# Patient Record
Sex: Female | Born: 1970 | Race: Black or African American | Hispanic: No | Marital: Single | Smoking: Former smoker
Health system: Southern US, Community
[De-identification: ages and names within clinical notes are randomized; demographics above are authoritative.]

## PROBLEM LIST (undated history)

## (undated) DIAGNOSIS — N760 Acute vaginitis: Secondary | ICD-10-CM

## (undated) DIAGNOSIS — A599 Trichomoniasis, unspecified: Secondary | ICD-10-CM

## (undated) DIAGNOSIS — F419 Anxiety disorder, unspecified: Secondary | ICD-10-CM

## (undated) DIAGNOSIS — J302 Other seasonal allergic rhinitis: Secondary | ICD-10-CM

## (undated) DIAGNOSIS — B3731 Acute candidiasis of vulva and vagina: Secondary | ICD-10-CM

## (undated) DIAGNOSIS — F32A Depression, unspecified: Secondary | ICD-10-CM

## (undated) DIAGNOSIS — F329 Major depressive disorder, single episode, unspecified: Secondary | ICD-10-CM

## (undated) DIAGNOSIS — K259 Gastric ulcer, unspecified as acute or chronic, without hemorrhage or perforation: Secondary | ICD-10-CM

## (undated) DIAGNOSIS — B9689 Other specified bacterial agents as the cause of diseases classified elsewhere: Secondary | ICD-10-CM

## (undated) DIAGNOSIS — I10 Essential (primary) hypertension: Secondary | ICD-10-CM

## (undated) DIAGNOSIS — B373 Candidiasis of vulva and vagina: Secondary | ICD-10-CM

## (undated) HISTORY — DX: Gastric ulcer, unspecified as acute or chronic, without hemorrhage or perforation: K25.9

## (undated) HISTORY — PX: OTHER SURGICAL HISTORY: SHX169

## (undated) HISTORY — PX: TUBAL LIGATION: SHX77

---

## 1998-03-07 ENCOUNTER — Encounter: Admission: RE | Admit: 1998-03-07 | Discharge: 1998-03-07 | Payer: Self-pay | Admitting: Obstetrics & Gynecology

## 1998-07-17 ENCOUNTER — Emergency Department (HOSPITAL_COMMUNITY): Admission: EM | Admit: 1998-07-17 | Discharge: 1998-07-17 | Payer: Self-pay | Admitting: Emergency Medicine

## 1998-07-17 ENCOUNTER — Encounter: Payer: Self-pay | Admitting: Emergency Medicine

## 1998-09-21 ENCOUNTER — Encounter: Admission: RE | Admit: 1998-09-21 | Discharge: 1998-09-21 | Payer: Self-pay | Admitting: Obstetrics

## 1999-03-20 ENCOUNTER — Encounter: Admission: RE | Admit: 1999-03-20 | Discharge: 1999-03-20 | Payer: Self-pay | Admitting: Obstetrics & Gynecology

## 1999-08-30 ENCOUNTER — Emergency Department (HOSPITAL_COMMUNITY): Admission: EM | Admit: 1999-08-30 | Discharge: 1999-08-30 | Payer: Self-pay | Admitting: Emergency Medicine

## 1999-08-30 ENCOUNTER — Encounter: Payer: Self-pay | Admitting: Emergency Medicine

## 1999-09-05 ENCOUNTER — Encounter: Admission: RE | Admit: 1999-09-05 | Discharge: 1999-09-05 | Payer: Self-pay | Admitting: Internal Medicine

## 1999-09-12 ENCOUNTER — Encounter: Admission: RE | Admit: 1999-09-12 | Discharge: 1999-09-12 | Payer: Self-pay | Admitting: Internal Medicine

## 2000-03-02 ENCOUNTER — Emergency Department (HOSPITAL_COMMUNITY): Admission: EM | Admit: 2000-03-02 | Discharge: 2000-03-02 | Payer: Self-pay | Admitting: *Deleted

## 2000-06-03 ENCOUNTER — Encounter: Admission: RE | Admit: 2000-06-03 | Discharge: 2000-06-03 | Payer: Self-pay | Admitting: Obstetrics & Gynecology

## 2000-07-29 ENCOUNTER — Encounter: Admission: RE | Admit: 2000-07-29 | Discharge: 2000-07-29 | Payer: Self-pay | Admitting: Obstetrics & Gynecology

## 2000-07-29 ENCOUNTER — Other Ambulatory Visit: Admission: RE | Admit: 2000-07-29 | Discharge: 2000-07-29 | Payer: Self-pay | Admitting: Obstetrics & Gynecology

## 2000-10-23 ENCOUNTER — Encounter: Admission: RE | Admit: 2000-10-23 | Discharge: 2000-10-23 | Payer: Self-pay | Admitting: Hematology and Oncology

## 2001-08-18 ENCOUNTER — Encounter: Payer: Self-pay | Admitting: Emergency Medicine

## 2001-08-18 ENCOUNTER — Emergency Department (HOSPITAL_COMMUNITY): Admission: EM | Admit: 2001-08-18 | Discharge: 2001-08-18 | Payer: Self-pay | Admitting: Emergency Medicine

## 2001-08-25 ENCOUNTER — Encounter: Admission: RE | Admit: 2001-08-25 | Discharge: 2001-08-25 | Payer: Self-pay | Admitting: *Deleted

## 2001-08-25 ENCOUNTER — Other Ambulatory Visit: Admission: RE | Admit: 2001-08-25 | Discharge: 2001-08-25 | Payer: Self-pay

## 2001-08-25 ENCOUNTER — Encounter (INDEPENDENT_AMBULATORY_CARE_PROVIDER_SITE_OTHER): Payer: Self-pay | Admitting: *Deleted

## 2001-12-10 ENCOUNTER — Encounter (INDEPENDENT_AMBULATORY_CARE_PROVIDER_SITE_OTHER): Payer: Self-pay | Admitting: *Deleted

## 2001-12-10 ENCOUNTER — Other Ambulatory Visit: Admission: RE | Admit: 2001-12-10 | Discharge: 2001-12-10 | Payer: Self-pay | Admitting: Obstetrics and Gynecology

## 2001-12-10 ENCOUNTER — Encounter: Admission: RE | Admit: 2001-12-10 | Discharge: 2001-12-10 | Payer: Self-pay | Admitting: *Deleted

## 2001-12-31 ENCOUNTER — Encounter: Admission: RE | Admit: 2001-12-31 | Discharge: 2001-12-31 | Payer: Self-pay | Admitting: Obstetrics and Gynecology

## 2002-02-16 ENCOUNTER — Ambulatory Visit (HOSPITAL_COMMUNITY): Admission: RE | Admit: 2002-02-16 | Discharge: 2002-02-16 | Payer: Self-pay | Admitting: *Deleted

## 2002-02-16 ENCOUNTER — Encounter (INDEPENDENT_AMBULATORY_CARE_PROVIDER_SITE_OTHER): Payer: Self-pay | Admitting: Specialist

## 2002-03-11 ENCOUNTER — Encounter: Admission: RE | Admit: 2002-03-11 | Discharge: 2002-03-11 | Payer: Self-pay | Admitting: Obstetrics and Gynecology

## 2002-06-10 ENCOUNTER — Other Ambulatory Visit: Admission: RE | Admit: 2002-06-10 | Discharge: 2002-06-10 | Payer: Self-pay | Admitting: *Deleted

## 2002-06-10 ENCOUNTER — Encounter: Admission: RE | Admit: 2002-06-10 | Discharge: 2002-06-10 | Payer: Self-pay | Admitting: *Deleted

## 2003-06-30 ENCOUNTER — Encounter: Admission: RE | Admit: 2003-06-30 | Discharge: 2003-06-30 | Payer: Self-pay | Admitting: Obstetrics and Gynecology

## 2003-12-16 ENCOUNTER — Encounter: Admission: RE | Admit: 2003-12-16 | Discharge: 2003-12-16 | Payer: Self-pay | Admitting: Family Medicine

## 2003-12-20 ENCOUNTER — Encounter: Admission: RE | Admit: 2003-12-20 | Discharge: 2003-12-20 | Payer: Self-pay | Admitting: Sports Medicine

## 2003-12-29 ENCOUNTER — Encounter: Admission: RE | Admit: 2003-12-29 | Discharge: 2003-12-29 | Payer: Self-pay | Admitting: Family Medicine

## 2004-01-11 ENCOUNTER — Encounter: Admission: RE | Admit: 2004-01-11 | Discharge: 2004-01-11 | Payer: Self-pay | Admitting: Family Medicine

## 2004-10-11 ENCOUNTER — Emergency Department (HOSPITAL_COMMUNITY): Admission: EM | Admit: 2004-10-11 | Discharge: 2004-10-11 | Payer: Self-pay | Admitting: Family Medicine

## 2004-12-25 ENCOUNTER — Encounter (INDEPENDENT_AMBULATORY_CARE_PROVIDER_SITE_OTHER): Payer: Self-pay | Admitting: *Deleted

## 2004-12-26 ENCOUNTER — Ambulatory Visit: Payer: Self-pay | Admitting: Family Medicine

## 2004-12-26 ENCOUNTER — Other Ambulatory Visit: Admission: RE | Admit: 2004-12-26 | Discharge: 2004-12-26 | Payer: Self-pay | Admitting: Family Medicine

## 2004-12-26 ENCOUNTER — Encounter (INDEPENDENT_AMBULATORY_CARE_PROVIDER_SITE_OTHER): Payer: Self-pay | Admitting: *Deleted

## 2005-01-01 ENCOUNTER — Emergency Department (HOSPITAL_COMMUNITY): Admission: EM | Admit: 2005-01-01 | Discharge: 2005-01-01 | Payer: Self-pay | Admitting: Family Medicine

## 2005-02-25 ENCOUNTER — Emergency Department (HOSPITAL_COMMUNITY): Admission: EM | Admit: 2005-02-25 | Discharge: 2005-02-25 | Payer: Self-pay | Admitting: Family Medicine

## 2005-05-05 ENCOUNTER — Emergency Department (HOSPITAL_COMMUNITY): Admission: EM | Admit: 2005-05-05 | Discharge: 2005-05-05 | Payer: Self-pay | Admitting: Emergency Medicine

## 2005-05-27 ENCOUNTER — Ambulatory Visit: Payer: Self-pay | Admitting: Sports Medicine

## 2005-11-19 ENCOUNTER — Encounter: Admission: RE | Admit: 2005-11-19 | Discharge: 2005-11-19 | Payer: Self-pay | Admitting: Sports Medicine

## 2005-11-19 ENCOUNTER — Ambulatory Visit: Payer: Self-pay | Admitting: Family Medicine

## 2006-09-05 ENCOUNTER — Emergency Department (HOSPITAL_COMMUNITY): Admission: EM | Admit: 2006-09-05 | Discharge: 2006-09-05 | Payer: Self-pay | Admitting: Family Medicine

## 2006-09-18 DIAGNOSIS — L509 Urticaria, unspecified: Secondary | ICD-10-CM | POA: Insufficient documentation

## 2006-09-19 ENCOUNTER — Encounter (INDEPENDENT_AMBULATORY_CARE_PROVIDER_SITE_OTHER): Payer: Self-pay | Admitting: *Deleted

## 2006-11-19 ENCOUNTER — Emergency Department (HOSPITAL_COMMUNITY): Admission: EM | Admit: 2006-11-19 | Discharge: 2006-11-19 | Payer: Self-pay | Admitting: Family Medicine

## 2007-02-01 ENCOUNTER — Emergency Department (HOSPITAL_COMMUNITY): Admission: EM | Admit: 2007-02-01 | Discharge: 2007-02-01 | Payer: Self-pay | Admitting: Emergency Medicine

## 2007-02-02 ENCOUNTER — Emergency Department (HOSPITAL_COMMUNITY): Admission: EM | Admit: 2007-02-02 | Discharge: 2007-02-02 | Payer: Self-pay | Admitting: Emergency Medicine

## 2007-06-02 ENCOUNTER — Encounter (INDEPENDENT_AMBULATORY_CARE_PROVIDER_SITE_OTHER): Payer: Self-pay | Admitting: Family Medicine

## 2007-06-02 ENCOUNTER — Ambulatory Visit: Payer: Self-pay | Admitting: Family Medicine

## 2007-06-02 ENCOUNTER — Other Ambulatory Visit: Admission: RE | Admit: 2007-06-02 | Discharge: 2007-06-02 | Payer: Self-pay | Admitting: Family Medicine

## 2007-06-02 DIAGNOSIS — B070 Plantar wart: Secondary | ICD-10-CM | POA: Insufficient documentation

## 2008-01-25 ENCOUNTER — Emergency Department (HOSPITAL_COMMUNITY): Admission: EM | Admit: 2008-01-25 | Discharge: 2008-01-25 | Payer: Self-pay | Admitting: Emergency Medicine

## 2008-02-25 ENCOUNTER — Emergency Department (HOSPITAL_COMMUNITY): Admission: EM | Admit: 2008-02-25 | Discharge: 2008-02-25 | Payer: Self-pay | Admitting: Emergency Medicine

## 2009-04-08 ENCOUNTER — Encounter (INDEPENDENT_AMBULATORY_CARE_PROVIDER_SITE_OTHER): Payer: Self-pay | Admitting: *Deleted

## 2009-04-08 DIAGNOSIS — F17200 Nicotine dependence, unspecified, uncomplicated: Secondary | ICD-10-CM | POA: Insufficient documentation

## 2009-08-08 ENCOUNTER — Emergency Department (HOSPITAL_COMMUNITY): Admission: EM | Admit: 2009-08-08 | Discharge: 2009-08-08 | Payer: Self-pay | Admitting: Emergency Medicine

## 2009-10-06 ENCOUNTER — Emergency Department (HOSPITAL_COMMUNITY): Admission: EM | Admit: 2009-10-06 | Discharge: 2009-10-06 | Payer: Self-pay | Admitting: Family Medicine

## 2009-10-06 ENCOUNTER — Emergency Department (HOSPITAL_COMMUNITY): Admission: EM | Admit: 2009-10-06 | Discharge: 2009-10-06 | Payer: Self-pay | Admitting: Emergency Medicine

## 2009-10-23 ENCOUNTER — Encounter: Payer: Self-pay | Admitting: Family Medicine

## 2009-10-23 ENCOUNTER — Ambulatory Visit: Payer: Self-pay | Admitting: Family Medicine

## 2009-10-23 ENCOUNTER — Other Ambulatory Visit: Admission: RE | Admit: 2009-10-23 | Discharge: 2009-10-23 | Payer: Self-pay | Admitting: Family Medicine

## 2009-10-23 DIAGNOSIS — E669 Obesity, unspecified: Secondary | ICD-10-CM | POA: Insufficient documentation

## 2009-10-23 LAB — CONVERTED CEMR LAB
Calcium: 8.9 mg/dL (ref 8.4–10.5)
Chlamydia, DNA Probe: NEGATIVE
Cholesterol: 147 mg/dL (ref 0–200)
Creatinine, Ser: 0.85 mg/dL (ref 0.40–1.20)
GC Probe Amp, Genital: NEGATIVE
HDL: 51 mg/dL (ref >39)
Sodium: 139 meq/L (ref 135–145)
Total CHOL/HDL Ratio: 2.9
Triglycerides: 120 mg/dL (ref <150)

## 2009-10-24 ENCOUNTER — Encounter: Payer: Self-pay | Admitting: Family Medicine

## 2009-10-30 ENCOUNTER — Encounter: Payer: Self-pay | Admitting: Family Medicine

## 2009-12-13 ENCOUNTER — Emergency Department (HOSPITAL_COMMUNITY): Admission: EM | Admit: 2009-12-13 | Discharge: 2009-12-13 | Payer: Self-pay | Admitting: Emergency Medicine

## 2009-12-18 ENCOUNTER — Emergency Department (HOSPITAL_COMMUNITY): Admission: EM | Admit: 2009-12-18 | Discharge: 2009-12-18 | Payer: Self-pay | Admitting: Emergency Medicine

## 2010-03-06 ENCOUNTER — Ambulatory Visit: Payer: Self-pay | Admitting: Family Medicine

## 2010-08-21 NOTE — Assessment & Plan Note (Signed)
Summary: hives,df   Vital Signs:  Patient profile:   40 year old female Weight:      186.8 pounds Temp:     98.7 degrees F oral Pulse rate:   94 / minute Pulse rhythm:   regular BP sitting:   132 / 84  (right arm) Cuff size:   regular  Vitals Entered By: Loralee Pacas CMA (March 06, 2010 3:30 PM) CC: hives   Primary Care Provider:  . [facility]  CC:  hives.  History of Present Illness: 40 yo here for evaluation of chronic urtucaria  has had hives for 3 mnths per patient- says she has been to the ER twice for this and received oral steroids.  She said the last time was several months ago.  On Debera Lat- last seen in May.    She takes claritin and occaisionally ranitidine for this which releives the hives.  Was not sure if it was safe to continue taking.  Has changes all soaps and detergents without help.  Cannot tell if triggered by environment.  No oral swelling, SOB.  PMH-FH-SH reviewed for relevance  Review of Systems      See HPI  Physical Exam  General:  Well-developed,well-nourished,in no acute distress; alert,appropriate and cooperative throughout examination Skin:  mild hives notes on upper arms.  no excoriatioin   Impression & Recommendations:  Problem # 1:  URTICARIA-HIVES (ICD-708.9)  Chronic intermittant for several months in duration.  Advised to continue claritin since this fully relieves her rash.  May discontinue ranitidine if she wants.  Disucssed natural course and will not likely know trigger, but may self resolve in time.  Given red flags for urgent evaluation.  Orders: [facility]- Est Level  3 (04540)  Complete Medication List: 1)  Claritin 10 Mg Caps (Loratadine)

## 2010-08-21 NOTE — Assessment & Plan Note (Signed)
Summary: cpp/eo   Vital Signs:  Patient profile:   40 year old female Height:      64.5 inches Weight:      189.1 pounds BMI:     32.07 Temp:     98.2 degrees F oral Pulse rate:   71 / minute BP sitting:   121 / 78  (left arm) Cuff size:   regular  Vitals Entered By: Gladstone Pih (October 23, 2009 10:39 AM) fCC: CPE,PAP, boil under arm, place on foot Is Patient Diabetic? No Pain Assessment Patient in pain? no        CC:  CPE, PAP, boil under arm, and place on foot.  History of Present Illness: Here for cpe/pap.  also discussed:  1.  place on foot--thinks it is callous, but it hurts.  been there for a long time  2.  bump under left arm--about 1 week.  hurts, not red.  no fevers.  had abscess under other arm that was drained--not sure how long ago.    been putting hot compresses on it and it is gettting better.    3.  weight gain--bmi has increased to obesity range.  has strong family hx of diabetes.    Habits & Providers  Alcohol-Tobacco-Diet     Tobacco Status: quit     Tobacco Counseling: to quit use of tobacco products     Year Quit: 10/2009  Current Medications (verified): 1)  None  Past History:  Past Medical History: h/o depression--on lexapro in past hx of urticaria? took claritin daily , but better now sickle cell trait learning disability--receives ss disability for this  Past Surgical History: Tubal ligation - 03/23/1991 cryotherapy of cervix around 2002  Family History: 1 brother, 3 sisters, mother, daughter - DM F - HTN,  M - DM, CVA cancer PGM--not sure what type of cancer  Social History: Single, 2 sons, 3 daughters lives with 1 son, other children grown currently sexually active  quit smoking (did smoke 1ppd for 3 years) not working right nowSmoking Status:  quit  Physical Exam  General:  Well-developed,well-nourished,in no acute distress; alert,appropriate and cooperative throughout examination Eyes:  fundoscopic exam grossly  normal Ears:  tms normal Mouth:  Oral mucosa and oropharynx without lesions or exudates.   Neck:  No deformities, masses, or tenderness noted. Breasts:  No mass, nodules, thickening, tenderness, bulging, retraction, inflamation, nipple discharge or skin changes noted.   Lungs:  Normal respiratory effort, chest expands symmetrically. Lungs are clear to auscultation, no crackles or wheezes. Heart:  Normal rate and regular rhythm. S1 and S2 normal without gallop, murmur, click, rub or other extra sounds. Abdomen:  Bowel sounds positive,abdomen soft and non-tender without masses, organomegaly or hernias noted. Genitalia:  Pelvic Exam:        External: normal female genitalia without lesions or masses        Vagina: normal without lesions or masses        Cervix: normal without lesions or masses        Adnexa: normal bimanual exam without masses or fullness        Uterus: normal by palpation        Pap smear: performed Pulses:  2+ dp pulses Extremities:  No clubbing, cyanosis, edema, or deformity noted  Neurologic:  no focal deficits Skin:  left foot:  calloused skin at the heel.  plantar wart on heel  left axilla--indurated abscess (about 2X 4 cm).  no fluctuance.  mildly ttp.  no  surrounding erythema.  no drainage Axillary Nodes:  No palpable lymphadenopathy Psych:  Cognition and judgment appear intact. Alert and cooperative with normal attention span and concentration. No apparent delusions, illusions, hallucinations Additional Exam:  vital signs reviewed    Impression & Recommendations:  Problem # 1:  OBESITY (ICD-278.00) Assessment Deteriorated actually in obese range now.  encouraged exercise and dietary changes.  need to screen for diabetes.    Problem # 2:  PLANTAR WART (ICD-078.12) Assessment: Unchanged advised otc compound W.    Problem # 3:  Preventive Health Care (ICD-V70.0) pap and std testing.  advised to get mammogram  Other Orders: Basic Met-[facility]  [phone]) Lipid-[facility] [phone]) Pap Smear-[facility] ([id number]) RPR-[facility] [id number]) HIV-[facility] [phone]) GC/Chlamydia-[facility] ([id number]) [facility] - Est  40-64 yrs (02725)  Patient Instructions: 1)  It was nice to see you today. 2)  I will call you or send you a letter with your lab results. 3)  If the bump under your arm gets worse or if it continues to bother you, make a follow up appointment and we can drain it.  If you get a fever or if it gets bigger or more painful, seek medical care immediately. 4)  Make an appointment for your mammogram 5)  Please schedule a follow-up appointment in 1 year for your physical.

## 2010-08-21 NOTE — Letter (Signed)
Summary: Generic Letter  [facility]  [address]   [place], [place] [address]   Phone: [phone]  Fax: [phone]    10/24/2009  Cathy Bond [address] [place], [place]  [address]  Dear [name],   I just wanted to let you know that your lab results were normal.  Your sugar is normal and your cholesterol is great!  Please call me if you have any questions or concerns.           Sincerely,   Asher Muir MD  Appended Document: Generic Letter pt letter mailed

## 2010-08-21 NOTE — Letter (Signed)
Summary: Generic Letter  [facility]  [address]   [place], [place] [address]   Phone: [phone]  Fax: [phone]    10/30/2009  JO-ANNE KLUTH [address] [place], [place]  [address]  Dear Ms. Plourde,  I just wanted to let you know that your pap smear was normal.  Please call me if you have any questions or concerns.             Sincerely,   Asher Muir MD  Appended Document: Generic Letter mailed.

## 2010-09-07 ENCOUNTER — Encounter: Payer: Self-pay | Admitting: *Deleted

## 2010-10-08 LAB — URINE CULTURE
Colony Count: NO GROWTH
Culture: NO GROWTH

## 2010-10-08 LAB — POCT URINALYSIS DIP (DEVICE)
Bilirubin Urine: NEGATIVE
Glucose, UA: NEGATIVE mg/dL
Ketones, ur: 15 mg/dL — AB
Nitrite: NEGATIVE
Specific Gravity, Urine: 1.02 (ref 1.005–1.030)

## 2010-10-08 LAB — WET PREP, GENITAL

## 2010-10-15 LAB — URINE MICROSCOPIC-ADD ON

## 2010-10-15 LAB — URINALYSIS, ROUTINE W REFLEX MICROSCOPIC
Nitrite: NEGATIVE
Specific Gravity, Urine: 1.02 (ref 1.005–1.030)
Urobilinogen, UA: 0.2 mg/dL (ref 0.0–1.0)

## 2010-10-15 LAB — COMPREHENSIVE METABOLIC PANEL
ALT: 11 U/L (ref 0–35)
AST: 15 U/L (ref 0–37)
CO2: 26 mEq/L (ref 19–32)
Calcium: 8.9 mg/dL (ref 8.4–10.5)
GFR calc Af Amer: >60 mL/min (ref >60)
GFR calc non Af Amer: >60 mL/min (ref >60)
Potassium: 3.6 mEq/L (ref 3.5–5.1)
Sodium: 139 mEq/L (ref 135–145)

## 2010-10-15 LAB — POCT CARDIAC MARKERS
CKMB, poc: <1 ng/mL — ABNORMAL LOW (ref 1.0–8.0)
Troponin i, poc: <0.05 ng/mL (ref 0.00–0.09)

## 2010-10-18 ENCOUNTER — Encounter: Payer: Self-pay | Admitting: Family Medicine

## 2010-10-18 ENCOUNTER — Other Ambulatory Visit (HOSPITAL_COMMUNITY)
Admission: RE | Admit: 2010-10-18 | Discharge: 2010-10-18 | Disposition: A | Discharge status: Home / Self Care | Payer: Medicaid Other | Source: Ambulatory Visit | Attending: Family Medicine | Admitting: Family Medicine

## 2010-10-18 ENCOUNTER — Ambulatory Visit (INDEPENDENT_AMBULATORY_CARE_PROVIDER_SITE_OTHER): Payer: Medicaid Other | Admitting: Family Medicine

## 2010-10-18 VITALS — BP 150/94 | HR 80 | Temp 98.4°F | Ht 64.75 in | Wt 185.0 lb

## 2010-10-18 DIAGNOSIS — Z124 Encounter for screening for malignant neoplasm of cervix: Secondary | ICD-10-CM

## 2010-10-18 DIAGNOSIS — L600 Ingrowing nail: Secondary | ICD-10-CM

## 2010-10-18 DIAGNOSIS — Z01419 Encounter for gynecological examination (general) (routine) without abnormal findings: Secondary | ICD-10-CM | POA: Insufficient documentation

## 2010-10-18 DIAGNOSIS — J301 Allergic rhinitis due to pollen: Secondary | ICD-10-CM

## 2010-10-18 MED ORDER — MOMETASONE FUROATE 50 MCG/ACT NA SUSP: 2.0000 | Freq: Every day | NASAL | Status: DC

## 2010-10-18 NOTE — Assessment & Plan Note (Signed)
Will start a nasal steroid and rec switching to zyrtec.  No current evidence of infection, although her recent discontinuation of smoking does make this determination somewhat difficult.  Told of red flags for infection and instructed to return if symptoms not improved in 2-3 weeks.

## 2010-10-18 NOTE — Progress Notes (Signed)
  Subjective:     Cathy Bond is a 40 y.o. female and is here for a comprehensive physical exam. The patient reports problems - quit smoking 3 weeks ago, has been having problems with rhinorrhea, fatigue, red eyes, and intermittent headaches.  Also left great toe has been having problems with being ingrown for the past 3-4 months..  History   Social History  . Marital Status: Single    Spouse Name: N/A    Number of Children: N/A  . Years of Education: N/A   Occupational History  . Not on file.   Social History Main Topics  . Smoking status: Former Games developer  . Smokeless tobacco: Not on file  . Alcohol Use: Not on file  . Drug Use: No  . Sexually Active: Yes    Birth Control/ Protection: Surgical   Other Topics Concern  . Not on file   Social History Narrative  . No narrative on file   Health Maintenance  Topic Date Due  . Pap Smear  04/25/1987  . Tetanus/tdap  12/24/2013    The following portions of the patient's history were reviewed and updated as appropriate: allergies, current medications, past family history, past medical history, past social history, past surgical history and problem list.  Review of Systems Pertinent items are noted in HPI.   Objective:    BP 150/94  Pulse 80  Temp(Src) 98.4 F (36.9 C) (Oral)  Ht 5' 4.75" (1.645 m)  Wt 185 lb (83.915 kg)  BMI 31.02 kg/m2  LMP 10/06/2010 General appearance: alert, cooperative and appears stated age Head: Normocephalic, without obvious abnormality, atraumatic Eyes: conjunctivae/corneas clear. PERRL, EOM's intact. Fundi benign. Ears: normal TM's and external ear canals both ears Nose: right turbinate red, left turbinate red, some nasal discharge Throat: abnormal findings: slightly erythematous pharynx Neck: no adenopathy, no carotid bruit, no JVD, supple, symmetrical, trachea midline and thyroid not enlarged, symmetric, no tenderness/mass/nodules Lungs: clear to auscultation bilaterally Heart: regular rate  and rhythm, S1, S2 normal, no murmur, click, rub or gallop Pelvic: cervix normal in appearance, external genitalia normal, no adnexal masses or tenderness, no cervical motion tenderness, uterus normal size, shape, and consistency, vagina normal without discharge and Pap smear performed   Right great toe has significant curvature, some pain on deep palpation of nail.  No erythema/discharge noted.  Nail is somewhat thickened.   Assessment:    Healthy female exam. RTC 1 year or as needed.     Plan:     See After Visit Summary for Counseling Recommendations  Will recheck blood pressure at next visit.  Feel that mild elevation is related to stopping smoking three weeks ago.  Will need to be followed up.

## 2010-10-18 NOTE — Assessment & Plan Note (Signed)
Will recommend soaking/lifting nail, and pulling skin away.  No current signs of infection.  Instructed patient to call for partial avulsion if not improving within 2 weeks or if begins to experience problems with redness, swelling, or discharge.

## 2010-10-18 NOTE — Patient Instructions (Signed)
It was great to see you today! I am writing you a prescription for your allergies.  It is a nasal steroid spray.  Use it once daily, two sprays each nostril.  It may take 3-5 days for you to see the full effect.  You can also try switching from Claritin to Zyrtec.  This may help some with your symptoms. Try soaking your nail every day and lifting the edge of the nail.  We are trying to encourage the nail to stop growing in on itself.  If, after about two weeks, you are not doing better, let us know and we can schedule you to remove the side of the nail. I will call you if the results of your pap smear are abnormal, otherwise you will get a letter.

## 2010-10-30 ENCOUNTER — Encounter: Payer: Self-pay | Admitting: Family Medicine

## 2010-12-07 NOTE — Consult Note (Signed)
House. [facility]  Patient:    Cathy Bond, Cathy Bond Visit Number: [id number] MRN: [id number]          Service Type: Attending:  Kristen Loader, M.D. Dictated by:   Kristen Loader, M.D.                            Consultation Report  HISTORY OF PRESENT ILLNESS:  The patient is a 40 year old, para 5-0-0-5, who had a colposcopy for atypical Pap smear.  She was found to have moderate dysplasia (CIN 1 to 2) and was brought back in for a consultation with her husband.  We discussed the alternative procedures.  The patient has opted for a LEEP procedure.  The possible complications have been discussed with the patient.  The history and course of dysplasia were also discussed.  The patient, somewhat nervous, prefers a hospital procedure.  The colposcopy showed, besides the dysplasia, multiple nabothian cysts which also could be easily removed with the LEEP procedure.  The patient said she rather have some sedation than just be injected here in the clinic and so we are going to accommodate her.  We are going to schedule the LEEP conization at the [facility].  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 Dictated by:   Kristen Loader, M.D. Attending:  Kristen Loader, M.D. DD:  12/31/01 TD:  01/02/02 Job: [id number]

## 2010-12-07 NOTE — Op Note (Signed)
   NAME:  Cathy Bond, Cathy Bond                         ACCOUNT NO.:  [id number]   MEDICAL RECORD NO.:  [id number]                   PATIENT TYPE:  AMB   LOCATION:  [facility]                                  [facility]   PHYSICIAN:  Phil D. Okey Dupre, M.D.                  DATE OF BIRTH:  Sep 09, 1970   DATE OF PROCEDURE:  02/16/2002  DATE OF DISCHARGE:  02/16/2002                                 OPERATIVE REPORT   PREOPERATIVE DIAGNOSIS:  Severe cervical dysplasia, pending pathology  report.   POSTOPERATIVE DIAGNOSIS:  Severe cervical dysplasia, pending pathology  report.   PROCEDURE:  Loupe electrical excision procedure and conization of the  cervix.   SURGEON:  Javier Glazier. Rose, M.D.   DESCRIPTION OF PROCEDURE:  Under satisfactory sedation with an insulated  Sims speculum, the cervix was isolated, treated with 3% acetic acid followed  by Lugol's solution.  The nonstaining areas were observed around the  transition zone and a 20 mm width by 12 mm depth loupe was used to take a  full cervical conization with 70 of #2 blend current.  Ball current of coag  #50 was used to control any bleeding in the area and also to coagulate the  edges of the biopsy in case a portion were missed.  Prior to the biopsy, the  cervix was injected at 11, 1, 3, 9, 5, and 7 o'clock with two-tenths of a  cubic centimeter of Xylocaine with 1:200,000 epinephrine.  The operative  site was then put under pressure for several more minutes with Monsel  solution.  There was minimal bleeding, less than 5 cc.  The patient was  transferred to the recovery room in satisfactory condition having tolerated  the procedure well.                                                  Phil D. Okey Dupre, M.D.    PDR/MEDQ  D:  02/16/2002  T:  02/18/2002  Job:  [id number]

## 2010-12-07 NOTE — Group Therapy Note (Signed)
NAME:  Cathy Bond, Cathy Bond                         ACCOUNT NO.:  [id number]   MEDICAL RECORD NO.:  [id number]                   PATIENT TYPE:  OUT   LOCATION:  [facility]                           FACILITY:  [facility]   PHYSICIAN:  Tinnie Gens, MD                     DATE OF BIRTH:  1970/12/14   DATE OF SERVICE:  06/30/2003                                    CLINIC NOTE   CHIEF COMPLAINT:  Annual exam.   HISTORY OF PRESENT ILLNESS:  The patient is a 40 year old G 5, P 5 who is  status post tubal ligation who comes in for a routine visit.  Apparently she  was seen previously in this clinic and had a history of dysplasia apparently  in July of last year, she underwent a LEEP at that time and now comes back  in for a repeat examination.  Her last Pap smear in November 2003 was within  normal limits.  She is still having regular menses.  Her only complaint  today is that she has had vaginal dryness.   PAST MEDICAL HISTORY:  Significant for tuberculosis.   PAST SURGICAL HISTORY:  BTL.   MEDICATIONS:  None.   No known allergies.   GYNECOLOGICAL HISTORY:  Regular periods, medium flow.  History of abnormal  Pap status post LEEP.  Status post tubal ligation.   OBSTETRICAL HISTORY:  G 5, P 5, SVD x5, the last 12 years ago.   FAMILY HISTORY:  Hypertension, cancer.   SOCIAL HISTORY:  She does drink alcohol approximately three times a week.  She is a stay-at-home mom.  She denies smoking or other drug use.   Her 15-point review of systems is significant for weight loss and dizzy  spells and vaginal dryness and pain with intercourse.  Otherwise her review  of systems was negative.  Please note she attributes her weight loss to a  decrease in her diet.  She has been trying to lose weight and the dizzy  spells have come since then.  She has markedly decreased her protein intake.  The pain of intercourse she does report vaginal dryness that seemed to be  better once intercourse was over.   PHYSICAL EXAMINATION:  VITAL SIGNS:  Blood pressure is 132/87, her weight is  168.6, her pulse is 77.  GENERAL:  She is well-developed, well-nourished black female in no acute  distress.  GENITOURINARY:  She has normal external female genitalia.  The vagina is  rugated and clean.  The cervix is multiparous and has a scar from previous  LEEP on it.  There is no cervical motion tenderness. The uterus is small and  anteverted.  The ovaries are nontender and there are no adnexal masses.   IMPRESSION:  1. Gynecological exam.  2. History of cervical dysplasia status post loop electrosurgical excision     procedure in July of 2003 with incomplete followup.  3. Vaginal  dryness.   PLAN:  1. Pap smear today and probably could be followed q. 6 months if this one is     normal.  2. I did discuss lubrication techniques and products that were available for     this patient.                                               Tinnie Gens, MD    TP/MEDQ  D:  06/30/2003  T:  06/30/2003  Job:  [id number]

## 2011-01-24 ENCOUNTER — Ambulatory Visit: Payer: Medicaid Other | Admitting: Family Medicine

## 2011-02-01 ENCOUNTER — Ambulatory Visit: Payer: Medicaid Other | Admitting: Family Medicine

## 2011-02-22 ENCOUNTER — Emergency Department (HOSPITAL_COMMUNITY)
Admission: EM | Admit: 2011-02-22 | Discharge: 2011-02-22 | Disposition: A | Discharge status: Home / Self Care | Payer: Medicare Other | Attending: Emergency Medicine | Admitting: Emergency Medicine

## 2011-02-22 DIAGNOSIS — D573 Sickle-cell trait: Secondary | ICD-10-CM | POA: Insufficient documentation

## 2011-02-22 DIAGNOSIS — H5710 Ocular pain, unspecified eye: Secondary | ICD-10-CM | POA: Insufficient documentation

## 2011-02-22 DIAGNOSIS — R3589 Other polyuria: Secondary | ICD-10-CM | POA: Insufficient documentation

## 2011-02-22 DIAGNOSIS — R42 Dizziness and giddiness: Secondary | ICD-10-CM | POA: Insufficient documentation

## 2011-02-22 DIAGNOSIS — R51 Headache: Secondary | ICD-10-CM | POA: Insufficient documentation

## 2011-02-22 DIAGNOSIS — R358 Other polyuria: Secondary | ICD-10-CM | POA: Insufficient documentation

## 2011-02-22 DIAGNOSIS — R631 Polydipsia: Secondary | ICD-10-CM | POA: Insufficient documentation

## 2011-02-22 LAB — DIFFERENTIAL
Basophils Relative: 1 % (ref 0–1)
Lymphs Abs: 2.3 10*3/uL (ref 0.7–4.0)
Monocytes Relative: 5 % (ref 3–12)
Neutro Abs: 3.7 10*3/uL (ref 1.7–7.7)
Neutrophils Relative %: 57 % (ref 43–77)

## 2011-02-22 LAB — BASIC METABOLIC PANEL
CO2: 27 mEq/L (ref 19–32)
Calcium: 9.2 mg/dL (ref 8.4–10.5)
Chloride: 103 mEq/L (ref 96–112)
Glucose, Bld: 89 mg/dL (ref 70–99)
Sodium: 137 mEq/L (ref 135–145)

## 2011-02-22 LAB — URINALYSIS, ROUTINE W REFLEX MICROSCOPIC
Glucose, UA: NEGATIVE mg/dL
Hgb urine dipstick: NEGATIVE
Specific Gravity, Urine: 1.013 (ref 1.005–1.030)
pH: 6 (ref 5.0–8.0)

## 2011-02-22 LAB — CBC
Hemoglobin: 12.6 g/dL (ref 12.0–15.0)
MCH: 27.8 pg (ref 26.0–34.0)
MCV: 78.6 fL (ref 78.0–100.0)
RBC: 4.53 MIL/uL (ref 3.87–5.11)

## 2011-02-26 ENCOUNTER — Encounter: Payer: Self-pay | Admitting: Family Medicine

## 2011-02-26 ENCOUNTER — Ambulatory Visit (INDEPENDENT_AMBULATORY_CARE_PROVIDER_SITE_OTHER): Payer: Medicaid Other | Admitting: Family Medicine

## 2011-02-26 VITALS — BP 130/80 | HR 82 | Temp 98.2°F | Wt 187.8 lb

## 2011-02-26 DIAGNOSIS — R5383 Other fatigue: Secondary | ICD-10-CM

## 2011-02-26 DIAGNOSIS — R300 Dysuria: Secondary | ICD-10-CM

## 2011-02-26 DIAGNOSIS — F319 Bipolar disorder, unspecified: Secondary | ICD-10-CM

## 2011-02-26 DIAGNOSIS — R5381 Other malaise: Secondary | ICD-10-CM

## 2011-02-26 DIAGNOSIS — N760 Acute vaginitis: Secondary | ICD-10-CM

## 2011-02-26 DIAGNOSIS — E669 Obesity, unspecified: Secondary | ICD-10-CM

## 2011-02-26 LAB — POCT URINALYSIS DIPSTICK
Bilirubin, UA: NEGATIVE
Blood, UA: NEGATIVE
Nitrite, UA: NEGATIVE
Protein, UA: NEGATIVE
pH, UA: 6.5

## 2011-02-26 LAB — POCT WET PREP (WET MOUNT)
Trichomonas Wet Prep HPF POC: NEGATIVE
Yeast Wet Prep HPF POC: NEGATIVE

## 2011-02-26 MED ORDER — QUETIAPINE FUMARATE 50 MG PO TABS: 50.0000 mg | ORAL_TABLET | Freq: Every day | ORAL | Status: AC

## 2011-02-26 MED ORDER — QUETIAPINE FUMARATE 300 MG PO TABS: 300.0000 mg | ORAL_TABLET | Freq: Every day | ORAL | Status: AC

## 2011-02-26 NOTE — Patient Instructions (Signed)
New medicine seroquel-  Make follow-up in 2-3 weeks  Manic Depressive Illness   (Bipolar Disorder) Bipolar disorder is also known as manic depressive illness. It is when the brain does not function properly and causes shifts in a person's moods, energy and ability to function in everyday life. These shifts are different from the normal ups and downs that everyone experiences. Instead the shifts are severe. If this goes untreated, the person's life becomes more and more disorderly. People with this disorder can be treated can lead full and productive lives. This disorder must be managed throughout life.   SYMPTOMS  Bipolar disorder causes dramatic mood swings. These mood swings go in cycles. They cycle from extreme "highs" and irritable to deep "lows" of sadness and hopelessness.   Between the extreme moods, there are usually periods of normal mood.   Along with the mood shifts, the person will have severe changes in energy and behavior. The periods of "highs" and "lows" are called episodes of mania and depression.  Signs of mania:  Lots of energy, activity and restlessness.   Extreme "high" or good mood.     Extreme irritability.     Racing thoughts and talking very fast.     Jumping from one idea to another.     Not able to focus, easily distracted.     Little need to sleep.     Grand beliefs in one's abilities and powers.     Spending sprees.     Increased sexual drive. This can result in many sexual partners.   Poor judgment.     Abuse of drugs, particularly cocaine, alcohol, and sleeping medication.     Aggressive or provocative behavior.     A lasting period of behavior that is different from usual.     Denial that anything is wrong.     *A manic episode is identified if a "high" mood happens with three or more of the other symptoms lasting most of the day, nearly everyday for a week or longer. If the mood is more irritable in nature, four additional symptoms must be  present. Signs of depression:  Lasting feelings of sadness, anxiety or empty mood.   Feelings of hopelessness with negative thoughts.     Feelings of guilt, worthlessness, or helplessness.     Loss of interest or pleasure in activities once enjoyed, including sex.     Feelings of fatigue or having less energy.     Trouble focusing, making decisions, remembering.     Feeling restless or irritable.   Sleeping too little or too much.     Change in eating with possible weight gain or loss.     Feeling ongoing pain that is not caused by physical illness or injury.     Thoughts of death or suicide or suicide attempts.     *A depressive episode is identified as having five or more of the above symptoms that last most of the day, nearly everyday for two weeks or longer. CAUSES  Research shows that there is no single cause for the disorder. Many factors act together to produce the illness.   This can be passed down from family (hereditary).   Environment may play a part.  TREATMENT  Long-term treatment is strongly recommended because bipolar disorder is a repeated illness. This disorder is better controlled if treatment is ongoing than if it is off and on.   A combination of medication and talk therapy is best for managing the disorder  over time.   Medication.   Medication can be prescribed by a doctor that is an expert in treating mental disorders (psychiatrists). Medications known as "mood stabilizers" are usually prescribed to help control the illness. Other medications can be added when needed. These medicines usually treat episodes of mania or depression that break through despite the mood stabilizer.   Talk Therapy.   Along with medication, some forms of talk therapy are helpful in providing support, education and guidance to people with the illness and their families. Studies show that this type of treatment increases mood stability, decreases need for hospitalization and  improves how they function society.   Electroconvulsive Therapy (ECT).   In extreme situations where the above treatments do not work or work too slowly to relieve severe symptoms, ECT may be considered.  Document Released: 10/14/2000 Document Re-Released: 10/15/2007 [facility] Patient Information 2011 [place], [place].

## 2011-02-27 LAB — CBC
HCT: 36.8 % (ref 36.0–46.0)
Hemoglobin: 12.6 g/dL (ref 12.0–15.0)
MCHC: 34.2 g/dL (ref 30.0–36.0)
WBC: 5.3 10*3/uL (ref 4.0–10.5)

## 2011-02-27 LAB — LIPID PANEL
HDL: 43 mg/dL (ref >39)
LDL Cholesterol: 70 mg/dL (ref 0–99)
Triglycerides: 50 mg/dL (ref <150)
VLDL: 10 mg/dL (ref 0–40)

## 2011-02-27 LAB — COMPREHENSIVE METABOLIC PANEL
AST: 12 U/L (ref 0–37)
BUN: 11 mg/dL (ref 6–23)
Calcium: 8.4 mg/dL (ref 8.4–10.5)
Chloride: 102 mEq/L (ref 96–112)
Creat: 0.97 mg/dL (ref 0.50–1.10)

## 2011-02-28 ENCOUNTER — Telehealth: Payer: Self-pay | Admitting: Family Medicine

## 2011-02-28 DIAGNOSIS — N390 Urinary tract infection, site not specified: Secondary | ICD-10-CM | POA: Insufficient documentation

## 2011-02-28 DIAGNOSIS — R300 Dysuria: Secondary | ICD-10-CM | POA: Insufficient documentation

## 2011-02-28 DIAGNOSIS — F319 Bipolar disorder, unspecified: Secondary | ICD-10-CM | POA: Insufficient documentation

## 2011-02-28 NOTE — Telephone Encounter (Signed)
Cathy Bond is returning Dr. Mellody Life call.

## 2011-02-28 NOTE — Assessment & Plan Note (Signed)
Will start seroquel, follow-up in 2-3 weeks.  Gave information for Dr. Pascal Lux, she feels therapy would be very beneficial in help with dealing with stress.

## 2011-02-28 NOTE — Assessment & Plan Note (Signed)
Normal u/a.  Will check fasting cbg to evaluate for polyuria.    Will check cervical cultures.

## 2011-02-28 NOTE — Progress Notes (Signed)
  Subjective:    Patient ID: Cathy Bond, female    DOB: 06-14-1971, 40 y.o.   MRN: [id number]  HPI  40 yo here dysuria.during course of visit, patient tearful, we discuss history of depression  DYSURIA  Onset: several days, went to ER for evaluation yesterday, neg u/a  Worsening: no    Symptoms Urgency: no Frequency: yes  Hesitancy: no  Hematuria: no  Flank Pain: yes  Fever: no   Highest Temp: n/a  Nausea/Vomiting: no  Pregnant: no STD exposure/history: no Discharge: no, but concerned about STD's Irritants: no Rash: no  Red Flags  : (Risk Factors for Complicated UTI) Recent Antibiotic Usage (last 30 days): no  Symptoms lasting more than seven (7) days: no  More than 3 UTI's last 12 months: no  PMH of  1. DM: no 2. Renal Disease/Calculi: no 3. Urinary Tract Abnormality: no  4. Instrumentation/Trauma: no 5. Immunosuppression: no  Depression:  Notes several months of worsening sadness, crying several times per week.  Notes house foreclosure and difficult social circumstances as triggers.  Has history of suicide attempt in teens resulting in a mental health admission.  Does not feel she has anyone trusted to talk to.  No SI, HI.  Symptoms reviewed with patient as per screening below: PHQ-9: 16, somewhat difficult BSDS: 2 checks, this story fits me very well or almost perfectly MDQ: 9 yes, serious problem GAD-7 15, somewhat difficult  Review of Systems see HPI     Objective:   Physical Exam  GEN: Alert & Oriented, No acute distress CV:  Regular Rate & Rhythm, no murmur Respiratory:  Normal work of breathing, CTAB Abd:  + BS, soft, no tenderness to palpation Ext: no pre-tibial edema Pelvic Exam:        External: normal female genitalia without lesions or masses        Vagina: normal without lesions or masses        Cervix: normal without lesions or masses        Adnexa: normal bimanual exam without masses or fullness        Uterus: normal by palpation  Samples for Wet prep, GC/Chlamydia obtained Psych: alert and oriented.  Tearful at times.  Depressed affect, answers questions appropriately.  Normal thought content.  No SI, HI.  No hallucinations      Assessment & Plan:

## 2011-02-28 NOTE — Telephone Encounter (Signed)
Called to check on patient and discuss treatment for BV.

## 2011-03-01 MED ORDER — METRONIDAZOLE 0.75 % VA GEL: 1.0000 | Freq: Every day | VAGINAL | Status: AC

## 2011-03-01 NOTE — Telephone Encounter (Signed)
Spoke with patient on diagnosis and natural course of BV, she opted for metrogel.  She states she has been titrating up seroquel but the 300 mg tablets were denied.  I spoke with pharmacy tech at her [facility] who stated that the patient does not have medicaid but in fact his medicare part D and it is covered.  They will have it ready for her to pick up.

## 2011-04-18 LAB — POCT PREGNANCY, URINE
Operator id: 235561
Preg Test, Ur: NEGATIVE

## 2011-04-18 LAB — POCT URINALYSIS DIP (DEVICE)
Glucose, UA: NEGATIVE
Nitrite: NEGATIVE
Operator id: 235561
Urobilinogen, UA: 0.2

## 2011-06-21 ENCOUNTER — Emergency Department (INDEPENDENT_AMBULATORY_CARE_PROVIDER_SITE_OTHER)
Admission: EM | Admit: 2011-06-21 | Discharge: 2011-06-21 | Disposition: A | Discharge status: Home / Self Care | Payer: Medicaid Other | Source: Home / Self Care | Attending: Emergency Medicine | Admitting: Emergency Medicine

## 2011-06-21 ENCOUNTER — Ambulatory Visit: Payer: Medicare Other | Admitting: Family Medicine

## 2011-06-21 ENCOUNTER — Encounter (HOSPITAL_COMMUNITY): Payer: Self-pay | Admitting: *Deleted

## 2011-06-21 DIAGNOSIS — K119 Disease of salivary gland, unspecified: Secondary | ICD-10-CM

## 2011-06-21 DIAGNOSIS — L602 Onychogryphosis: Secondary | ICD-10-CM

## 2011-06-21 DIAGNOSIS — L608 Other nail disorders: Secondary | ICD-10-CM

## 2011-06-21 HISTORY — DX: Depression, unspecified: F32.A

## 2011-06-21 HISTORY — DX: Major depressive disorder, single episode, unspecified: F32.9

## 2011-06-21 HISTORY — DX: Other seasonal allergic rhinitis: J30.2

## 2011-06-21 MED ORDER — DIPHENHYDRAMINE HCL 25 MG PO CAPS: 25.0000 mg | ORAL_CAPSULE | Freq: Four times a day (QID) | ORAL | Status: AC | PRN

## 2011-06-21 NOTE — ED Notes (Signed)
Pt  Has  A  Multitude  Of  Complaints    symptoms  Of bilateral  Foot  Pain  X  Several  Months    As  Well  As  Sensation of  Frequent  Spitting  Since  Cessation of  Smoking  Recently  She  Also  Says she  Has headaches  and  Constipation   She  Is  Ambulatory  And  Appears in no  distress

## 2011-06-21 NOTE — ED Provider Notes (Signed)
History     CSN: [id number] Arrival date & time: 06/21/2011  5:45 PM   First MD Initiated Contact with Patient 06/21/11 1709      Chief Complaint  Patient presents with  . Foot Pain    HPI Comments: Pt c/o increased salivation x 4 days since quitting smoking. States she has to spit frequently. No difficulty swallowing, no regurgitation, no ST, pain with swallowing, trouble talking, facial swelling, jaw pain. Pt also c/o bilateral big toe pain where her toenails have grown out and folded in. Has been going on for a month. Pain worse with wearing shoes. States is unable to cut her own toenails. No redness, fevers, inability to move toes.   Patient is a 40 y.o. female presenting with lower extremity pain. The history is provided by the patient.  Foot Pain This is a chronic problem. The current episode started more than 1 week ago. The problem occurs constantly. The problem has not changed since onset.The symptoms are aggravated by walking. Relieved by: wearing tight. She has tried nothing for the symptoms.    Past Medical History  Diagnosis Date  . Seasonal allergies   . Depression     Past Surgical History  Procedure Date  . Tubal ligation     Family History  Problem Relation Age of Onset  . Diabetes Mother   . Stroke Mother   . Depression Sister   . Alcohol abuse Sister   . Diabetes Sister   . Depression Brother   . Diabetes Brother     History  Substance Use Topics  . Smoking status: Former Smoker -- 2.0 packs/day    Types: Cigarettes  . Smokeless tobacco: Not on file   Comment: used patches before  . Alcohol Use: 1.2 oz/week    2 Cans of beer per week    OB History    Grav Para Term Preterm Abortions TAB SAB Ect Mult Living                  Review of Systems  Constitutional: Negative for fever.  HENT: Negative for sore throat, drooling, mouth sores, trouble swallowing and voice change.   Respiratory: Negative for choking and stridor.   Gastrointestinal:  Negative for nausea and vomiting.    Allergies  Review of patient's allergies indicates no known allergies.  Home Medications   Current Outpatient Rx  Name Route Sig Dispense Refill  . DIPHENHYDRAMINE HCL 25 MG PO CAPS Oral Take 1 capsule (25 mg total) by mouth every 6 (six) hours as needed. 30 capsule 0  . LORATADINE 10 MG PO CAPS Oral Take by mouth.      . MOMETASONE FUROATE 50 MCG/ACT NA SUSP Nasal 2 sprays by Nasal route daily. Two sprays each nostril daily 17 g 2    BP 137/93  Pulse 82  Temp(Src) 97.7 F (36.5 C) (Oral)  Resp 17  SpO2 99%  LMP 06/10/2011  Physical Exam  Nursing note and vitals reviewed. Constitutional: She is oriented to person, place, and time. She appears well-developed and well-nourished. No distress.  HENT:  Head: Normocephalic and atraumatic.  Right Ear: Tympanic membrane normal.  Left Ear: Tympanic membrane normal.  Nose: Nose normal.  Mouth/Throat: Uvula is midline, oropharynx is clear and moist and mucous membranes are normal.       No tenderness along salivary glands, no purulent discharge. No facial swelling. Dentition intact.   Eyes: EOM are normal. Pupils are equal, round, and reactive to light.  Neck:  Normal range of motion.  Cardiovascular: Regular rhythm.   Pulmonary/Chest: Effort normal and breath sounds normal.  Abdominal: She exhibits no distension.  Musculoskeletal: Normal range of motion.       Bilateral 1st toenail overgrown, thick, bent. No evidence of infection.   Lymphadenopathy:    She has no cervical adenopathy.  Neurological: She is alert and oriented to person, place, and time.  Skin: Skin is warm and dry.  Psychiatric: She has a normal mood and affect. Her behavior is normal. Judgment and thought content normal.    ED Course  Procedures (including critical care time)  Labs Reviewed - No data to display No results found.   1. Salivary disorder   2. Hypertrophic toenail       MDM    Luiz Blare,  MD 06/21/11 2218

## 2011-08-01 ENCOUNTER — Emergency Department (HOSPITAL_COMMUNITY)
Admission: EM | Admit: 2011-08-01 | Discharge: 2011-08-02 | Disposition: A | Discharge status: Home / Self Care | Payer: Medicare Other | Attending: Emergency Medicine | Admitting: Emergency Medicine

## 2011-08-01 ENCOUNTER — Other Ambulatory Visit: Payer: Self-pay

## 2011-08-01 ENCOUNTER — Encounter (HOSPITAL_COMMUNITY): Payer: Self-pay | Admitting: Emergency Medicine

## 2011-08-01 ENCOUNTER — Emergency Department (HOSPITAL_COMMUNITY): Payer: Medicare Other

## 2011-08-01 DIAGNOSIS — R079 Chest pain, unspecified: Secondary | ICD-10-CM | POA: Diagnosis not present

## 2011-08-01 DIAGNOSIS — R071 Chest pain on breathing: Secondary | ICD-10-CM | POA: Insufficient documentation

## 2011-08-01 DIAGNOSIS — F410 Panic disorder [episodic paroxysmal anxiety] without agoraphobia: Secondary | ICD-10-CM | POA: Insufficient documentation

## 2011-08-01 DIAGNOSIS — L509 Urticaria, unspecified: Secondary | ICD-10-CM | POA: Diagnosis not present

## 2011-08-01 HISTORY — DX: Anxiety disorder, unspecified: F41.9

## 2011-08-01 LAB — POCT I-STAT, CHEM 8
Calcium, Ion: 1.2 mmol/L (ref 1.12–1.32)
Chloride: 103 mEq/L (ref 96–112)
HCT: 40 % (ref 36.0–46.0)
Hemoglobin: 13.6 g/dL (ref 12.0–15.0)
Potassium: 3.7 mEq/L (ref 3.5–5.1)

## 2011-08-01 NOTE — ED Provider Notes (Signed)
History     CSN: [id number]  Arrival date & time 08/01/11  2028   First MD Initiated Contact with Patient 08/01/11 2316      Chief Complaint  Patient presents with  . Urticaria  . Anxiety  . Chest Wall Pain     (Consider location/radiation/quality/duration/timing/severity/associated sxs/prior treatment) HPI Comments: Patient reports she think she had a panic attack tonight.  States she had central chest pain, mild SOB, right arm tingling that began around 7:00 after she held her grandchild.  She also developed an itchy rash on her arms and chest that she said looked like hives and she was a little lightheaded.  The symptoms last an hour and a half.  States this felt like a panic attack she had in her 73s.  Patient states she has chest pain 1-2 times a week, usually when she is in bed.  States she has stairs where she lives and does not ever have chest pain or shortness of breath when walking up the stairs.    Patient is a 41 y.o. female presenting with urticaria and anxiety. The history is provided by the patient.  Urticaria Pertinent negatives include no abdominal pain, coughing, nausea or vomiting.  Anxiety Pertinent negatives include no abdominal pain, coughing, nausea or vomiting.    Past Medical History  Diagnosis Date  . Seasonal allergies   . Depression   . Anxiety     Past Surgical History  Procedure Date  . Tubal ligation   . Toe nail     Family History  Problem Relation Age of Onset  . Diabetes Mother   . Stroke Mother   . Depression Sister   . Alcohol abuse Sister   . Diabetes Sister   . Depression Brother   . Diabetes Brother     History  Substance Use Topics  . Smoking status: Former Smoker -- 2.0 packs/day    Types: Cigarettes  . Smokeless tobacco: Not on file   Comment: used patches before  . Alcohol Use: 1.2 oz/week    2 Cans of beer per week    OB History    Grav Para Term Preterm Abortions TAB SAB Ect Mult Living                   Review of Systems  Constitutional:       Increased thirst  Respiratory: Negative for cough, shortness of breath and wheezing.   Gastrointestinal: Negative for nausea, vomiting, abdominal pain and diarrhea.  Genitourinary: Positive for frequency. Negative for dysuria.  Psychiatric/Behavioral: Negative for suicidal ideas.       +depression  All other systems reviewed and are negative.    Allergies  Review of patient's allergies indicates no known allergies.  Home Medications   Current Outpatient Rx  Name Route Sig Dispense Refill  . LORATADINE 10 MG PO TABS Oral Take 10 mg by mouth daily.    . MOMETASONE FUROATE 50 MCG/ACT NA SUSP Nasal 2 sprays by Nasal route daily. Two sprays each nostril daily 17 g 2  . ADULT MULTIVITAMIN W/MINERALS CH Oral Take 1 tablet by mouth daily.      BP 122/83  Pulse 72  Temp(Src) 98.4 F (36.9 C) (Oral)  Resp 16  SpO2 100%  LMP 07/31/2011  Physical Exam  Nursing note and vitals reviewed. Constitutional: She is oriented to person, place, and time. She appears well-developed and well-nourished.  HENT:  Head: Normocephalic and atraumatic.  Neck: Neck supple.  Cardiovascular: Normal rate,  regular rhythm and normal heart sounds.   Pulmonary/Chest: Breath sounds normal. No respiratory distress. She has no wheezes. She has no rales. She exhibits no tenderness.  Musculoskeletal: She exhibits no edema and no tenderness.       Distal pulses intact in upper extremities  Neurological: She is alert and oriented to person, place, and time. She has normal strength. No sensory deficit.    ED Course  Procedures (including critical care time)  Labs Reviewed  POCT I-STAT, CHEM 8 - Abnormal; Notable for the following:    Glucose, Bld 123 (*)    All other components within normal limits  URINALYSIS, ROUTINE W REFLEX MICROSCOPIC  PREGNANCY, URINE  I-STAT, CHEM 8  TROPONIN I   Dg Chest 2 View  08/01/2011  *RADIOLOGY REPORT*  Clinical Data: Chest  pain.  CHEST - 2 VIEW  Comparison: None.  Findings:  Cardiopericardial silhouette within normal limits. Mediastinal contours normal. Trachea midline.  No airspace disease or effusion.  IMPRESSION: No active cardiopulmonary disease.  Original Report Authenticated By: Andreas Newport, M.D.    Date: 08/02/2011  Rate: 65  Rhythm: normal sinus rhythm  QRS Axis: normal  Intervals: normal  ST/T Wave abnormalities: normal  Conduction Disutrbances:none  Narrative Interpretation:   Old EKG Reviewed: unchanged  12:41 AM I called the laboratory - Annice Pih states they have no record of a urinalysis being ordered.     12:52 AM I called [facility], patient's PCP to let them know patient has had panic attack, has increased depression and occasional chest pain and to request they they check in with her and schedule a close follow up appointment.    12:59 AM atient continues to feel well, no complaints, no needs at this time.   1. Panic attack       MDM  Patient presented tonight with panic attack, increased stress and depression recently.  Workup unremarkable.  I have called patient's PCP who will follow closely with her.  Patient signed out to [facility] PA-C, pending urinalysis and urine pregnancy due to patient's recent light periods and urinary frequency.  Patient to be d/c home upon return of results.          Dillard Cannon [place], [place] 08/02/11 [phone]

## 2011-08-01 NOTE — ED Notes (Signed)
Pt presented to the ER with c/o chest wall pain secondary to increase of anxiety, pt also states that she "broke in hives", explains that when anxiety increases at times her skin reacts in that way, pt states that s/s started about 2hr. Pt also states that she has Hx of these s/s, pt further explains that she has some tightness in the chest area, mid area, states that at this time that feeling decreasing, no apparent distress notes nor reported by the pt.

## 2011-08-01 NOTE — ED Notes (Signed)
Per EMS pt c/o chest wall pain reproducible with palpation.

## 2011-08-02 ENCOUNTER — Ambulatory Visit: Payer: Medicaid Other | Admitting: Family Medicine

## 2011-08-02 LAB — URINALYSIS, ROUTINE W REFLEX MICROSCOPIC
Ketones, ur: NEGATIVE mg/dL
Leukocytes, UA: NEGATIVE
Nitrite: NEGATIVE
Specific Gravity, Urine: 1.026 (ref 1.005–1.030)
Urobilinogen, UA: 0.2 mg/dL (ref 0.0–1.0)
pH: 5.5 (ref 5.0–8.0)

## 2011-08-02 LAB — PREGNANCY, URINE: Preg Test, Ur: NEGATIVE

## 2011-08-02 LAB — TROPONIN I: Troponin I: <0.3 ng/mL (ref <0.30)

## 2011-08-02 NOTE — ED Provider Notes (Signed)
Swanson Farnell S 1:00 a.m. patient discussed in sign out with Trixie Dredge PAC. UA pending for symptoms of urinary frequency.  1:50 AM UA with no signs for UTI. There is hematuria. Patient currently menstruating. At this time will discharge home.    Angus Seller, [place] 08/02/11 [phone]

## 2011-08-02 NOTE — ED Provider Notes (Signed)
Medical screening examination/treatment/procedure(s) were performed by non-physician practitioner and as supervising physician I was immediately available for consultation/collaboration.   Hanley Seamen, MD 08/02/11 [phone]

## 2011-08-02 NOTE — ED Provider Notes (Signed)
Medical screening examination/treatment/procedure(s) were performed by non-physician practitioner and as supervising physician I was immediately available for consultation/collaboration.   Callan Norden L Maybree Riling, MD 08/02/11 0645 

## 2011-08-05 ENCOUNTER — Ambulatory Visit: Payer: Medicaid Other | Admitting: Family Medicine

## 2011-08-06 ENCOUNTER — Encounter: Payer: Self-pay | Admitting: Family Medicine

## 2011-08-06 ENCOUNTER — Ambulatory Visit (INDEPENDENT_AMBULATORY_CARE_PROVIDER_SITE_OTHER): Payer: Medicaid Other | Admitting: Family Medicine

## 2011-08-06 DIAGNOSIS — R030 Elevated blood-pressure reading, without diagnosis of hypertension: Secondary | ICD-10-CM

## 2011-08-06 DIAGNOSIS — IMO0001 Reserved for inherently not codable concepts without codable children: Secondary | ICD-10-CM

## 2011-08-06 DIAGNOSIS — M791 Myalgia, unspecified site: Secondary | ICD-10-CM

## 2011-08-06 DIAGNOSIS — F3289 Other specified depressive episodes: Secondary | ICD-10-CM

## 2011-08-06 DIAGNOSIS — F329 Major depressive disorder, single episode, unspecified: Secondary | ICD-10-CM

## 2011-08-06 DIAGNOSIS — F32A Depression, unspecified: Secondary | ICD-10-CM

## 2011-08-06 MED ORDER — BUPROPION HCL ER (SR) 150 MG PO TB12: 150.0000 mg | ORAL_TABLET | Freq: Two times a day (BID) | ORAL | Status: DC

## 2011-08-06 NOTE — Patient Instructions (Signed)
Take 1 pill in the morning for next 3 days. After that you start taking once in AM and once in PM.   Remember it takes about 7 - 10 days to really start noticing a difference. We'll check some blood work today and let you know the results.   Make an appointment to come back in 2 weeks so I can check on how you're doing.

## 2011-08-06 NOTE — Progress Notes (Signed)
  Subjective:    Patient ID: ILY DENNO, female    DOB: [birth year], 41 y.o.   MRN: [id number]  HPI 1.  Depression:  41 yo F who presents with FU from ED after being diagnosed with panic attack.  Tingling in arms and legs, intermittent chest pain at rest, feeling generally "run down" for 1-2 weeks prior to ED presentation.  Not prescribed anything.  Since then, she has been growing increasingly worried that something is wrong with her.  She notes continued tingling in arms and legs.  Feels general malaise, decreased interest in usual activities, decreased appetite.  Rarely leaves house.  On disability for what she terms learning disability, so she does not work.  No history of manic symptoms.  No fevers or chills.  Does describe generalized muscle aches and feels weaker than usual in arms and legs.     Review of Systems See HPI above for review of systems.       Objective:   Physical Exam Gen:  Alert, cooperative patient who appears stated age in no acute distress.  Vital signs reviewed.  Tearful during exam. HEENT:  [place]/AT.  EOMI, PERRL.  MMM, tonsils non-erythematous, non-edematous.  External ears WNL, Bilateral TM's normal without retraction, redness or bulging.  Neck:  No thyromegaly. Heart:  Grade II/VI SEM noted Left upper sternal border.  RRR Pulm:  Clear to auscultation bilaterally with good air movement.  No wheezes or rales noted.   Abd:  Soft/nontender, good BS Ext:  No clubbing/cyanosis/erythema.  No edema noted bilateral lower extremities.   Psych:  Tearful, depressed appearing        Assessment & Plan:

## 2011-08-07 ENCOUNTER — Encounter: Payer: Self-pay | Admitting: Family Medicine

## 2011-08-07 LAB — CBC
HCT: 37.1 % (ref 36.0–46.0)
Hemoglobin: 12.6 g/dL (ref 12.0–15.0)
MCH: 27.7 pg (ref 26.0–34.0)
MCV: 81.5 fL (ref 78.0–100.0)
RBC: 4.55 MIL/uL (ref 3.87–5.11)

## 2011-08-07 LAB — COMPREHENSIVE METABOLIC PANEL
Alkaline Phosphatase: 56 U/L (ref 39–117)
BUN: 9 mg/dL (ref 6–23)
Creat: 0.8 mg/dL (ref 0.50–1.10)
Glucose, Bld: 110 mg/dL — ABNORMAL HIGH (ref 70–99)
Sodium: 140 mEq/L (ref 135–145)
Total Bilirubin: 0.5 mg/dL (ref 0.3–1.2)
Total Protein: 7.4 g/dL (ref 6.0–8.3)

## 2011-08-08 DIAGNOSIS — F329 Major depressive disorder, single episode, unspecified: Secondary | ICD-10-CM | POA: Insufficient documentation

## 2011-08-08 DIAGNOSIS — F32A Depression, unspecified: Secondary | ICD-10-CM | POA: Insufficient documentation

## 2011-08-08 NOTE — Assessment & Plan Note (Signed)
PHQ-9 score of 8 in clinic today, placing her in mild depression scale. I do note history of bipolar disorder in Problem list, though patient denies knowledge of this.  Also denies any manic or hypomanic history today with me.   Although scale places her in mild range, based on affect and history she tells me, I would move her to moderate range of depression.   Starting Bupropion today. FU in 2 weeks for assessment. Discussed when she should start noticing improvement.

## 2011-08-13 ENCOUNTER — Telehealth: Payer: Self-pay | Admitting: Family Medicine

## 2011-08-13 NOTE — Telephone Encounter (Signed)
Call [name] back regarding the side effects she's experiencing from the new med prescribed

## 2011-08-13 NOTE — Telephone Encounter (Signed)
Attempted to call back at number provided -- however female voice answered stating it's wrong number.  If she calls back can we get some more information about the side effects?

## 2011-08-14 ENCOUNTER — Telehealth: Payer: Self-pay | Admitting: Family Medicine

## 2011-08-14 NOTE — Telephone Encounter (Signed)
Patient had concern because of rash and lower back pain.  Has history of lower back pain.  Described rash as itching, bumps on arms and back.  Resolved after 1 day.  She has been putting an OTC moisturizer on the area as well.  She states she has already felt some positive effects from the Welbutrin and doesn't want to stop.    Discussed that if rash returns, she notices any rash around her mouth, any nausea or vomiting she should stop the medicine immediately and call us back.   She also stated she's having a little trouble falling asleep.  Recommended she take Buproprion earlier in afternoon to help with this.  She expressed understanding and appreciation.

## 2011-08-14 NOTE — Telephone Encounter (Signed)
Pt says she has broken out with Hives type rash,ears popping and aching and back pain.  Please call patient back.

## 2011-08-21 ENCOUNTER — Telehealth: Payer: Self-pay | Admitting: Family Medicine

## 2011-08-21 NOTE — Telephone Encounter (Signed)
Returned call to patient.  Patient went walking and her shoe was rubbing against her toe.  Now she has a "blood blister" on the side of her toe.  Wants to know what she needs to do.  Advice below given from "Up-to-date" on blister care: Wash the area with soap and water.  Do not pop or poke the blister with a sharp object. Opening the blister makes it more likely to get infected and slows healing.  If the blister pops, keep the area clean and cover with a bandage to protect it.  Patient verbalized understanding.  Gaylene Brooks, RN

## 2011-08-21 NOTE — Telephone Encounter (Signed)
Cathy Bond has a Blood Blister that she is concerned about and wants to know what she needs to watch for and how to treat.

## 2011-08-23 ENCOUNTER — Ambulatory Visit (INDEPENDENT_AMBULATORY_CARE_PROVIDER_SITE_OTHER): Payer: Medicare Other | Admitting: Family Medicine

## 2011-08-23 ENCOUNTER — Encounter: Payer: Self-pay | Admitting: Family Medicine

## 2011-08-23 DIAGNOSIS — M545 Low back pain, unspecified: Secondary | ICD-10-CM

## 2011-08-23 DIAGNOSIS — R0602 Shortness of breath: Secondary | ICD-10-CM | POA: Diagnosis not present

## 2011-08-23 DIAGNOSIS — F3289 Other specified depressive episodes: Secondary | ICD-10-CM | POA: Diagnosis not present

## 2011-08-23 DIAGNOSIS — T148XXA Other injury of unspecified body region, initial encounter: Secondary | ICD-10-CM | POA: Insufficient documentation

## 2011-08-23 DIAGNOSIS — F329 Major depressive disorder, single episode, unspecified: Secondary | ICD-10-CM

## 2011-08-23 DIAGNOSIS — M549 Dorsalgia, unspecified: Secondary | ICD-10-CM | POA: Insufficient documentation

## 2011-08-23 DIAGNOSIS — F32A Depression, unspecified: Secondary | ICD-10-CM

## 2011-08-23 NOTE — Progress Notes (Signed)
  Subjective:    Patient ID: Cathy Bond, female    DOB: 1970-11-12, 41 y.o.   MRN: [id number]  HPI 1.  depression: Patient continued to take bupropion for about 12 days and then stopped this past Sunday which was 5 days ago. She began to experience headache and left eye pain and was concerned this was secondary to medication. However she states that her depression is very much improved. She is smiling and interactive and going on Pulmicort. She is extremely appreciative for being started on the medication. She is walking again for exercise and is looking into finishing her GED degree. She states that she will probably stay off the medication unless she starts having trouble again in the future. No anxiety currently.  Denies any manic symptoms including staying up late at night, extra shopping, feeling discoloration.  #2 low back pain: Present for the past 6-7 months. Describes aching pain at night when she tries to go to sleep and lower lumbar region. This does not occur every day. It resolves with Tylenol or Aleve usage.  No incontinence. Only exercise is going for walks.  Pain is nonradiating. She denies any paresthesias bilateral lower extremities.  #3. Shortness of breath: The patient describes occasional shortness of breath at rest. Denies any dyspnea on exertion. States that sometimes when she is at home she has trouble catching a full breath. Denies any chest pain when this occurs. She is a former 2 pack a day smoker and stopped about 3 months ago. Occasionally has nonproductive cough. Also complains of occasional nasal drainage. She states she is not taking her Claritin and Nasonex currently.  Shortness of breath does not occur when she is out in public and does not seem to be related to crowds.  #4. Blister on right great toe. Since patient has started walking more she is wearing her daughter's issues which are about 1/2 large. She developed a blood blister on her right great toe. She called  and has been doing the recommendations provided her by Clinic Manager here. No further pain. Blisters not draining as started to decrease in size.  The following portions of the patient's history were reviewed and updated as appropriate: allergies, current medications, past medical history, family and social history, and problem list, including fact she is current smoker.    Review of Systems See HPI above for review of systems.       Objective:   Physical Exam  Gen:  Alert, cooperative patient who appears stated age in no acute distress.  Vital signs reviewed.  Patient smiling, sitting forward in chair, very active. Conversant. HEENT:  [place]/AT.  EOMI, PERRL.  nasal turbinates mildly enlarged bilaterally. No exudates noted. MMM, tonsils non-erythematous, non-edematous.  External ears WNL, Bilateral TM's normal without retraction, redness or bulging.  Neck:  No LAD or thyromegaly Cardiac:  Regular rate and rhythm without murmur auscultated.  Good S1/S2. Pulm:  Clear to auscultation bilaterally with good air movement.  No wheezes or rales noted.   Abd:  Soft/nondistended/nontender.  Good bowel sounds throughout all four quadrants.  No masses noted.  MSK:  Nontender over the spinous processes. Very minimal tenderness bilateral paraspinal muscles and lumbar region. No tenderness over SI joints. Ext:  No clubbing/cyanosis/erythema.  No edema noted bilateral lower extremities. 4 mm blood blister noted medial aspect of Right great toe.  Psych:  Not depressed or anxious appearing      Assessment & Plan:

## 2011-08-23 NOTE — Assessment & Plan Note (Addendum)
Much improved. I wonder if this is possible placebo effect from the medication. Other concern is that I see the bipolar diagnosis below, there is a possibility that the antidepressant that turned into a manic state. She does deny any of these symptoms currently. She also denies any knowledge of bipolar disorder. Want her to come back in 3 -4 works to make sure the truly has not flipped to manic state. Recommended she call and discuss either restarting the medication or before stopping any of her medications in the future. Provided information to [facility] as patient interested in Counseling/therapy.

## 2011-08-23 NOTE — Assessment & Plan Note (Signed)
Very minimal symptoms. Occurs at rest and never related to exertion. She is a former smoker and is still recovering. She is to continue to monitor the symptoms and call back or return if she has any worsening. Physical exam clear and does not seem to be related to or a sign of any cardiac disease.

## 2011-08-23 NOTE — Patient Instructions (Signed)
The name of the Counseling Center is called [facility].  The number is [phone].  You can also try [phone].  Ask for counseling or therapy. Keep working hard at increasing your activity and the back exercises. I'm so glad you're feeling much better.

## 2011-08-23 NOTE — Assessment & Plan Note (Signed)
Musculoskeletal pain. Recommended back exercises and to continue increasing her exercise. She is to continue over-the-counter analgesics for relief. Give her redflags and warnings that would prompt call back.

## 2011-08-23 NOTE — Assessment & Plan Note (Signed)
Resolving. Recommended keeping the area covered to prevent bursting.

## 2011-09-10 ENCOUNTER — Encounter (HOSPITAL_COMMUNITY): Payer: Self-pay | Admitting: *Deleted

## 2011-09-10 ENCOUNTER — Emergency Department (HOSPITAL_COMMUNITY)
Admission: EM | Admit: 2011-09-10 | Discharge: 2011-09-11 | Disposition: A | Discharge status: Home / Self Care | Payer: Medicare Other | Attending: Emergency Medicine | Admitting: Emergency Medicine

## 2011-09-10 DIAGNOSIS — G43909 Migraine, unspecified, not intractable, without status migrainosus: Secondary | ICD-10-CM | POA: Diagnosis not present

## 2011-09-10 DIAGNOSIS — R51 Headache: Secondary | ICD-10-CM | POA: Diagnosis not present

## 2011-09-10 MED ORDER — DEXAMETHASONE SODIUM PHOSPHATE 10 MG/ML IJ SOLN
10.0000 mg | Freq: Once | INTRAMUSCULAR | Status: AC
  Administered 2011-09-10: 10 mg via INTRAVENOUS
  Filled 2011-09-10: qty 1

## 2011-09-10 MED ORDER — OXYCODONE-ACETAMINOPHEN 5-325 MG PO TABS
1.0000 | ORAL_TABLET | Freq: Once | ORAL | Status: AC
  Administered 2011-09-10: 1 via ORAL
  Filled 2011-09-10: qty 1

## 2011-09-10 NOTE — Discharge Instructions (Signed)
YOU CAN TAKE TYLENOL OR MOTRIN FOR YOUR HEADACHE. Please review the signs and symptoms below that warrant a return to the ED.    Headaches, Frequently Asked Questions MIGRAINE HEADACHES Q: What is migraine? What causes it? How can I treat it? A: Generally, migraine headaches begin as a dull ache. Then they develop into a constant, throbbing, and pulsating pain. You may experience pain at the temples. You may experience pain at the front or back of one or both sides of the head. The pain is usually accompanied by a combination of:  Nausea.   Vomiting.   Sensitivity to light and noise.  Some people (about 15%) experience an aura (see below) before an attack. The cause of migraine is believed to be chemical reactions in the brain. Treatment for migraine may include over-the-counter or prescription medications. It may also include self-help techniques. These include relaxation training and biofeedback.  Q: What is an aura? A: About 15% of people with migraine get an "aura". This is a sign of neurological symptoms that occur before a migraine headache. You may see wavy or jagged lines, dots, or flashing lights. You might experience tunnel vision or blind spots in one or both eyes. The aura can include visual or auditory hallucinations (something imagined). It may include disruptions in smell (such as strange odors), taste or touch. Other symptoms include:  Numbness.   A "pins and needles" sensation.   Difficulty in recalling or speaking the correct word.  These neurological events may last as long as 60 minutes. These symptoms will fade as the headache begins. Q: What is a trigger? A: Certain physical or environmental factors can lead to or "trigger" a migraine. These include:  Foods.   Hormonal changes.   Weather.   Stress.  It is important to remember that triggers are different for everyone. To help prevent migraine attacks, you need to figure out which triggers affect you. Keep a  headache diary. This is a good way to track triggers. The diary will help you talk to your healthcare professional about your condition. Q: Does weather affect migraines? A: Bright sunshine, hot, humid conditions, and drastic changes in barometric pressure may lead to, or "trigger," a migraine attack in some people. But studies have shown that weather does not act as a trigger for everyone with migraines. Q: What is the link between migraine and hormones? A: Hormones start and regulate many of your body's functions. Hormones keep your body in balance within a constantly changing environment. The levels of hormones in your body are unbalanced at times. Examples are during menstruation, pregnancy, or menopause. That can lead to a migraine attack. In fact, about three quarters of all women with migraine report that their attacks are related to the menstrual cycle.  Q: Is there an increased risk of stroke for migraine sufferers? A: The likelihood of a migraine attack causing a stroke is very remote. That is not to say that migraine sufferers cannot have a stroke associated with their migraines. In persons under age 69, the most common associated factor for stroke is migraine headache. But over the course of a person's normal life span, the occurrence of migraine headache may actually be associated with a reduced risk of dying from cerebrovascular disease due to stroke.  Q: What are acute medications for migraine? A: Acute medications are used to treat the pain of the headache after it has started. Examples over-the-counter medications, NSAIDs, ergots, and triptans.  Q: What are the triptans? A:  Triptans are the newest class of abortive medications. They are specifically targeted to treat migraine. Triptans are vasoconstrictors. They moderate some chemical reactions in the brain. The triptans work on receptors in your brain. Triptans help to restore the balance of a neurotransmitter called serotonin.  Fluctuations in levels of serotonin are thought to be a main cause of migraine.  Q: Are over-the-counter medications for migraine effective? A: Over-the-counter, or "OTC," medications may be effective in relieving mild to moderate pain and associated symptoms of migraine. But you should see your caregiver before beginning any treatment regimen for migraine.  Q: What are preventive medications for migraine? A: Preventive medications for migraine are sometimes referred to as "prophylactic" treatments. They are used to reduce the frequency, severity, and length of migraine attacks. Examples of preventive medications include antiepileptic medications, antidepressants, beta-blockers, calcium channel blockers, and NSAIDs (nonsteroidal anti-inflammatory drugs). Q: Why are anticonvulsants used to treat migraine? A: During the past few years, there has been an increased interest in antiepileptic drugs for the prevention of migraine. They are sometimes referred to as "anticonvulsants". Both epilepsy and migraine may be caused by similar reactions in the brain.  Q: Why are antidepressants used to treat migraine? A: Antidepressants are typically used to treat people with depression. They may reduce migraine frequency by regulating chemical levels, such as serotonin, in the brain.  Q: What alternative therapies are used to treat migraine? A: The term "alternative therapies" is often used to describe treatments considered outside the scope of conventional Western medicine. Examples of alternative therapy include acupuncture, acupressure, and yoga. Another common alternative treatment is herbal therapy. Some herbs are believed to relieve headache pain. Always discuss alternative therapies with your caregiver before proceeding. Some herbal products contain arsenic and other toxins. TENSION HEADACHES Q: What is a tension-type headache? What causes it? How can I treat it? A: Tension-type headaches occur randomly. They  are often the result of temporary stress, anxiety, fatigue, or anger. Symptoms include soreness in your temples, a tightening band-like sensation around your head (a "vice-like" ache). Symptoms can also include a pulling feeling, pressure sensations, and contracting head and neck muscles. The headache begins in your forehead, temples, or the back of your head and neck. Treatment for tension-type headache may include over-the-counter or prescription medications. Treatment may also include self-help techniques such as relaxation training and biofeedback. CLUSTER HEADACHES Q: What is a cluster headache? What causes it? How can I treat it? A: Cluster headache gets its name because the attacks come in groups. The pain arrives with little, if any, warning. It is usually on one side of the head. A tearing or bloodshot eye and a runny nose on the same side of the headache may also accompany the pain. Cluster headaches are believed to be caused by chemical reactions in the brain. They have been described as the most severe and intense of any headache type. Treatment for cluster headache includes prescription medication and oxygen. SINUS HEADACHES Q: What is a sinus headache? What causes it? How can I treat it? A: When a cavity in the bones of the face and skull (a sinus) becomes inflamed, the inflammation will cause localized pain. This condition is usually the result of an allergic reaction, a tumor, or an infection. If your headache is caused by a sinus blockage, such as an infection, you will probably have a fever. An x-ray will confirm a sinus blockage. Your caregiver's treatment might include antibiotics for the infection, as well as antihistamines or decongestants.  REBOUND HEADACHES Q: What is a rebound headache? What causes it? How can I treat it? A: A pattern of taking acute headache medications too often can lead to a condition known as "rebound headache." A pattern of taking too much headache medication  includes taking it more than 2 days per week or in excessive amounts. That means more than the label or a caregiver advises. With rebound headaches, your medications not only stop relieving pain, they actually begin to cause headaches. Doctors treat rebound headache by tapering the medication that is being overused. Sometimes your caregiver will gradually substitute a different type of treatment or medication. Stopping may be a challenge. Regularly overusing a medication increases the potential for serious side effects. Consult a caregiver if you regularly use headache medications more than 2 days per week or more than the label advises. ADDITIONAL QUESTIONS AND ANSWERS Q: What is biofeedback? A: Biofeedback is a self-help treatment. Biofeedback uses special equipment to monitor your body's involuntary physical responses. Biofeedback monitors:  Breathing.   Pulse.   Heart rate.   Temperature.   Muscle tension.   Brain activity.  Biofeedback helps you refine and perfect your relaxation exercises. You learn to control the physical responses that are related to stress. Once the technique has been mastered, you do not need the equipment any more. Q: Are headaches hereditary? A: Four out of five (80%) of people that suffer report a family history of migraine. Scientists are not sure if this is genetic or a family predisposition. Despite the uncertainty, a child has a 50% chance of having migraine if one parent suffers. The child has a 75% chance if both parents suffer.  Q: Can children get headaches? A: By the time they reach high school, most young people have experienced some type of headache. Many safe and effective approaches or medications can prevent a headache from occurring or stop it after it has begun.  Q: What type of doctor should I see to diagnose and treat my headache? A: Start with your primary caregiver. Discuss his or her experience and approach to headaches. Discuss methods of  classification, diagnosis, and treatment. Your caregiver may decide to recommend you to a headache specialist, depending upon your symptoms or other physical conditions. Having diabetes, allergies, etc., may require a more comprehensive and inclusive approach to your headache. The National Head[employer] Foundation will provide, upon request, a list of [facility] physician members in your state. Document Released: 09/28/2003 Document Revised: 03/20/2011 Document Reviewed: 03/07/2008 [facility] Patient Information 2012 [place], [place].Migraine Headache A migraine headache is an intense, throbbing pain on one or both sides of your head. The exact cause of a migraine headache is not always known. A migraine may be caused when nerves in the brain become irritated and release chemicals that cause swelling within blood vessels, causing pain. Many migraine sufferers have a family history of migraines. Before you get a migraine you may or may not get an aura. An aura is a group of symptoms that can predict the beginning of a migraine. An aura may include:  Visual changes such as:   Flashing lights.   Bright spots or zig-zag lines.   Tunnel vision.   Feelings of numbness.   Trouble talking.   Muscle weakness.  SYMPTOMS  Pain on one or both sides of your head.   Pain that is pulsating or throbbing in nature.   Pain that is severe enough to prevent daily activities.   Pain that is aggravated by any daily  physical activity.   Nausea (feeling sick to your stomach), vomiting, or both.   Pain with exposure to bright lights, loud noises, or activity.   General sensitivity to bright lights or loud noises.  MIGRAINE TRIGGERS Examples of triggers of migraine headaches include:   Alcohol.   Smoking.   Stress.   It may be related to menses (female menstruation).   Aged cheeses.   Foods or drinks that contain nitrates, glutamate, aspartame, or tyramine.   Lack of sleep.   Chocolate.   Caffeine.    Hunger.   Medications such as nitroglycerine (used to treat chest pain), birth control pills, estrogen, and some blood pressure medications.  DIAGNOSIS  A migraine headache is often diagnosed based on:  Symptoms.   Physical examination.   A computerized X-ray scan (computed tomography, CT) of your head.  TREATMENT  Medications can help prevent migraines if they are recurrent or should they become recurrent. Your caregiver can help you with a medication or treatment program that will be helpful to you.   Lying down in a dark, quiet room may be helpful.   Keeping a headache diary may help you find a trend as to what may be triggering your headaches.  SEEK IMMEDIATE MEDICAL CARE IF:   You have confusion, personality changes or seizures.   You have headaches that wake you from sleep.   You have an increased frequency in your headaches.   You have a stiff neck.   You have a loss of vision.   You have muscle weakness.   You start losing your balance or have trouble walking.   You feel faint or pass out.  MAKE SURE YOU:   Understand these instructions.   Will watch your condition.   Will get help right away if you are not doing well or get worse.  Document Released: 07/08/2005 Document Revised: 03/20/2011 Document Reviewed: 02/21/2009 [facility] Patient Information 2012 [place], [place].

## 2011-09-10 NOTE — ED Provider Notes (Signed)
History     CSN: [id number]  Arrival date & time 09/10/11  4098   First MD Initiated Contact with Patient 09/10/11 2236      Chief Complaint  Patient presents with  . Headache    (Consider location/radiation/quality/duration/timing/severity/associated sxs/prior treatment) HPI  This patient presents to the ED with complaints of a headache. She has gotten headaches like this in the past and suffers from allergies in which she takes Claritin everyday. She states that her eyes are sensitive to light but denies phonophobia. She denies N/V/D, fevers, weakness,  She denies urinary or bowel incontinence. The patient is able to ambulate and is not showing generalized or focal weakness, negative for headaches. The pain goes from her right pariatal lobe upwards toward her frontal lobe and is tingling. The patient denies having symptoms in her arms or legs.  She does describe right ear pain  Past Medical History  Diagnosis Date  . Seasonal allergies   . Depression   . Anxiety     Past Surgical History  Procedure Date  . Tubal ligation   . Toe nail     Family History  Problem Relation Age of Onset  . Diabetes Mother   . Stroke Mother   . Depression Sister   . Alcohol abuse Sister   . Diabetes Sister   . Depression Brother   . Diabetes Brother     History  Substance Use Topics  . Smoking status: Former Smoker -- 2.0 packs/day for 6 years    Types: Cigarettes  . Smokeless tobacco: Not on file   Comment: used patches before  . Alcohol Use: 1.2 oz/week    2 Cans of beer per week    OB History    Grav Para Term Preterm Abortions TAB SAB Ect Mult Living                  Review of Systems  All other systems reviewed and are negative.    Allergies  Wellbutrin  Home Medications   Current Outpatient Rx  Name Route Sig Dispense Refill  . LORATADINE 10 MG PO TABS Oral Take 10 mg by mouth daily.    . MOMETASONE FUROATE 50 MCG/ACT NA SUSP Nasal Place 2 sprays into the  nose daily as needed. FOR CONGESTION    . ADULT MULTIVITAMIN W/MINERALS CH Oral Take 1 tablet by mouth daily.      BP 131/73  Pulse 86  Temp(Src) 98.6 F (37 C) (Oral)  Resp 20  SpO2 100%  LMP 08/27/2011  Physical Exam  Constitutional: She is oriented to person, place, and time. She appears well-developed and well-nourished.  HENT:  Head: Normocephalic and atraumatic.  Eyes: Pupils are equal, round, and reactive to light.  Neck: Trachea normal, normal range of motion and full passive range of motion without pain. Neck supple.  Cardiovascular: Normal rate, regular rhythm and normal pulses.   Pulmonary/Chest: Effort normal. Chest wall is not dull to percussion. She exhibits no tenderness, no crepitus, no edema, no deformity and no retraction.  Abdominal: Soft. Normal appearance.  Musculoskeletal: Normal range of motion.  Neurological: She is alert and oriented to person, place, and time. She has normal strength. No cranial nerve deficit or sensory deficit. GCS eye subscore is 4. GCS verbal subscore is 5. GCS motor subscore is 6.  Skin: Skin is warm, dry and intact.  Psychiatric: Her speech is normal. Cognition and memory are normal.    ED Course  Procedures (including critical care  time)  Labs Reviewed - No data to display No results found.   1. Migraine       MDM  Pt given a shot of Dexamethasone and Percocet. After 30 minutes after the medication she said her headache went down to a 1/10 and that she was feeling much better. Patient has been informed that she can take Tylenol or Motrin at home for the pain. Patient has been given return to ED precautions and voices her understanding        Dorthula Matas, [place] 09/11/11 [phone]

## 2011-09-10 NOTE — ED Notes (Signed)
Pt also stating left sided facial numbness onset 09/09/11 ~ 1000hrs. No facial droop noted; equal grips bilat; neg for arm drift; no speech abnormalities noted.

## 2011-09-10 NOTE — ED Notes (Signed)
C/o HA (R posterior head) , L facial tingling, also tingling in bilateral legs, mentions tingling in L hand yesterday. Also cold sx which she r/t allergies, "have been blowing nose" (nasal congestion, but no chest congestion, also "popping in L ear"). (denies: vd or fever). Alert, NAD, calm, interactive, skin W&D, resps e/u, speaking in clear complete sentences. Now mentions back pains (low to mid back pain for months).

## 2011-09-11 NOTE — ED Provider Notes (Signed)
Medical screening examination/treatment/procedure(s) were performed by non-physician practitioner and as supervising physician I was immediately available for consultation/collaboration.  Flint Melter, MD 09/11/11 [phone]

## 2011-09-23 ENCOUNTER — Encounter: Payer: Self-pay | Admitting: Family Medicine

## 2011-09-23 ENCOUNTER — Ambulatory Visit (INDEPENDENT_AMBULATORY_CARE_PROVIDER_SITE_OTHER): Payer: Medicare Other | Admitting: Family Medicine

## 2011-09-23 DIAGNOSIS — R200 Anesthesia of skin: Secondary | ICD-10-CM | POA: Insufficient documentation

## 2011-09-23 DIAGNOSIS — R5381 Other malaise: Secondary | ICD-10-CM

## 2011-09-23 DIAGNOSIS — R209 Unspecified disturbances of skin sensation: Secondary | ICD-10-CM

## 2011-09-23 DIAGNOSIS — R5383 Other fatigue: Secondary | ICD-10-CM

## 2011-09-23 NOTE — Assessment & Plan Note (Signed)
Discussed importance of mantaining enough protein in diet as she attempts weight loss.

## 2011-09-23 NOTE — Assessment & Plan Note (Signed)
No red flags or other concerning features.  Will proceed with watchful waiting and see back in ~1 month

## 2011-09-23 NOTE — Patient Instructions (Signed)
It was good to see you today! I would like you to come back to see me in about 1 month.  I don't think that the numbness in your face is anything to be worried about but I want to keep a close eye on it for a few months and make certain nothing develops. Keep up the good work on weight loss.  Try to get 8-9 hours of sleep every night and make certain you are getting some protein in your diet.

## 2011-09-23 NOTE — Progress Notes (Signed)
Subjective: The patient is a 41 y.o. year old female who presents today for numbness on left side of face.  Numbness: Pt reports that she has been having problems with numb feeling on the left side of her face and some tingling in her left hand.  Has been having more headaches than normal recently.  Has been having some problems with falling asleep.  No problems with speech.  No changes in vision.  Slight subjective weakness in left hand.  Rash:  Patient taking Claritin, still breaking out, mainly in the mornings.  Has changed bath soap but not laundry detergent.  Has changed linens.  Stopped smoking 6 months ago.  Objective:  Filed Vitals:   09/23/11 0959  BP: 126/86  Pulse: 88   Gen: NAD, overweight HEENT: MMM, EOMI, PERRL, CN ii-xii intact.  Sensation somewhat decreased on left, especially along mandible.  No facial asymmetry or other concerning findings. CV: RRR Resp: CTABL Ext: No edema, 5+ strength bilaterally  Assessment/Plan:  Please also see individual problems in problem list for problem-specific plans.

## 2011-10-17 ENCOUNTER — Telehealth: Payer: Self-pay | Admitting: Family Medicine

## 2011-10-17 NOTE — Telephone Encounter (Signed)
Returned call to patient.  Has been taking Claritin for allergies 2 1/2 months.  Started developing hives on face and head.  Has also been having some itching.  Denies any new soaps, foods, or meds.  Rash started on 10/15/11 and has worsened.  Took Claritin at 11 am and feels hives are getting better.  No office appts available for today and informed patient she may go to urgent care to be evaluated.  Patient has Rx for Benadryl that she received from urgent care in November 2012 and wants to know if she can take this with Claritin.  Spoke with Dr. Deirdre Priest and ok to take Benadryl with Claritin.  Informed patient that Benadryl may cause drowsiness and dry mouth.  Patient will try Benadryl and go to urgent care if not better.  Gaylene Brooks, RN

## 2011-10-17 NOTE — Telephone Encounter (Signed)
Has been breaking out in hives and has been taking claritin.  Not sure if that's what is causing it.

## 2011-10-18 ENCOUNTER — Emergency Department (INDEPENDENT_AMBULATORY_CARE_PROVIDER_SITE_OTHER)
Admission: EM | Admit: 2011-10-18 | Discharge: 2011-10-18 | Disposition: A | Discharge status: Home / Self Care | Payer: Medicare Other | Source: Home / Self Care | Attending: Family Medicine | Admitting: Family Medicine

## 2011-10-18 ENCOUNTER — Encounter (HOSPITAL_COMMUNITY): Payer: Self-pay | Admitting: *Deleted

## 2011-10-18 DIAGNOSIS — L501 Idiopathic urticaria: Secondary | ICD-10-CM | POA: Diagnosis not present

## 2011-10-18 MED ORDER — METHYLPREDNISOLONE ACETATE 80 MG/ML IJ SUSP
INTRAMUSCULAR | Status: AC
  Filled 2011-10-18: qty 1

## 2011-10-18 MED ORDER — TRIAMCINOLONE ACETONIDE 40 MG/ML IJ SUSP
INTRAMUSCULAR | Status: AC
  Filled 2011-10-18: qty 5

## 2011-10-18 MED ORDER — TRIAMCINOLONE ACETONIDE 40 MG/ML IJ SUSP
40.0000 mg | Freq: Once | INTRAMUSCULAR | Status: AC
  Administered 2011-10-18: 40 mg via INTRAMUSCULAR

## 2011-10-18 MED ORDER — FAMOTIDINE 20 MG PO TABS: 20.0000 mg | ORAL_TABLET | Freq: Two times a day (BID) | ORAL | Status: DC

## 2011-10-18 MED ORDER — METHYLPREDNISOLONE ACETATE 40 MG/ML IJ SUSP
80.0000 mg | Freq: Once | INTRAMUSCULAR | Status: AC
  Administered 2011-10-18: 80 mg via INTRAMUSCULAR

## 2011-10-18 MED ORDER — HYDROXYZINE HCL 50 MG PO TABS: 50.0000 mg | ORAL_TABLET | Freq: Four times a day (QID) | ORAL | Status: AC

## 2011-10-18 NOTE — ED Provider Notes (Signed)
History     CSN: [id number]  Arrival date & time 10/18/11  0848   First MD Initiated Contact with Patient 10/18/11 [phone]      Chief Complaint  Patient presents with  . Rash    (Consider location/radiation/quality/duration/timing/severity/associated sxs/prior treatment) Patient is a 41 y.o. female presenting with rash. The history is provided by the patient.  Rash  This is a new problem. The current episode started more than 2 days ago. The problem has not changed since onset.The problem is associated with an unknown factor. There has been no fever. The rash is present on the torso, back, abdomen, left arm and right arm. Associated symptoms include itching. Pertinent negatives include no blisters and no pain. She has tried antihistamines for the symptoms. The treatment provided no relief.    Past Medical History  Diagnosis Date  . Seasonal allergies   . Depression   . Anxiety     Past Surgical History  Procedure Date  . Tubal ligation   . Toe nail     Family History  Problem Relation Age of Onset  . Diabetes Mother   . Stroke Mother   . Depression Sister   . Alcohol abuse Sister   . Diabetes Sister   . Depression Brother   . Diabetes Brother     History  Substance Use Topics  . Smoking status: Former Smoker -- 2.0 packs/day for 6 years    Types: Cigarettes  . Smokeless tobacco: Not on file   Comment: used patches before  . Alcohol Use: 1.2 oz/week    2 Cans of beer per week    OB History    Grav Para Term Preterm Abortions TAB SAB Ect Mult Living                  Review of Systems  Constitutional: Negative.   Skin: Positive for itching and rash.    Allergies  Wellbutrin  Home Medications   Current Outpatient Rx  Name Route Sig Dispense Refill  . DIPHENHYDRAMINE HCL (SLEEP) 25 MG PO TABS Oral Take 25 mg by mouth as needed.    Marland Kitchen LORATADINE 10 MG PO TABS Oral Take 10 mg by mouth daily.    . ADULT MULTIVITAMIN W/MINERALS CH Oral Take 1 tablet by  mouth daily.    Marland Kitchen FAMOTIDINE 20 MG PO TABS Oral Take 1 tablet (20 mg total) by mouth 2 (two) times daily. 30 tablet 0  . HYDROXYZINE HCL 50 MG PO TABS Oral Take 1 tablet (50 mg total) by mouth every 6 (six) hours. 20 tablet 0  . MOMETASONE FUROATE 50 MCG/ACT NA SUSP Nasal Place 2 sprays into the nose daily as needed. FOR CONGESTION      BP 132/90  Pulse 78  Temp(Src) 97.9 F (36.6 C) (Oral)  Resp 16  SpO2 99%  LMP 10/11/2011  Physical Exam  Nursing note and vitals reviewed. Constitutional: She is oriented to person, place, and time. She appears well-developed and well-nourished.  HENT:  Head: Normocephalic.  Right Ear: External ear normal.  Left Ear: External ear normal.  Nose: Nose normal.  Mouth/Throat: Oropharynx is clear and moist.  Eyes: Pupils are equal, round, and reactive to light.  Neck: Normal range of motion. Neck supple.  Cardiovascular: Normal rate, regular rhythm and normal heart sounds.   Pulmonary/Chest: Breath sounds normal.  Lymphadenopathy:    She has no cervical adenopathy.  Neurological: She is alert and oriented to person, place, and time.  Skin: Skin  is warm and dry. Rash noted.       Generalized urticarial rash    ED Course  Procedures (including critical care time)  Labs Reviewed - No data to display No results found.   1. Urticaria, idiopathic       MDM          Linna Hoff, MD 10/21/11 [phone]

## 2011-10-18 NOTE — ED Notes (Signed)
Pt c/o itching, rash, hives onset 3 days ago.  Has been taking Claritin and Benadryl without relief.  Red, raised welts noted all over body.

## 2011-11-19 ENCOUNTER — Encounter: Payer: Self-pay | Admitting: Family Medicine

## 2011-11-19 ENCOUNTER — Ambulatory Visit (INDEPENDENT_AMBULATORY_CARE_PROVIDER_SITE_OTHER): Payer: Medicare Other | Admitting: Family Medicine

## 2011-11-19 VITALS — BP 121/84 | HR 75 | Temp 97.9°F | Ht 64.0 in | Wt 179.0 lb

## 2011-11-19 DIAGNOSIS — R200 Anesthesia of skin: Secondary | ICD-10-CM

## 2011-11-19 DIAGNOSIS — R209 Unspecified disturbances of skin sensation: Secondary | ICD-10-CM | POA: Diagnosis not present

## 2011-11-19 DIAGNOSIS — K219 Gastro-esophageal reflux disease without esophagitis: Secondary | ICD-10-CM | POA: Diagnosis not present

## 2011-11-19 MED ORDER — OMEPRAZOLE 40 MG PO CPDR: 40.0000 mg | DELAYED_RELEASE_CAPSULE | Freq: Every day | ORAL | Status: DC

## 2011-11-19 NOTE — Progress Notes (Signed)
Subjective:     Patient ID: Cathy Bond, female   DOB: 1970/08/03, 41 y.o.   MRN: [id number]  HPI  Cathy Bond presents today with complaint of 6-7 months of episodic facial numbness, occurring 1-2 times per week and lasting 1-3 days, most frequently starting in the morning. The numbness encompasses her left face from below her brow line to just above her mandible.  This is non-painful, non-erythematous, and non-swollen.  She describes no motor deficit or paresthesia. She has not found anything providing relief or accelerating the onset of these symptoms. Additionally she described subjective weakness in her left arm that has happened intermittently.  She also related a visit to urgent care three weeks ago in which she was treated for allergy-related hives and GERD, for which she received famotidine causing her headache.  She denies fever, chills, nausea, vomiting, and headache.    The patient reports that she has had problems with reflux some time now. It presents as a burning in her throat. She has tried the famotidine which does not seem to help her symptoms significantly and, as noted above, has been causing her headaches. Review of Systems The patient is symptom free except for those problems outlined in the HPI.    Objective:   Physical Exam  Constitutional: She is oriented to person, place, and time. She appears well-developed and well-nourished.  HENT:  Head: Normocephalic.  Eyes: Pupils are equal, round, and reactive to light.  Cardiovascular: Normal rate and regular rhythm.   Pulmonary/Chest: Effort normal and breath sounds normal. No respiratory distress. She has no wheezes. She has no rales.  Abdominal: Soft. Bowel sounds are normal.  Neurological: She is alert and oriented to person, place, and time. No cranial nerve deficit. She exhibits normal muscle tone.       Reduced sensation over the V1-2 distribution of the left face and over the left arm to the thumb. Otherwise, CN  II-XII intact, strength 5/5 bilaterally in the upper extremities, and no other focal sensory deficit.

## 2011-11-19 NOTE — Assessment & Plan Note (Signed)
The patient does not have any red flags of weight loss or significant chest pain. I will try giving her a prescription for omeprazole. We discussed the fact that this is often times not covered by insurance and she may have to purchase an over-the-counter.

## 2011-11-19 NOTE — Patient Instructions (Signed)
It was good to see you today! I have sent in a prescription for omeprazole, another medicine for acid reflux. I have also sent in a referral to see an ENT doctor to get their thoughts on what might be causing this.

## 2011-11-19 NOTE — Assessment & Plan Note (Signed)
This is a very puzzling finding. Differential would include such things as parotid gland tumor, TIA, atypical migraine, TMJ joint dysfunction, and neuropathy. Has been going on for multiple months now with no worsening of symptoms, is not associated with any visual changes or changes in voice or difficulty swallowing the chances of it being related to a serious etiology are small. Nonetheless, she finds her symptoms bothersome and worry. As I do not have any better idea to send her to be evaluated by ENT prior to launching into an extensive imaging workup.

## 2011-11-21 DIAGNOSIS — R209 Unspecified disturbances of skin sensation: Secondary | ICD-10-CM | POA: Diagnosis not present

## 2011-11-25 ENCOUNTER — Other Ambulatory Visit: Payer: Self-pay | Admitting: Otolaryngology

## 2011-11-25 ENCOUNTER — Ambulatory Visit: Payer: Medicare Other | Admitting: Family Medicine

## 2011-11-25 ENCOUNTER — Telehealth: Payer: Self-pay | Admitting: Family Medicine

## 2011-11-25 DIAGNOSIS — R209 Unspecified disturbances of skin sensation: Secondary | ICD-10-CM

## 2011-11-25 NOTE — Telephone Encounter (Signed)
Appt scheduled

## 2011-11-25 NOTE — Telephone Encounter (Signed)
I think it would be best for her to see ENT before seeing me again to see if they have anything to say about the results of the MRI.  She certainly doesn't need to see me before the MRI.

## 2011-11-25 NOTE — Telephone Encounter (Signed)
Patient called to cancel her appt for this morning.  When trying to reschedule, she didn't know if she needed to be seen before the MRI on Saturday or after.  Once she finds this out, we will reschedule.

## 2011-11-30 ENCOUNTER — Ambulatory Visit
Admission: RE | Admit: 2011-11-30 | Discharge: 2011-11-30 | Disposition: A | Discharge status: Home / Self Care | Payer: Medicare Other | Source: Ambulatory Visit | Attending: Otolaryngology | Admitting: Otolaryngology

## 2011-11-30 DIAGNOSIS — R29810 Facial weakness: Secondary | ICD-10-CM | POA: Diagnosis not present

## 2011-11-30 DIAGNOSIS — R209 Unspecified disturbances of skin sensation: Secondary | ICD-10-CM

## 2011-11-30 MED ORDER — GADOBENATE DIMEGLUMINE 529 MG/ML IV SOLN
15.0000 mL | Freq: Once | INTRAVENOUS | Status: AC | PRN
  Administered 2011-11-30: 15 mL via INTRAVENOUS

## 2011-12-05 ENCOUNTER — Emergency Department (INDEPENDENT_AMBULATORY_CARE_PROVIDER_SITE_OTHER)
Admission: EM | Admit: 2011-12-05 | Discharge: 2011-12-05 | Disposition: A | Discharge status: Home / Self Care | Payer: Medicare Other | Source: Home / Self Care | Attending: Emergency Medicine | Admitting: Emergency Medicine

## 2011-12-05 ENCOUNTER — Encounter (HOSPITAL_COMMUNITY): Payer: Self-pay | Admitting: *Deleted

## 2011-12-05 DIAGNOSIS — E119 Type 2 diabetes mellitus without complications: Secondary | ICD-10-CM

## 2011-12-05 HISTORY — DX: Other specified bacterial agents as the cause of diseases classified elsewhere: B96.89

## 2011-12-05 HISTORY — DX: Trichomoniasis, unspecified: A59.9

## 2011-12-05 HISTORY — DX: Acute candidiasis of vulva and vagina: B37.31

## 2011-12-05 HISTORY — DX: Acute vaginitis: N76.0

## 2011-12-05 HISTORY — DX: Candidiasis of vulva and vagina: B37.3

## 2011-12-05 LAB — POCT URINALYSIS DIP (DEVICE)
Ketones, ur: NEGATIVE mg/dL
Protein, ur: NEGATIVE mg/dL
Specific Gravity, Urine: <=1.005 (ref 1.005–1.030)
pH: 5.5 (ref 5.0–8.0)

## 2011-12-05 LAB — POCT I-STAT, CHEM 8
Calcium, Ion: 1.13 mmol/L (ref 1.12–1.32)
Hemoglobin: 13.6 g/dL (ref 12.0–15.0)
Sodium: 136 mEq/L (ref 135–145)
TCO2: 29 mmol/L (ref 0–100)

## 2011-12-05 LAB — WET PREP, GENITAL: Trich, Wet Prep: NONE SEEN

## 2011-12-05 MED ORDER — IBUPROFEN 800 MG PO TABS: 800.0000 mg | ORAL_TABLET | Freq: Three times a day (TID) | ORAL | Status: AC | PRN

## 2011-12-05 NOTE — ED Provider Notes (Signed)
History     CSN: [id number]  Arrival date & time 12/05/11  1623   First MD Initiated Contact with Patient 12/05/11 1652      Chief Complaint  Patient presents with  . Abdominal Pain    (Consider location/radiation/quality/duration/timing/severity/associated sxs/prior treatment) HPI Comments: Patient with urinary urgency, frequency, dysuria for 2 weeks. Reports polyuria, polydipsia, unintentional 10 pound weight loss over the past month. Some dark in urine, when she started having vaginal bleeding. No oderous, cloudy urine. No nausea, vomiting, fevers. No back pain. Reports some intermittent, non-radiating, midline lower abdominal cramping and some vaginal spotting starting several days ago. States is consistent with menses, but she's not had a regular period since late March. No vaginal discharge, vaginal odor, genital blisters, genital itching. History of bilateral tubal ligation. She is sexually active with her husband, who is asymptomatic. She has a history of trichomoniasis, BV, yeast. No history of gonorrhea, Chlamydia, herpes, syphilis, HIV  Patient is a 41 y.o. female presenting with abdominal pain. The history is provided by the patient. No language interpreter was used.  Abdominal Pain The primary symptoms of the illness include abdominal pain and dysuria. The current episode started more than 2 days ago. The onset of the illness was gradual. The problem has not changed since onset. The dysuria is associated with frequency and urgency. The dysuria is not associated with hematuria.  The patient states that she believes she is currently not pregnant. The patient has not had a change in bowel habit. Additional symptoms associated with the illness include urgency and frequency. Symptoms associated with the illness do not include chills, anorexia, constipation, hematuria or back pain.    Past Medical History  Diagnosis Date  . Seasonal allergies   . Depression   . Anxiety   . BV  (bacterial vaginosis)   . Trichomonas   . Yeast vaginitis     Past Surgical History  Procedure Date  . Tubal ligation   . Toe nail     Family History  Problem Relation Age of Onset  . Diabetes Mother   . Stroke Mother   . Depression Sister   . Alcohol abuse Sister   . Diabetes Sister   . Depression Brother   . Diabetes Brother     History  Substance Use Topics  . Smoking status: Former Smoker -- 2.0 packs/day for 6 years    Types: Cigarettes  . Smokeless tobacco: Not on file   Comment: used patches before  . Alcohol Use: 1.2 oz/week    2 Cans of beer per week    OB History    Grav Para Term Preterm Abortions TAB SAB Ect Mult Living                  Review of Systems  Constitutional: Negative for chills.  Gastrointestinal: Positive for abdominal pain. Negative for constipation and anorexia.  Genitourinary: Positive for dysuria, urgency and frequency. Negative for hematuria.  Musculoskeletal: Negative for back pain.    Allergies  Wellbutrin  Home Medications   Current Outpatient Rx  Name Route Sig Dispense Refill  . IBUPROFEN 800 MG PO TABS Oral Take 1 tablet (800 mg total) by mouth every 8 (eight) hours as needed for pain. 20 tablet 0  . LORATADINE 10 MG PO TABS Oral Take 10 mg by mouth daily.    . MOMETASONE FUROATE 50 MCG/ACT NA SUSP Nasal Place 2 sprays into the nose daily as needed. FOR CONGESTION    . ADULT  MULTIVITAMIN W/MINERALS CH Oral Take 1 tablet by mouth daily.    Marland Kitchen OMEPRAZOLE 40 MG PO CPDR Oral Take 1 capsule (40 mg total) by mouth daily. 30 capsule 3    BP 150/84  Pulse 97  Temp(Src) 98.9 F (37.2 C) (Oral)  Resp 20  SpO2 100%  LMP 10/11/2011  Physical Exam  Nursing note and vitals reviewed. Constitutional: She is oriented to person, place, and time. She appears well-developed and well-nourished. No distress.  HENT:  Head: Normocephalic and atraumatic.  Eyes: EOM are normal.  Neck: Normal range of motion.  Cardiovascular: Normal  rate.   Pulmonary/Chest: Effort normal.  Abdominal: Soft. Bowel sounds are normal. She exhibits no distension. There is no tenderness. There is no rebound and no guarding.  Genitourinary: Pelvic exam was performed with patient supine. There is no rash on the right labia. There is no rash on the left labia. Uterus is tender. Cervix exhibits no motion tenderness and no friability. Right adnexum displays no mass, no tenderness and no fullness. Left adnexum displays no mass, no tenderness and no fullness. There is bleeding around the vagina. No erythema or tenderness around the vagina. No foreign body around the vagina. No vaginal discharge found.       Vaginal bleeding from os consistent menses. os normal.  mildly tender uterus..Chaperone present during exam  Musculoskeletal: Normal range of motion.  Neurological: She is alert and oriented to person, place, and time.  Skin: Skin is warm and dry.  Psychiatric: She has a normal mood and affect. Her behavior is normal. Judgment and thought content normal.    ED Course  Procedures (including critical care time)  Labs Reviewed  POCT URINALYSIS DIP (DEVICE) - Abnormal; Notable for the following:    Glucose, UA 250 (*)    Hgb urine dipstick LARGE (*)    All other components within normal limits  POCT I-STAT, CHEM 8 - Abnormal; Notable for the following:    Glucose, Bld 209 (*)    All other components within normal limits  POCT PREGNANCY, URINE  WET PREP, GENITAL  GC/CHLAMYDIA PROBE AMP, GENITAL   No results found.   1. Diabetes mellitus, new onset     Results for orders placed during the hospital encounter of 12/05/11  POCT URINALYSIS DIP (DEVICE)      Component Value Range   Glucose, UA 250 (*) NEGATIVE (mg/dL)   Bilirubin Urine NEGATIVE  NEGATIVE    Ketones, ur NEGATIVE  NEGATIVE (mg/dL)   Specific Gravity, Urine <=1.005  1.005 - 1.030    Hgb urine dipstick LARGE (*) NEGATIVE    pH 5.5  5.0 - 8.0    Protein, ur NEGATIVE  NEGATIVE  (mg/dL)   Urobilinogen, UA 0.2  0.0 - 1.0 (mg/dL)   Nitrite NEGATIVE  NEGATIVE    Leukocytes, UA NEGATIVE  NEGATIVE   POCT PREGNANCY, URINE      Component Value Range   Preg Test, Ur NEGATIVE  NEGATIVE   POCT I-STAT, CHEM 8      Component Value Range   Sodium 136  135 - 145 (mEq/L)   Potassium 4.0  3.5 - 5.1 (mEq/L)   Chloride 100  96 - 112 (mEq/L)   BUN 12  6 - 23 (mg/dL)   Creatinine, Ser 4.78  0.50 - 1.10 (mg/dL)   Glucose, Bld 295 (*) 70 - 99 (mg/dL)   Calcium, Ion 6.21  3.08 - 1.32 (mmol/L)   TCO2 29  0 - 100 (mmol/L)  Hemoglobin 13.6  12.0 - 15.0 (g/dL)   HCT 40.9  81.1 - 91.4 (%)     MDM  Previous records reviewed. I-STAT January was normal. History of trichomonas, BV., UTI. No history of gonorrhea, Chlamydia, yeast. RPR, HIV  Neg 10/2009.  H&P most c/w new onset diabetes & menses. No anion gap. Udip noted, the patient has vaginal bleeding consistent with menses. No evidence of yeast infection BV trich gonorrhea chlamydia herpes PID UTI. Sent off GC/chlamydia, wet prep,. Will not treat empirically now, as patient has tested negative for STI's multiple times in the past, and her sexual partner has not changed. Advised pt to refrain from sexual contact until she knows lab results, symptoms resolve, and partner(s) are treated. Pt provided working phone number. Pt agrees.     Luiz Blare, MD 12/05/11 [phone]

## 2011-12-05 NOTE — ED Notes (Signed)
Pt  Reports  Low  abd  Cramping    Missed  Period  In  April     Spotting  Now   -  Had  BTL  In past       She  Also  Reports  Frequent urination  And  Dry  Mouth      She  Is  Sitting  Upright on  Exam table  Speaking in complete  sentances  In  No    Severe  Distress

## 2011-12-05 NOTE — Discharge Instructions (Signed)
followup with your physician in several days. You do have new-onset diabetes, but that sometimes this can be managed solely with diet and exercise. Return if you've a fever above 100.4, if you get worse, or any other concerns. As a working phone number so that we can contact you if needed. If you do not hear from [place] in 10 days, then everything is negative. Refrain from sexual activity until you know what your results are.

## 2011-12-06 ENCOUNTER — Telehealth (HOSPITAL_COMMUNITY): Payer: Self-pay | Admitting: *Deleted

## 2011-12-06 LAB — GC/CHLAMYDIA PROBE AMP, GENITAL
Chlamydia, DNA Probe: NEGATIVE
GC Probe Amp, Genital: NEGATIVE

## 2011-12-06 MED ORDER — METRONIDAZOLE 500 MG PO TABS: 500.0000 mg | ORAL_TABLET | Freq: Two times a day (BID) | ORAL | Status: AC

## 2011-12-06 NOTE — ED Notes (Signed)
GC/Chlamydia neg., Wet prep: Many clue cells, few WBC's. Lab shown to Dr. Chaney Malling and she e-prescribed a Rx. of Flagyl to [facility] on [address]. I called pt. and left message to call. Pt. called back a few minutes later and verified x 2. Pt. given results and told she had bacterial vaginosis.  Pt. told her prescription of Flagyl was sent to [facility] and should be ready in about an hour.  Pt. instructed to no alcohol while taking this medication. Pt. voiced understanding.  Vassie Moselle 12/06/2011

## 2011-12-09 ENCOUNTER — Encounter: Payer: Self-pay | Admitting: Family Medicine

## 2011-12-09 ENCOUNTER — Ambulatory Visit (INDEPENDENT_AMBULATORY_CARE_PROVIDER_SITE_OTHER): Payer: Medicare Other | Admitting: Family Medicine

## 2011-12-09 VITALS — BP 125/86 | HR 73 | Temp 98.3°F | Ht 64.0 in | Wt 179.0 lb

## 2011-12-09 DIAGNOSIS — R200 Anesthesia of skin: Secondary | ICD-10-CM

## 2011-12-09 DIAGNOSIS — IMO0001 Reserved for inherently not codable concepts without codable children: Secondary | ICD-10-CM

## 2011-12-09 DIAGNOSIS — IMO0002 Reserved for concepts with insufficient information to code with codable children: Secondary | ICD-10-CM

## 2011-12-09 DIAGNOSIS — R209 Unspecified disturbances of skin sensation: Secondary | ICD-10-CM

## 2011-12-09 DIAGNOSIS — E119 Type 2 diabetes mellitus without complications: Secondary | ICD-10-CM | POA: Insufficient documentation

## 2011-12-09 DIAGNOSIS — E1165 Type 2 diabetes mellitus with hyperglycemia: Secondary | ICD-10-CM

## 2011-12-09 LAB — POCT GLYCOSYLATED HEMOGLOBIN (HGB A1C): Hemoglobin A1C: 7.9

## 2011-12-09 MED ORDER — METFORMIN HCL 500 MG PO TABS: 500.0000 mg | ORAL_TABLET | Freq: Two times a day (BID) | ORAL | Status: DC

## 2011-12-09 NOTE — Progress Notes (Signed)
Patient ID: Cathy Bond, female   DOB: 06/13/1971, 41 y.o.   MRN: [id number] Subjective: The patient is a 41 y.o. year old female who presents today for follow up of facial numbness and new diagnosis of DM.  1. Facial numbness: Patient was seen by ENT who ordered an MRI of the head. Review of this imaging does not demonstrate an obvious cause of her numbness. The numbness has not changed in character recently.  2. Diabetes: Patient was recently seen at urgent care for frequent urination was diagnosed with diabetes. She was not started on any treatment for this and she was not given significant amounts of information. She continues to have problems with frequent urination and feeling tired all the time. She's not having any visual changes, chest pain, shortness of breath, extremity weakness, or headaches.  Patient's past medical, social, and family history were reviewed and updated as appropriate. Smoking status: Nonsmoker  Objective:  Filed Vitals:   12/09/11 1426  BP: 125/86  Pulse: 73  Temp: 98.3 F (36.8 C)   Gen: No acute distress, overweight CV: Regular Resp: Clear bilaterally Ext: No edema  Assessment/Plan:   Please also see individual problems in problem list for problem-specific plans.  Greater than 25 minutes were spent with this patient over 50% of which was used and patient education and coordination of care.

## 2011-12-09 NOTE — Patient Instructions (Signed)
Your A1c is 7.9.  That is not bad at all! We will start a low dose of the medication Metformin to help with your bodies sensitivity to insulin. I am also making a referral to our diabetes educators.  We will get in touch with you in a few days about an appointment with them. I want you to limit the amount of starchy foods you are eating (corn, bread, potatos, pasta) to 2 servings per meal.  One serving of bread is a single slice.  Look on boxes for the serving amount of other foods.

## 2011-12-09 NOTE — Assessment & Plan Note (Addendum)
Patient information on initial management of diabetes. I am also making a referral to diabetes education. Patient's A1c of 7.9 is not terribly elevated. I will plan on seeing the patient several times over the next several months. I'm starting her on metformin and recommending an increase in exercise and weight loss. I will hold off on any more aggressive management until we get an idea of where she will settle out. In several weeks we will need to check a fasting lipid panel.

## 2011-12-09 NOTE — Assessment & Plan Note (Signed)
His hospital of the patient's elevated blood sugar could be related to her symptoms. It is slightly on, however, that this would be causing only unilateral symptoms. For now we will hold off on any additional intervention until her diabetes is appropriately controlled. At that time, if it is still bothering her, we could try a low dose of gabapentin.

## 2011-12-11 ENCOUNTER — Telehealth: Payer: Self-pay | Admitting: Family Medicine

## 2011-12-11 NOTE — Telephone Encounter (Signed)
States she started metformin yesterday . Had started flagyl 2 days before. Last night vomited X 1 felt tingling in both feet , legs and arms and face. Her sister checked her BS at 2:40 AM  With a reading of 211. She is feeling better today. She thinks the flagyl and metformin together are causing the problem and wants vaginal cream sent in. advised will try to contact MD.

## 2011-12-11 NOTE — Telephone Encounter (Signed)
Pt took Flagyl yesterday and her blood sugar went up "sky high" - needs to know what to do.

## 2011-12-11 NOTE — Telephone Encounter (Signed)
Dr. Louanne Belton notified and he advises for patient to leave off the metformin for the remainder of days she has left on the flagyl and then restart.  Patient states she has two days left of Flagyl and is agreeable to this plan.

## 2011-12-18 ENCOUNTER — Telehealth: Payer: Self-pay | Admitting: Family Medicine

## 2011-12-18 NOTE — Telephone Encounter (Signed)
Patient is calling about being set up for a Diabetic Class.  There is one beginning 6/6 at [facility] and she wants to be set up for that class, but doesn't know how to do it and she was under the impression that someone from [facility] was going to contact her about this.

## 2011-12-18 NOTE — Telephone Encounter (Signed)
I put in a referral for DM education, does anyone know where that referral goes or who to call?  I must admit I don't know.

## 2011-12-19 NOTE — Telephone Encounter (Signed)
Information on the referral was. Type Date User   General 12/10/2011 11:04 AM DALTON, MARIAN L       Summary    LEFT MESSAGE TO SCHEDULE         Note   CALLED PT  AND LEFT MESSAGE TO SCHEDULE

## 2011-12-23 ENCOUNTER — Encounter: Payer: Medicare Other | Attending: Family Medicine | Admitting: *Deleted

## 2011-12-23 DIAGNOSIS — Z713 Dietary counseling and surveillance: Secondary | ICD-10-CM | POA: Diagnosis not present

## 2011-12-23 DIAGNOSIS — E119 Type 2 diabetes mellitus without complications: Secondary | ICD-10-CM | POA: Insufficient documentation

## 2011-12-25 ENCOUNTER — Telehealth: Payer: Self-pay | Admitting: Family Medicine

## 2011-12-25 MED ORDER — GLUCOSE BLOOD VI STRP: ORAL_STRIP | Status: DC

## 2011-12-25 MED ORDER — ONETOUCH ULTRASOFT LANCETS MISC: Status: DC

## 2011-12-25 MED ORDER — ONETOUCH ULTRA 2 W/DEVICE KIT: PACK | Status: DC

## 2011-12-25 NOTE — Telephone Encounter (Signed)
Need a prescription for a glucometer as well as test strips.  Went to the class at the Diabetic facility and was informed they no longer give those out.  Please call patient when rx has been sent to pharmacy.

## 2011-12-25 NOTE — Telephone Encounter (Signed)
Please call to let her know I've sent it.

## 2011-12-26 ENCOUNTER — Telehealth: Payer: Self-pay | Admitting: *Deleted

## 2011-12-26 ENCOUNTER — Encounter: Payer: Self-pay | Admitting: *Deleted

## 2011-12-26 ENCOUNTER — Telehealth: Payer: Self-pay | Admitting: Family Medicine

## 2011-12-26 NOTE — Progress Notes (Signed)
  Patient was seen on 12/23/2011 for the first of a series of three diabetes self-management courses at [facility].  Current A1c = 7.9%  The following learning objectives were met by the patient during this course:   Defines the role of glucose and insulin  Identifies type of diabetes and pathophysiology  Defines the diagnostic criteria for diabetes and prediabetes  States the risk factors for Type 2 Diabetes  States the symptoms of Type 2 Diabetes  Defines Type 2 Diabetes treatment goals  Defines Type 2 Diabetes treatment options  States the rationale for glucose monitoring  Identifies A1C, glucose targets, and testing times  Identifies proper sharps disposal  Defines the purpose of a diabetes food plan  Identifies carbohydrate food groups  Defines effects of carbohydrate foods on glucose levels  Identifies carbohydrate choices/grams/food labels  States benefits of physical activity and effect on glucose  Review of suggested activity guidelines  Handouts given during class include:  Type 2 Diabetes: Basics Book  My Food Plan Book  Food and Activity Log  Follow-Up Plan: Core Class 2

## 2011-12-26 NOTE — Telephone Encounter (Signed)
Informed pt and she then told me that she is having and extended menstrual cycle. She said that she called in about this and followed the plan of care and is still having problems. Forwarded to pcp for advice.Cathy Bond

## 2011-12-26 NOTE — Telephone Encounter (Signed)
Patient states bleeding has not been real heavy. She changes pads three times daily. Never had irregular bleeding before. Has lasted now 3 weeks.  Appointment scheduled tomorrow .

## 2011-12-26 NOTE — Telephone Encounter (Signed)
Pt has been on her period for 3 weeks and wants to know what could cause this?

## 2011-12-26 NOTE — Telephone Encounter (Signed)
Pharmacy calling because they received e-rx for glucometer, test strips, and lancets.  Glucometer rx states "check CBG every morning before eating."  Lancets/test strips state to "use as directed."  Per insurance--Rx must specify how often patient is to check CBGs.  Rx written by Dr. Sheffield Slider for lancets and test strips to have patient check CBG every morning before eating.  Rx faxed to [facility]/[phone].  Gaylene Brooks, RN

## 2011-12-26 NOTE — Patient Instructions (Signed)
Goals:  Follow Diabetes Meal Plan as instructed  Eat 3 meals and 2 snacks, every 3-5 hrs  Limit carbohydrate intake to 30-45 grams carbohydrate/meal  Limit carbohydrate intake to 0-15 grams carbohydrate/snack  Add lean protein foods to meals/snacks  Monitor glucose levels as instructed by your doctor  Aim for 15-30 mins of physical activity daily  Bring food record and glucose log to your next nutrition visit   

## 2011-12-26 NOTE — Telephone Encounter (Signed)
I haven't had any phone calls or remember any conversations with the patient about extended periods.  If it is still bothering her she probably needs an appointment to discuss it and to have some testing done.

## 2011-12-27 ENCOUNTER — Encounter: Payer: Self-pay | Admitting: Family Medicine

## 2011-12-27 ENCOUNTER — Telehealth: Payer: Self-pay | Admitting: Family Medicine

## 2011-12-27 ENCOUNTER — Ambulatory Visit (INDEPENDENT_AMBULATORY_CARE_PROVIDER_SITE_OTHER): Payer: Medicare Other | Admitting: Family Medicine

## 2011-12-27 VITALS — BP 124/86 | HR 72 | Ht 64.0 in | Wt 174.0 lb

## 2011-12-27 DIAGNOSIS — N898 Other specified noninflammatory disorders of vagina: Secondary | ICD-10-CM

## 2011-12-27 DIAGNOSIS — N920 Excessive and frequent menstruation with regular cycle: Secondary | ICD-10-CM | POA: Diagnosis not present

## 2011-12-27 DIAGNOSIS — N939 Abnormal uterine and vaginal bleeding, unspecified: Secondary | ICD-10-CM

## 2011-12-27 LAB — POCT URINE PREGNANCY: Preg Test, Ur: NEGATIVE

## 2011-12-27 LAB — CBC
HCT: 37.2 % (ref 36.0–46.0)
MCH: 28.3 pg (ref 26.0–34.0)
MCHC: 34.7 g/dL (ref 30.0–36.0)
RDW: 16.1 % — ABNORMAL HIGH (ref 11.5–15.5)

## 2011-12-27 LAB — TSH: TSH: 0.809 u[IU]/mL (ref 0.350–4.500)

## 2011-12-27 MED ORDER — MEDROXYPROGESTERONE ACETATE 10 MG PO TABS: 10.0000 mg | ORAL_TABLET | Freq: Every day | ORAL | Status: DC

## 2011-12-27 MED ORDER — ONETOUCH ULTRASOFT LANCETS MISC: Status: DC

## 2011-12-27 MED ORDER — ONETOUCH ULTRA 2 W/DEVICE KIT: PACK | Status: DC

## 2011-12-27 MED ORDER — GLUCOSE BLOOD VI STRP: ORAL_STRIP | Status: DC

## 2011-12-27 NOTE — Progress Notes (Signed)
Addended by: Jennette Bill on: 12/27/2011 11:21 AM   Modules accepted: Orders

## 2011-12-27 NOTE — Patient Instructions (Signed)
It was a pleasure to see you today.  I am sending labs to evaluate possible causes of your prolonged vaginal bleeding.   I sent a prescription to your pharmacy for a medicine called Provera 10mg  tablets.  Take one tablet daily for the next 10 days.  This should cause your bleeding to taper off.   I would like you to have an ultrasound of the pelvis at [facility].   Follow up with Dr. Louanne Belton in the coming 1 to 2 weeks, or sooner if the bleeding becomes heavier.

## 2011-12-27 NOTE — Progress Notes (Signed)
  Subjective:    Patient ID: Cathy Bond, female    DOB: 1970/12/20, 41 y.o.   MRN: [id number]  HPI Patient here as work-in/SDA, for prolonged vaginal bleeding since mid-May.  LMP October 11, 2011, skipped April and then began in mid-May with bleeding which has required 3-5 pads/day since then.  Perhaps mildly slowing down now.  Seen UC on May 16 (just before bleeding started) with urinary frequency and diagnosed with DM and BV at that time, treated for BV and is not on Metformin for her DM.  Polyuria is better per her report today.   OB Hx: G5P5 with two high-risk pregnancies for preterm labor; s/p BTL over 20 yrs ago.  Patient is sexually active with female partner, does not use any form of hormonal contraception or treatment.  Social Hx: Nonsmoker (former smoker, quit over 2 yrs ago).   Family Hx; Mother and sister both had hysterectomies and thus no family menopausal history available.    Review of Systems No history of VTE events.  No dizziness or presyncope, no nausea/vomiting now; no fevers or chills. No dysuria.       Objective:   Physical Exam Well appearing, no apparent distress. Moist mucus membranes HEENT neck supple,conjunctivae not pale.  GU: Speculum exam with normal external genitalia and vaginal mucosa.  Marked clots around fornix and os; very slight red blood from ext os. Bimanual exam without tenderness.  No suprapubic tenderness       Assessment & Plan:

## 2011-12-27 NOTE — Assessment & Plan Note (Signed)
Patient with 3 weeks of abnormal vaginal bleeding. In setting recently diagnosed DM and missed period in April, appears likely anovulatory bleeding.  Upreg done today is negative (patient s/p BTL by history).  Will perform additional workup including CBC, TSH, FSH, and ordering TVUS to evaluate for possible fibroids as cause. Starting provera 10mg  daily for the coming 7 to 10 days; will have her follow up in the coming 1-2 weeks (sooner if bleeding becomes heavier, if she has symptoms associated with blood loss, or other problems arise).  She is clear on these instructions.  Discussed possible side effects of the provera at time of visit.

## 2011-12-28 LAB — FOLLICLE STIMULATING HORMONE: FSH: 7.6 m[IU]/mL

## 2011-12-30 ENCOUNTER — Encounter: Payer: Self-pay | Admitting: Family Medicine

## 2011-12-30 ENCOUNTER — Ambulatory Visit
Admission: RE | Admit: 2011-12-30 | Discharge: 2011-12-30 | Disposition: A | Discharge status: Home / Self Care | Payer: Medicare Other | Source: Ambulatory Visit | Attending: Family Medicine | Admitting: Family Medicine

## 2011-12-30 DIAGNOSIS — N939 Abnormal uterine and vaginal bleeding, unspecified: Secondary | ICD-10-CM

## 2011-12-30 DIAGNOSIS — N898 Other specified noninflammatory disorders of vagina: Secondary | ICD-10-CM | POA: Diagnosis not present

## 2011-12-30 DIAGNOSIS — N920 Excessive and frequent menstruation with regular cycle: Secondary | ICD-10-CM | POA: Diagnosis not present

## 2011-12-31 ENCOUNTER — Telehealth: Payer: Self-pay | Admitting: Family Medicine

## 2011-12-31 NOTE — Telephone Encounter (Signed)
Returned call to patient.  Left message to call our office back.  Teigan Manner Ann, RN  

## 2011-12-31 NOTE — Telephone Encounter (Signed)
Called patient to report results of [place] studies and to inquire about her welfare since visit last week.  Left voice message on cell phone. Paula Compton, MD

## 2011-12-31 NOTE — Telephone Encounter (Signed)
Patient is returning Dr. Erle Crocker call.  She also has a question about being diabetic and having a little pain in her left arm if she should take aspirin for that.

## 2012-01-01 NOTE — Telephone Encounter (Signed)
I called patient to report results of [place] and labwork.  She is no longer having vaginal bleeding.  I suspect anovulatory bleeding as the cause. Paula Compton, MD

## 2012-01-07 ENCOUNTER — Telehealth: Payer: Self-pay | Admitting: Family Medicine

## 2012-01-07 MED ORDER — GLUCOSE BLOOD VI STRP: ORAL_STRIP | Status: DC

## 2012-01-07 NOTE — Telephone Encounter (Signed)
Please ask patient what brand meter she has.  Script has been sent in previously (see telephone notes) but I will send in again once I know brand of meter.

## 2012-01-07 NOTE — Telephone Encounter (Signed)
Has been sent in. If pharmacy has specific form to fill out, have them fax it to [place].  I believe I have included all the required information (Dg code 250.00, instructions are to check bg once daily before breakfast).  Let me know if there are problems.

## 2012-01-07 NOTE — Telephone Encounter (Signed)
Pt is having a hard time getting her strips for her meter - she hasn't had a chance to check her sugar because they are telling her that they need a script from her doctor.  They are trying to charge her for it also and she has medicaid. Needs help.  [facility] [address]

## 2012-01-07 NOTE — Telephone Encounter (Signed)
LMOM informing of below.  

## 2012-01-07 NOTE — Telephone Encounter (Signed)
Patient is using the 1 touch Ultra 2. The pharmacy she is using is [facility]. She stated that she didn't know if they needed a written script or if it could be sent in. I told her that we would send it in first before we did a written script

## 2012-01-12 ENCOUNTER — Emergency Department (INDEPENDENT_AMBULATORY_CARE_PROVIDER_SITE_OTHER)
Admission: EM | Admit: 2012-01-12 | Discharge: 2012-01-12 | Disposition: A | Discharge status: Home / Self Care | Payer: Medicare Other | Source: Home / Self Care | Attending: Emergency Medicine | Admitting: Emergency Medicine

## 2012-01-12 ENCOUNTER — Encounter (HOSPITAL_COMMUNITY): Payer: Self-pay | Admitting: *Deleted

## 2012-01-12 DIAGNOSIS — E162 Hypoglycemia, unspecified: Secondary | ICD-10-CM

## 2012-01-12 NOTE — Discharge Instructions (Signed)

## 2012-01-12 NOTE — ED Provider Notes (Signed)
History     CSN: [id number]  Arrival date & time 01/12/12  1548   First MD Initiated Contact with Patient 01/12/12 1558      Chief Complaint  Patient presents with  . Shaking    (Consider location/radiation/quality/duration/timing/severity/associated sxs/prior treatment) HPI 41 year old with recent diagnosis of diabetes about a month ago and has been started on metformin presents with shaking in her arms bilaterally.  Patient has a glucometer, she has been working with Medicare/Medicaid to get the appropriate strips that she needs for her glucometer.  She has been working with her primary care physician's to acquire the test strips.  She has not been checking her blood sugars at home.  This afternoon she had her meal approximately around noon and around 4 she started having symptoms of shakiness in her arms bilaterally.  She thought that she had low blood sugar as a result took a piece of candy, since then her shakiness has gone away.  Blood sugar checked in urgent care was 123.  Past Medical History  Diagnosis Date  . Seasonal allergies   . Depression   . Anxiety   . BV (bacterial vaginosis)   . Trichomonas   . Yeast vaginitis   . Diabetes mellitus     Past Surgical History  Procedure Date  . Tubal ligation   . Toe nail     Family History  Problem Relation Age of Onset  . Diabetes Mother   . Stroke Mother   . Depression Sister   . Alcohol abuse Sister   . Diabetes Sister   . Depression Brother   . Diabetes Brother     History  Substance Use Topics  . Smoking status: Former Smoker -- 2.0 packs/day for 6 years    Types: Cigarettes  . Smokeless tobacco: Former Neurosurgeon    Quit date: 05/14/2011   Comment: used patches before  . Alcohol Use: 1.2 oz/week    2 Cans of beer per week    OB History    Grav Para Term Preterm Abortions TAB SAB Ect Mult Living                  Review of Systems  Constitutional: Negative.   HENT: Negative.   Eyes: Negative.     Respiratory: Negative.   Cardiovascular: Negative.   Gastrointestinal: Negative.   Genitourinary: Negative.   Musculoskeletal: Negative.   Skin: Negative.   Neurological: Negative.   Hematological: Negative.   Psychiatric/Behavioral: Negative.     Allergies  Wellbutrin  Home Medications   Current Outpatient Rx  Name Route Sig Dispense Refill  . ONETOUCH ULTRA 2 W/DEVICE KIT  Test blood sugar daily in the morning before eating 1 each 0    Diagnosis 250.0  . ONETOUCH ULTRASOFT LANCETS MISC  Test blood sugar daily before eating in the morning 100 each 12    Diagnosis 250.0  . METFORMIN HCL 500 MG PO TABS Oral Take 1 tablet (500 mg total) by mouth 2 (two) times daily with a meal. 180 tablet 3  . ADULT MULTIVITAMIN W/MINERALS CH Oral Take 1 tablet by mouth daily.    Marland Kitchen GLUCOSE BLOOD VI STRP  Test blood sugar one time daily in the morning before eating breakfast. ICD-9 250.00 100 each 12    Diagnosis code 250.00, DM2 without complications,  ...  . LORATADINE 10 MG PO TABS Oral Take 10 mg by mouth daily.    Marland Kitchen MEDROXYPROGESTERONE ACETATE 10 MG PO TABS Oral Take 1 tablet (  10 mg total) by mouth daily. 10 tablet 0  . METRONIDAZOLE 500 MG PO TABS Oral Take 500 mg by mouth 2 (two) times daily.    . MOMETASONE FUROATE 50 MCG/ACT NA SUSP Nasal Place 2 sprays into the nose daily as needed. FOR CONGESTION    . OMEPRAZOLE 40 MG PO CPDR Oral Take 1 capsule (40 mg total) by mouth daily. 30 capsule 3    BP 135/98  Pulse 82  Temp 98.2 F (36.8 C) (Oral)  Resp 17  SpO2 100%  LMP 12/27/2011  Physical Exam  Nursing note and vitals reviewed. Constitutional: She is oriented to person, place, and time. She appears well-developed and well-nourished.  HENT:  Head: Normocephalic and atraumatic.  Eyes: Conjunctivae are normal. Pupils are equal, round, and reactive to light.  Neck: Normal range of motion.  Cardiovascular: Normal rate and regular rhythm.   Pulmonary/Chest: Effort normal and breath  sounds normal.  Abdominal: Soft. Bowel sounds are normal.  Genitourinary:       Not done.  Musculoskeletal: Normal range of motion.  Neurological: She is alert and oriented to person, place, and time.       Cranial nerve 2-12 grossly intact.  5/5 motor strength in upper as well as lower extremities.  Romberg's negative.  Intact rapid movements in the hands bilaterally.  Skin: Skin is warm and dry.  Psychiatric: She has a normal mood and affect.    ED Course  Procedures (including critical care time)  Labs Reviewed  GLUCOSE, CAPILLARY - Abnormal; Notable for the following:    Glucose-Capillary 123 (*)     All other components within normal limits   No results found.   1. Hypoglycemia     MDM  Suspect patient's shakiness was related to hypoglycemia as patient was neurologically intact on my examination.  Spent time educating the patient on signs for hypoglycemia.  Patient reports that she has a followup appointment with her primary care doctor (Dr. Louanne Belton) next month.  She also has a followup appointment with outpatient [facility] for education.  Instructed the patient to continue her small meals.  Also instructed patient to take snacks in between her meals.  Instructed the patient if she has any signs of hypoglycemia she states take a snack before she checks her blood sugars once test strips are proved by Medicare/Medicaid.        Cristal Ford, MD 01/12/12 [phone]

## 2012-01-12 NOTE — ED Notes (Signed)
States was recently diagnosed w/ DM approx 1 month ago & started on metformin.  Has been having difficulty getting test strips for glucometer, so she has not been able to ever check her CBGs.  Approx 20 min PTA started to feel slightly lightheaded and shaky all over; ate some yogurt and candy - sxs resolved except for shaky feeling in right hand remaining.  States she also doesn't "feel right" whenever she takes her metformin - discussed the importance of f/u with her PCP.

## 2012-01-14 ENCOUNTER — Emergency Department (HOSPITAL_COMMUNITY)
Admission: EM | Admit: 2012-01-14 | Discharge: 2012-01-14 | Disposition: A | Discharge status: Home / Self Care | Payer: Medicare Other | Attending: Emergency Medicine | Admitting: Emergency Medicine

## 2012-01-14 ENCOUNTER — Ambulatory Visit: Payer: Medicare Other | Admitting: Family Medicine

## 2012-01-14 ENCOUNTER — Encounter (HOSPITAL_COMMUNITY): Payer: Self-pay | Admitting: *Deleted

## 2012-01-14 DIAGNOSIS — R21 Rash and other nonspecific skin eruption: Secondary | ICD-10-CM | POA: Diagnosis not present

## 2012-01-14 DIAGNOSIS — Z87891 Personal history of nicotine dependence: Secondary | ICD-10-CM | POA: Diagnosis not present

## 2012-01-14 DIAGNOSIS — L509 Urticaria, unspecified: Secondary | ICD-10-CM | POA: Diagnosis not present

## 2012-01-14 DIAGNOSIS — F411 Generalized anxiety disorder: Secondary | ICD-10-CM | POA: Insufficient documentation

## 2012-01-14 DIAGNOSIS — R0602 Shortness of breath: Secondary | ICD-10-CM | POA: Diagnosis not present

## 2012-01-14 DIAGNOSIS — Z823 Family history of stroke: Secondary | ICD-10-CM | POA: Insufficient documentation

## 2012-01-14 DIAGNOSIS — Z833 Family history of diabetes mellitus: Secondary | ICD-10-CM | POA: Diagnosis not present

## 2012-01-14 DIAGNOSIS — Z818 Family history of other mental and behavioral disorders: Secondary | ICD-10-CM | POA: Diagnosis not present

## 2012-01-14 DIAGNOSIS — E119 Type 2 diabetes mellitus without complications: Secondary | ICD-10-CM | POA: Diagnosis not present

## 2012-01-14 DIAGNOSIS — F329 Major depressive disorder, single episode, unspecified: Secondary | ICD-10-CM | POA: Insufficient documentation

## 2012-01-14 DIAGNOSIS — F3289 Other specified depressive episodes: Secondary | ICD-10-CM | POA: Insufficient documentation

## 2012-01-14 MED ORDER — FAMOTIDINE 20 MG PO TABS
20.0000 mg | ORAL_TABLET | Freq: Once | ORAL | Status: AC
  Administered 2012-01-14: 20 mg via ORAL
  Filled 2012-01-14: qty 1

## 2012-01-14 NOTE — Discharge Instructions (Signed)
Use of famotidine 20 mg twice a day; and Claritin 10 mg each day; for 1 week.   Hives Hives (urticaria) are itchy, red, swollen patches on the skin. They may change size, shape, and location quickly and repeatedly. Hives that occur deeper in the skin can cause swelling of the hands, feet, and face. Hives may be an allergic reaction to something you or your child ate, touched, or put on the skin. Hives can also be a reaction to cold, heat, viral infections, medication, insect bites, or emotional stress. Often the cause is hard to find. Hives can come and go for several days to several weeks. Hives are not contagious. HOME CARE INSTRUCTIONS   If the cause of the hives is known, avoid exposure to that source.   To relieve itching and rash:   Apply cold compresses to the skin or take cool water baths. Do not take or give your child hot baths or showers because the warmth will make the itching worse.   The best medicine for hives is an antihistamine. An antihistamine will not cure hives, but it will reduce their severity. You can use an antihistamine available over the counter. This medicine may make your child sleepy. Teenagers should not drive while using this medicine.   Take or give an antihistamine every 6 hours until the hives are completely gone for 24 hours or as directed.   Your child may have other medications prescribed for itching. Give these as directed by your child's caregiver.   You or your child should wear loose fitting clothing, including undergarments. Skin irritations may make hives worse.   Follow-up as directed by your caregiver.  SEEK MEDICAL CARE IF:   You or your child still have considerable itching after taking the medication (prescribed or purchased over the counter).   Joint swelling or pain occurs.  SEEK IMMEDIATE MEDICAL CARE IF:   You have a fever.   Swollen lips or tongue are noticed.   There is difficulty with breathing, swallowing, or tightness in the  throat or chest.   Abdominal pain develops.   Your child starts acting very sick.  These may be the first signs of a life-threatening allergic reaction. THIS IS AN EMERGENCY. Call 911 for medical help. MAKE SURE YOU:   Understand these instructions.   Will watch your condition.   Will get help right away if you are not doing well or get worse.  Document Released: 07/08/2005 Document Revised: 06/27/2011 Document Reviewed: 02/26/2008 [facility] Patient Information 2012 [place], [place].

## 2012-01-14 NOTE — ED Provider Notes (Signed)
History     CSN: [id number]  Arrival date & time 01/14/12  0809   First MD Initiated Contact with Patient 01/14/12 [phone]      Chief Complaint  Patient presents with  . Shortness of Breath  . Rash    (Consider location/radiation/quality/duration/timing/severity/associated sxs/prior treatment) HPI Comments: Cathy Bond is a 41 y.o. Female who was at a rash for 2 days, gradually worse, she took Claritin last night for it. She recently changed yogurts and feels like that might be the inciting cause. She has no weakness, dizziness, nausea, vomiting, chest pain, or shortness of breath. She's been using her usual medications. She does not know of any other aggravating factors.  The history is provided by the patient.    Past Medical History  Diagnosis Date  . Seasonal allergies   . Depression   . Anxiety   . BV (bacterial vaginosis)   . Trichomonas   . Yeast vaginitis   . Diabetes mellitus     Past Surgical History  Procedure Date  . Tubal ligation   . Toe nail     Family History  Problem Relation Age of Onset  . Diabetes Mother   . Stroke Mother   . Depression Sister   . Alcohol abuse Sister   . Diabetes Sister   . Depression Brother   . Diabetes Brother     History  Substance Use Topics  . Smoking status: Former Smoker -- 2.0 packs/day for 6 years    Types: Cigarettes  . Smokeless tobacco: Former Neurosurgeon    Quit date: 05/14/2011   Comment: used patches before  . Alcohol Use: 1.2 oz/week    2 Cans of beer per week    OB History    Grav Para Term Preterm Abortions TAB SAB Ect Mult Living                  Review of Systems  All other systems reviewed and are negative.    Allergies  Wellbutrin  Home Medications   Current Outpatient Rx  Name Route Sig Dispense Refill  . LORATADINE 10 MG PO TABS Oral Take 10 mg by mouth daily.    Marland Kitchen METFORMIN HCL 500 MG PO TABS Oral Take 500 mg by mouth 2 (two) times daily with a meal.    . ADULT MULTIVITAMIN  W/MINERALS CH Oral Take 1 tablet by mouth daily.    Marland Kitchen OMEPRAZOLE 40 MG PO CPDR Oral Take 40 mg by mouth daily.    Letta Pate ULTRA 2 W/DEVICE KIT  Test blood sugar daily in the morning before eating 1 each 0    Diagnosis 250.0  . GLUCOSE BLOOD VI STRP  Test blood sugar one time daily in the morning before eating breakfast. ICD-9 250.00 100 each 12    Diagnosis code 250.00, DM2 without complications,  ...  . ONETOUCH ULTRASOFT LANCETS MISC  Test blood sugar daily before eating in the morning 100 each 12    Diagnosis 250.0    BP 137/94  Pulse 75  Temp 98.2 F (36.8 C) (Oral)  Resp 16  Ht 5\' 4"  (1.626 m)  Wt 174 lb (78.926 kg)  BMI 29.87 kg/m2  SpO2 99%  LMP 12/27/2011  Physical Exam  Nursing note and vitals reviewed. Constitutional: She is oriented to person, place, and time. She appears well-developed and well-nourished.  HENT:  Head: Normocephalic and atraumatic.  Eyes: Conjunctivae and EOM are normal. Pupils are equal, round, and reactive to light.  Neck: Normal range of motion and phonation normal. Neck supple.  Cardiovascular: Normal rate, regular rhythm and intact distal pulses.   Pulmonary/Chest: Effort normal and breath sounds normal. She exhibits no tenderness.  Abdominal: Soft. She exhibits no distension. There is no tenderness. There is no guarding.  Musculoskeletal: Normal range of motion.  Neurological: She is alert and oriented to person, place, and time. She has normal strength. She exhibits normal muscle tone.  Skin: Skin is warm and dry. Rash (Generalized urticarial rash. No vesicles, or petechiae) noted.  Psychiatric: She has a normal mood and affect. Her behavior is normal. Judgment and thought content normal.    ED Course  Procedures (including critical care time)  Labs Reviewed - No data to display No results found.   1. Hives       MDM  Nonspecific urticarial rash. Possible food allergy. Doubt metabolic instability, serious bacterial infection or  impending vascular collapse; the patient is stable for discharge.   Plan: Home Medications- Claritin and Pepcid; Home Treatments- rest; Recommended follow up- PCP prn        Flint Melter, MD 01/14/12 [phone]

## 2012-01-14 NOTE — ED Notes (Signed)
Pt reports woke up with shortness of breath and rash to chest, bilateral arms. No difficulty breathing, airway intact. Pt states started metformin x 1 month ago, states also changed yogurt- unsure if that is why she is having reaction.

## 2012-01-24 ENCOUNTER — Encounter: Payer: Self-pay | Admitting: Family Medicine

## 2012-01-24 ENCOUNTER — Encounter (HOSPITAL_COMMUNITY): Payer: Self-pay | Admitting: *Deleted

## 2012-01-24 ENCOUNTER — Emergency Department (HOSPITAL_COMMUNITY)
Admission: EM | Admit: 2012-01-24 | Discharge: 2012-01-24 | Disposition: A | Discharge status: Home / Self Care | Payer: Medicare Other | Attending: Emergency Medicine | Admitting: Emergency Medicine

## 2012-01-24 ENCOUNTER — Ambulatory Visit (INDEPENDENT_AMBULATORY_CARE_PROVIDER_SITE_OTHER): Payer: Medicare Other | Admitting: Family Medicine

## 2012-01-24 VITALS — BP 127/82 | HR 67 | Temp 98.3°F | Ht 64.0 in | Wt 164.0 lb

## 2012-01-24 DIAGNOSIS — Z818 Family history of other mental and behavioral disorders: Secondary | ICD-10-CM | POA: Insufficient documentation

## 2012-01-24 DIAGNOSIS — F3289 Other specified depressive episodes: Secondary | ICD-10-CM | POA: Diagnosis not present

## 2012-01-24 DIAGNOSIS — E119 Type 2 diabetes mellitus without complications: Secondary | ICD-10-CM | POA: Insufficient documentation

## 2012-01-24 DIAGNOSIS — E1165 Type 2 diabetes mellitus with hyperglycemia: Secondary | ICD-10-CM

## 2012-01-24 DIAGNOSIS — Z823 Family history of stroke: Secondary | ICD-10-CM | POA: Diagnosis not present

## 2012-01-24 DIAGNOSIS — T7840XA Allergy, unspecified, initial encounter: Secondary | ICD-10-CM | POA: Diagnosis not present

## 2012-01-24 DIAGNOSIS — Z833 Family history of diabetes mellitus: Secondary | ICD-10-CM | POA: Insufficient documentation

## 2012-01-24 DIAGNOSIS — L509 Urticaria, unspecified: Secondary | ICD-10-CM | POA: Diagnosis not present

## 2012-01-24 DIAGNOSIS — IMO0001 Reserved for inherently not codable concepts without codable children: Secondary | ICD-10-CM

## 2012-01-24 DIAGNOSIS — F329 Major depressive disorder, single episode, unspecified: Secondary | ICD-10-CM | POA: Insufficient documentation

## 2012-01-24 DIAGNOSIS — F411 Generalized anxiety disorder: Secondary | ICD-10-CM | POA: Diagnosis not present

## 2012-01-24 DIAGNOSIS — IMO0002 Reserved for concepts with insufficient information to code with codable children: Secondary | ICD-10-CM

## 2012-01-24 DIAGNOSIS — L259 Unspecified contact dermatitis, unspecified cause: Secondary | ICD-10-CM | POA: Diagnosis not present

## 2012-01-24 MED ORDER — EPINEPHRINE 0.3 MG/0.3ML IJ DEVI: 0.3000 mg | INTRAMUSCULAR | Status: DC | PRN

## 2012-01-24 MED ORDER — FAMOTIDINE 20 MG PO TABS: 20.0000 mg | ORAL_TABLET | Freq: Two times a day (BID) | ORAL | Status: DC

## 2012-01-24 MED ORDER — HYDROXYZINE HCL 25 MG PO TABS: 25.0000 mg | ORAL_TABLET | Freq: Four times a day (QID) | ORAL | Status: AC

## 2012-01-24 MED ORDER — PREDNISONE 20 MG PO TABS: 40.0000 mg | ORAL_TABLET | Freq: Every day | ORAL | Status: AC

## 2012-01-24 MED ORDER — FAMOTIDINE 20 MG PO TABS
20.0000 mg | ORAL_TABLET | Freq: Once | ORAL | Status: AC
  Administered 2012-01-24: 20 mg via ORAL
  Filled 2012-01-24: qty 1

## 2012-01-24 MED ORDER — HYDROXYZINE HCL 25 MG PO TABS
50.0000 mg | ORAL_TABLET | Freq: Once | ORAL | Status: AC
  Administered 2012-01-24: 50 mg via ORAL
  Filled 2012-01-24: qty 2

## 2012-01-24 MED ORDER — PREDNISONE 20 MG PO TABS
40.0000 mg | ORAL_TABLET | Freq: Once | ORAL | Status: AC
  Administered 2012-01-24: 40 mg via ORAL
  Filled 2012-01-24: qty 2

## 2012-01-24 NOTE — ED Notes (Signed)
Patient reports breaking out in hives x 1 week. States that she started taking Metformin on Dec 09, 2011 for new onset diabetes.

## 2012-01-24 NOTE — Assessment & Plan Note (Signed)
Patient is unsure what precipitated this reaction, but she has been prescribed a course of steroids and given an epi pen. I've advised her to take this medications as prescribed. I discussed the idea of allergy testing the patient. As a reactions very nonspecific and has not included difficulty breathing I am recommending against this. At this time the patient is in agreement.

## 2012-01-24 NOTE — ED Notes (Signed)
Pt. Reports breaking out in hives since last week. No reported new medications. States that she started taking Metformin on Dec 09, 2011 for new onset diabetes.

## 2012-01-24 NOTE — Assessment & Plan Note (Signed)
Reported blood sugars are within desired limits, and will continue current therapy with metformin.

## 2012-01-24 NOTE — ED Provider Notes (Signed)
History     CSN: [id number]  Arrival date & time 01/24/12  0100   First MD Initiated Contact with Patient 01/24/12 0122      Chief Complaint  Patient presents with  . Urticaria    (Consider location/radiation/quality/duration/timing/severity/associated sxs/prior treatment) HPI History provided by patient. Rash started about a week ago. No new medications. Patient has had new soaps and detergents in this time frame and has also had some dietary changes including organic yogurt that she started eating. No new foods, medications or known ingestions otherwise. Moderate severity. No difficulty breathing. No throat fullness. No GU or palmar involvement of rash. Past Medical History  Diagnosis Date  . Seasonal allergies   . Depression   . Anxiety   . BV (bacterial vaginosis)   . Trichomonas   . Yeast vaginitis   . Diabetes mellitus     Past Surgical History  Procedure Date  . Tubal ligation   . Toe nail     Family History  Problem Relation Age of Onset  . Diabetes Mother   . Stroke Mother   . Depression Sister   . Alcohol abuse Sister   . Diabetes Sister   . Depression Brother   . Diabetes Brother     History  Substance Use Topics  . Smoking status: Former Smoker -- 2.0 packs/day for 6 years    Types: Cigarettes  . Smokeless tobacco: Former Neurosurgeon    Quit date: 05/14/2011   Comment: used patches before  . Alcohol Use: 1.2 oz/week    2 Cans of beer per week    OB History    Grav Para Term Preterm Abortions TAB SAB Ect Mult Living                  Review of Systems  Constitutional: Negative for fever and chills.  HENT: Negative for neck pain and neck stiffness.   Eyes: Negative for pain.  Respiratory: Negative for shortness of breath.   Cardiovascular: Negative for chest pain.  Gastrointestinal: Negative for abdominal pain.  Genitourinary: Negative for dysuria.  Musculoskeletal: Negative for back pain.  Skin: Positive for rash.  Neurological: Negative for  headaches.  All other systems reviewed and are negative.    Allergies  Wellbutrin  Home Medications   Current Outpatient Rx  Name Route Sig Dispense Refill  . LORATADINE 10 MG PO TABS Oral Take 10 mg by mouth daily.    Marland Kitchen METFORMIN HCL 500 MG PO TABS Oral Take 500 mg by mouth 2 (two) times daily with a meal.    . ADULT MULTIVITAMIN W/MINERALS CH Oral Take 1 tablet by mouth daily.    Marland Kitchen OMEPRAZOLE 40 MG PO CPDR Oral Take 40 mg by mouth daily.      BP 141/93  Pulse 74  Temp 98.1 F (36.7 C) (Oral)  Resp 16  Ht 5\' 4"  (1.626 m)  Wt 174 lb (78.926 kg)  BMI 29.87 kg/m2  SpO2 98%  LMP 01/22/2012  Physical Exam  Constitutional: She is oriented to person, place, and time. She appears well-developed and well-nourished.  HENT:  Head: Normocephalic and atraumatic.  Eyes: Conjunctivae and EOM are normal. Pupils are equal, round, and reactive to light.  Neck: Trachea normal. Neck supple. No thyromegaly present.  Cardiovascular: Normal rate, regular rhythm, S1 normal, S2 normal and normal pulses.     No systolic murmur is present   No diastolic murmur is present  Pulses:      Radial pulses are 2+  on the right side, and 2+ on the left side.  Pulmonary/Chest: Effort normal and breath sounds normal. She has no wheezes. She has no rhonchi. She has no rales. She exhibits no tenderness.  Abdominal: Soft. Normal appearance and bowel sounds are normal. There is no tenderness. There is no CVA tenderness and negative Murphy's sign.  Musculoskeletal:       BLE:s Calves nontender, no cords or erythema, negative Homans sign  Neurological: She is alert and oriented to person, place, and time. She has normal strength. No cranial nerve deficit or sensory deficit. GCS eye subscore is 4. GCS verbal subscore is 5. GCS motor subscore is 6.  Skin: Skin is warm and dry. She is not diaphoretic.       Generalized erythematous rash, is well blanching, his urticarial. Involves extremities and trunk and lower  face without oral or palmar involvement. No vesicles or petechiae.  Psychiatric: Her speech is normal.       Cooperative and appropriate    ED Course  Procedures (including critical care time)   Prednisone. Vistaril. Pepcid.     MDM   Rash c/w allergic reaction.   No airway involvement.  Stable for discharge with prescriptions provided. Patient encouraged to start a food diary. Plan followup primary care physician for any persistent symptoms return here for worsening condition.     Sunnie Nielsen, MD 01/24/12 [phone]

## 2012-01-24 NOTE — Progress Notes (Signed)
Subjective:     Patient ID: Cathy Bond, female   DOB: 28-Aug-1970, 41 y.o.   MRN: [id number]  HPI [name] is a 41 y/o woman with a history of recently diagnosed diabetes mellitus here for regularly scheduled follow up.   She reports that she feels well today, and that she feels she is doing well with her new diabetes medications. She states that she has been checking her blood sugars in the mornings before she eats, and that they are generally around 75.   She reports that she is tolerating the metformin well, and has not been experiencing any significant side effects. Specifically she has not been having any problems with hypoglycemia, chest pain, shortness of breath, visual changes, muscle aches.  She does report that she went to the emergency department last night for "hives." She says that the hives started about a week ago, and she is not sure what brought them on, although she did report using a new soap recently. Last night, she states that her tongue and throat began to feel "tingly," and so she decided to go to the emergency department. While at the ED she was prescribed steroids and an epi pen, and wants to discuss those prescriptions this morning.   Despite this, she says that feels well this morning. She denies fever, chills, shortness of breath, nausea, vomiting, or diarrhea. She denies any changes in her vision.   Review of Systems As per above.     Objective:   Physical Exam Generally appears well and in no acute distress.  CV: RRR, no murmurs rubs or gallps Pulm: Clear to auscultation bilaterally Abd: +BS, abdomen is soft and nontender Ext: No rash visible, no edema present.    Assessment:    41 y/o woman with recently diagnosed diabetes here today for regularly scheduled follow up, also reporting a recent visit to the emergency department for an apparent allergic type reaction.     Plan:   I have seen, examined and evaluated the patient, edited the above note,  and agree with the above. RITCH,ERIK 01/24/2012,5:15 PM

## 2012-02-05 ENCOUNTER — Telehealth: Payer: Self-pay | Admitting: Family Medicine

## 2012-02-05 NOTE — Telephone Encounter (Signed)
Patient was seen in ED on 01/24/12 & rx'd hydroxyzine and prednisone for an allergic reaction.  Finished meds on Sunday (02/02/12) and hives have returned.  Has hives on face, arms, back, and legs.  Areas itch and are worse when she is exposed to heat.  Patient states hives were better when she was on med.  Appt scheduled for tomorrow with [facility].  Gaylene Brooks, RN

## 2012-02-05 NOTE — Telephone Encounter (Signed)
Finished meds and still breaking out in hives and having having bad stomach cramps - wants to talk to nurse

## 2012-02-06 ENCOUNTER — Ambulatory Visit: Payer: Medicare Other

## 2012-02-13 ENCOUNTER — Encounter: Payer: Medicare Other | Attending: Family Medicine | Admitting: *Deleted

## 2012-02-13 DIAGNOSIS — IMO0002 Reserved for concepts with insufficient information to code with codable children: Secondary | ICD-10-CM

## 2012-02-13 DIAGNOSIS — E119 Type 2 diabetes mellitus without complications: Secondary | ICD-10-CM | POA: Diagnosis not present

## 2012-02-13 DIAGNOSIS — Z713 Dietary counseling and surveillance: Secondary | ICD-10-CM | POA: Insufficient documentation

## 2012-02-13 DIAGNOSIS — E1165 Type 2 diabetes mellitus with hyperglycemia: Secondary | ICD-10-CM

## 2012-02-19 ENCOUNTER — Encounter: Payer: Self-pay | Admitting: *Deleted

## 2012-02-19 NOTE — Patient Instructions (Signed)
Goals:  Follow Diabetes Meal Plan as instructed  Eat 3 meals and 2 snacks, every 3-5 hrs  Limit carbohydrate intake to 30-45 grams carbohydrate/meal  Limit carbohydrate intake to 0-15 grams carbohydrate/snack  Add lean protein foods to meals/snacks  Monitor glucose levels as instructed by your doctor  Aim for 05-30 mins of physical activity daily  Bring food record and glucose log to your next nutrition visit

## 2012-02-19 NOTE — Progress Notes (Signed)
  Patient was seen on 02/13/12 for the second of a series of three diabetes self-management courses at the [facility]. The following learning objectives were met by the patient during this course:   Explain basic nutrition maintenance and quality assurance  Describe causes, symptoms and treatment of hypoglycemia and hyperglycemia  Explain how to manage diabetes during illness  Describe the importance of good nutrition for health and healthy eating strategies  List strategies to follow meal plan when dining out  Describe the effects of alcohol on glucose and how to use it safely  Describe problem solving skills for day-to-day glucose challenges  Describe strategies to use when treatment plan needs to change  Identify important factors involved in successful weight loss  Describe ways to remain physically active  Describe the impact of regular activity on insulin resistance  Handouts given in class:  Refrigerator magnet for Sick Day Guidelines  [facility] Oral medication/insulin handout  Follow-Up Plan: Patient will attend the final class of the ADA Diabetes Self-Care Education.

## 2012-03-24 ENCOUNTER — Telehealth: Payer: Self-pay | Admitting: Family Medicine

## 2012-03-24 NOTE — Telephone Encounter (Signed)
Returned call to patient.  Has been having hives and itching since starting metformin.  Went to ED and was given hydroxyzine and prednisone.  Meds did not help.  C/o on hives on head, neck, arms, back, and legs.  Claritin helps with itching, but doesn't help with hives.  Patient states she takes metformin BID and this is when she notices the hives.  Appt scheduled for 03/26/12 at 10:15am with Dr. Louanne Belton.  Gaylene Brooks, RN

## 2012-03-24 NOTE — Telephone Encounter (Signed)
Still breaking out in hives and thinks it's from metformin.  Has been breaking out since she started taking this.  She saw on Internet that it can be caused by this drug.  Wants to talk to nurse about changing meds.

## 2012-03-26 ENCOUNTER — Ambulatory Visit: Payer: Medicare Other | Admitting: Family Medicine

## 2012-03-31 ENCOUNTER — Ambulatory Visit (INDEPENDENT_AMBULATORY_CARE_PROVIDER_SITE_OTHER): Payer: Medicare Other | Admitting: Family Medicine

## 2012-03-31 ENCOUNTER — Encounter: Payer: Self-pay | Admitting: Family Medicine

## 2012-03-31 VITALS — BP 123/73 | HR 93 | Temp 98.7°F | Ht 64.0 in | Wt 153.0 lb

## 2012-03-31 DIAGNOSIS — IMO0001 Reserved for inherently not codable concepts without codable children: Secondary | ICD-10-CM

## 2012-03-31 DIAGNOSIS — E1165 Type 2 diabetes mellitus with hyperglycemia: Secondary | ICD-10-CM

## 2012-03-31 DIAGNOSIS — IMO0002 Reserved for concepts with insufficient information to code with codable children: Secondary | ICD-10-CM

## 2012-03-31 MED ORDER — GLIPIZIDE 5 MG PO TABS: 5.0000 mg | ORAL_TABLET | Freq: Two times a day (BID) | ORAL | Status: DC

## 2012-03-31 NOTE — Progress Notes (Signed)
Patient ID: Cathy Bond, female   DOB: 07-22-1971, 41 y.o.   MRN: [id number] Subjective: The patient is a 41 y.o. year old female who presents today for hives.  Reports hives present for last 4-5 months, since starting metformin.  Pruritic papules, responsive to clariton.  No mucous membrane involvement.  No fevers/chills.  No n/v/d.  Wax and wane.  Sugars running 120s-180s.  Patient's past medical, social, and family history were reviewed and updated as appropriate. History  Substance Use Topics  . Smoking status: Former Smoker -- 2.0 packs/day for 6 years    Types: Cigarettes  . Smokeless tobacco: Former Neurosurgeon    Quit date: 05/14/2011   Comment: used patches before  . Alcohol Use: 1.2 oz/week    2 Cans of beer per week   Objective:  Filed Vitals:   03/31/12 1142  BP: 123/73  Pulse: 93  Temp: 98.7 F (37.1 C)   Gen: NAD Skin: faint reddish papules present on arms, none elsewhere.  No warmth or drainage.  Assessment/Plan:  Please also see individual problems in problem list for problem-specific plans.

## 2012-03-31 NOTE — Patient Instructions (Signed)
I want you to stop your metformin and start taking the medicine Glipizide.  Take it two times per day with meals. In two weeks if you are still having problems with hives, let me know and we will go back on the metformin. Your A1c today is great!

## 2012-03-31 NOTE — Assessment & Plan Note (Signed)
Possible rash related to metformin.  Will give 2 week trial off drug and on glipizide.  No concerns for SJS or other systemic condition.  A1c is much improved today.

## 2012-04-26 ENCOUNTER — Encounter (HOSPITAL_COMMUNITY): Payer: Self-pay | Admitting: *Deleted

## 2012-04-26 ENCOUNTER — Emergency Department (INDEPENDENT_AMBULATORY_CARE_PROVIDER_SITE_OTHER)
Admission: EM | Admit: 2012-04-26 | Discharge: 2012-04-26 | Disposition: A | Discharge status: Home / Self Care | Payer: Medicare Other | Source: Home / Self Care | Attending: Family Medicine | Admitting: Family Medicine

## 2012-04-26 DIAGNOSIS — K5900 Constipation, unspecified: Secondary | ICD-10-CM | POA: Diagnosis not present

## 2012-04-26 DIAGNOSIS — N949 Unspecified condition associated with female genital organs and menstrual cycle: Secondary | ICD-10-CM | POA: Diagnosis not present

## 2012-04-26 DIAGNOSIS — R102 Pelvic and perineal pain: Secondary | ICD-10-CM

## 2012-04-26 LAB — POCT URINALYSIS DIP (DEVICE)
Glucose, UA: NEGATIVE mg/dL
Hgb urine dipstick: NEGATIVE
Protein, ur: NEGATIVE mg/dL
Specific Gravity, Urine: <=1.005 (ref 1.005–1.030)
Urobilinogen, UA: 0.2 mg/dL (ref 0.0–1.0)

## 2012-04-26 LAB — WET PREP, GENITAL: Yeast Wet Prep HPF POC: NONE SEEN

## 2012-04-26 MED ORDER — METRONIDAZOLE 0.75 % VA GEL: 1.0000 | Freq: Two times a day (BID) | VAGINAL | Status: DC

## 2012-04-26 MED ORDER — TRAMADOL HCL 50 MG PO TABS: 50.0000 mg | ORAL_TABLET | Freq: Four times a day (QID) | ORAL | Status: DC | PRN

## 2012-04-26 MED ORDER — NAPROXEN 500 MG PO TABS: 500.0000 mg | ORAL_TABLET | Freq: Two times a day (BID) | ORAL | Status: DC

## 2012-04-26 NOTE — ED Notes (Signed)
Pt reports pelvic pain for the past two weeks no discharge or painful urination

## 2012-04-26 NOTE — ED Provider Notes (Signed)
History     CSN: [id number]  Arrival date & time 04/26/12  1003   First MD Initiated Contact with Patient 04/26/12 1006      Chief Complaint  Patient presents with  . Pelvic Pain    (Consider location/radiation/quality/duration/timing/severity/associated sxs/prior treatment) HPI Comments: 41 year old female with history of diabetes and recurrent BV. Here complaining of intermittent pelvic pain described as cramping for about 2 months. Symptoms associated with irregular vaginal bleeding. Patient said that she had her period on September 4 and then had her period again on September 27. Reports painful and heavy menstrual periods. Patient states that she had exacerbation of the pelvic discomfort in the last 2 weeks. Denies current vaginal bleeding or vaginal discharge. She reports that she had trichomonas over a year ago and has never been diagnosed with cervical cancer she had normal Pap smear in March last year. Denies nausea vomiting or diarrhea. Patient reports intermittent constipation has not had a bowel movement in the last 3 days has had hard stools before. No headache, no fever or chills. No dysuria, frequency or hematuria.   Past Medical History  Diagnosis Date  . Seasonal allergies   . Depression   . Anxiety   . BV (bacterial vaginosis)   . Trichomonas   . Yeast vaginitis   . Diabetes mellitus     Past Surgical History  Procedure Date  . Tubal ligation   . Toe nail     Family History  Problem Relation Age of Onset  . Diabetes Mother   . Stroke Mother   . Depression Sister   . Alcohol abuse Sister   . Diabetes Sister   . Depression Brother   . Diabetes Brother     History  Substance Use Topics  . Smoking status: Former Smoker -- 2.0 packs/day for 6 years    Types: Cigarettes  . Smokeless tobacco: Former Neurosurgeon    Quit date: 05/14/2011   Comment: used patches before  . Alcohol Use: 1.2 oz/week    2 Cans of beer per week    OB History    Grav Para Term  Preterm Abortions TAB SAB Ect Mult Living                  Review of Systems  Constitutional: Negative for fever and chills.  Gastrointestinal: Negative for nausea, vomiting, abdominal pain and diarrhea.  Genitourinary: Positive for vaginal discharge, menstrual problem and pelvic pain. Negative for dysuria, frequency, flank pain and vaginal bleeding.  Musculoskeletal: Negative for back pain.  Skin: Negative for rash.  Neurological: Negative for dizziness and headaches.    Allergies  Wellbutrin  Home Medications   Current Outpatient Rx  Name Route Sig Dispense Refill  . LORATADINE 10 MG PO TABS Oral Take 10 mg by mouth daily.    Marland Kitchen EPINEPHRINE 0.3 MG/0.3ML IJ DEVI Intramuscular Inject 0.3 mLs (0.3 mg total) into the muscle as needed. 1 Device 1  . FAMOTIDINE 20 MG PO TABS Oral Take 1 tablet (20 mg total) by mouth 2 (two) times daily. 30 tablet 0  . GLIPIZIDE 5 MG PO TABS Oral Take 1 tablet (5 mg total) by mouth 2 (two) times daily with a meal. 60 tablet 0  . METRONIDAZOLE 0.75 % VA GEL Vaginal Place 1 Applicatorful vaginally 2 (two) times daily. 70 g 0  . ADULT MULTIVITAMIN W/MINERALS CH Oral Take 1 tablet by mouth daily.    Marland Kitchen NAPROXEN 500 MG PO TABS Oral Take 1 tablet (500 mg  total) by mouth 2 (two) times daily with a meal. 20 tablet 0  . OMEPRAZOLE 40 MG PO CPDR Oral Take 40 mg by mouth daily.    . TRAMADOL HCL 50 MG PO TABS Oral Take 1 tablet (50 mg total) by mouth every 6 (six) hours as needed for pain. 15 tablet 0    BP 118/74  Pulse 80  Temp 98.8 F (37.1 C) (Oral)  Resp 16  SpO2 100%  LMP 03/26/2012  Physical Exam  Nursing note and vitals reviewed. Constitutional: She is oriented to person, place, and time. She appears well-developed and well-nourished. No distress.  Eyes: Conjunctivae normal are normal. No scleral icterus.  Neck: Neck supple. No thyromegaly present.  Cardiovascular: Normal heart sounds.   Pulmonary/Chest: Breath sounds normal.  Abdominal: Soft.  Bowel sounds are normal. She exhibits no distension and no mass. There is no tenderness. There is no guarding. Hernia confirmed negative in the right inguinal area and confirmed negative in the left inguinal area.  Genitourinary: Uterus is enlarged. Uterus is not tender. Cervix exhibits no motion tenderness, no discharge and no friability. Right adnexum displays no mass, no tenderness and no fullness. Left adnexum displays no mass, no tenderness and no fullness. There is erythema around the vagina. No bleeding around the vagina. Vaginal discharge found.    Lymphadenopathy:       Right: No inguinal adenopathy present.       Left: No inguinal adenopathy present.  Neurological: She is alert and oriented to person, place, and time.  Skin: No rash noted.    ED Course  Procedures (including critical care time)   Labs Reviewed  POCT URINALYSIS DIP (DEVICE)  POCT PREGNANCY, URINE  WET PREP, GENITAL  GC/CHLAMYDIA PROBE AMP, GENITAL   No results found.   1. Pelvic pain in female   2. Constipation       MDM  41 year old female here complaining of intermittent pelvic pain for about 2 months associated with dysmenorrhea. Negative Upreg test. 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 Treated with naproxen and vaginal discharge was treated empirically with MetroGel as patient cannot tolerate metronidazole oral. Asked to followup with her primary care provider and she would likely require referral for pelvic ultrasound and given consideration for endometrial biopsy. Red flags that should prompt her return to emergency services discussed with patient.         Sharin Grave, MD 04/28/12 1222

## 2012-04-27 ENCOUNTER — Other Ambulatory Visit: Payer: Self-pay | Admitting: *Deleted

## 2012-04-27 LAB — GC/CHLAMYDIA PROBE AMP, GENITAL: GC Probe Amp, Genital: NEGATIVE

## 2012-04-27 MED ORDER — GLUCOSE BLOOD VI STRP: ORAL_STRIP | Status: DC

## 2012-04-28 NOTE — ED Notes (Addendum)
GC/Chlamydia neg., Wet prep: clue cells TNTC, few WBC's.  Pt. adequately treated with Metrogel. Vassie Moselle 04/28/2012

## 2012-05-01 ENCOUNTER — Encounter: Payer: Self-pay | Admitting: Family Medicine

## 2012-05-01 ENCOUNTER — Ambulatory Visit (INDEPENDENT_AMBULATORY_CARE_PROVIDER_SITE_OTHER): Payer: Medicare Other | Admitting: Family Medicine

## 2012-05-01 VITALS — BP 118/66 | HR 71 | Temp 97.9°F | Ht 64.0 in | Wt 144.1 lb

## 2012-05-01 DIAGNOSIS — R634 Abnormal weight loss: Secondary | ICD-10-CM

## 2012-05-01 DIAGNOSIS — N938 Other specified abnormal uterine and vaginal bleeding: Secondary | ICD-10-CM | POA: Diagnosis not present

## 2012-05-01 DIAGNOSIS — N925 Other specified irregular menstruation: Secondary | ICD-10-CM | POA: Diagnosis not present

## 2012-05-01 DIAGNOSIS — N949 Unspecified condition associated with female genital organs and menstrual cycle: Secondary | ICD-10-CM | POA: Diagnosis not present

## 2012-05-01 DIAGNOSIS — N898 Other specified noninflammatory disorders of vagina: Secondary | ICD-10-CM | POA: Diagnosis not present

## 2012-05-01 DIAGNOSIS — N939 Abnormal uterine and vaginal bleeding, unspecified: Secondary | ICD-10-CM

## 2012-05-01 DIAGNOSIS — R109 Unspecified abdominal pain: Secondary | ICD-10-CM

## 2012-05-01 LAB — CBC WITH DIFFERENTIAL/PLATELET
Basophils Absolute: 0 10*3/uL (ref 0.0–0.1)
Basophils Relative: 0 % (ref 0–1)
Eosinophils Absolute: 0.1 10*3/uL (ref 0.0–0.7)
Lymphs Abs: 1.8 10*3/uL (ref 0.7–4.0)
MCH: 27.6 pg (ref 26.0–34.0)
MCHC: 34 g/dL (ref 30.0–36.0)
Neutrophils Relative %: 57 % (ref 43–77)
Platelets: 314 10*3/uL (ref 150–400)
RBC: 4.42 MIL/uL (ref 3.87–5.11)
RDW: 14.8 % (ref 11.5–15.5)

## 2012-05-01 LAB — COMPREHENSIVE METABOLIC PANEL
ALT: <8 U/L (ref 0–35)
AST: 10 U/L (ref 0–37)
Alkaline Phosphatase: 41 U/L (ref 39–117)
Sodium: 144 mEq/L (ref 135–145)
Total Bilirubin: 0.4 mg/dL (ref 0.3–1.2)
Total Protein: 6.8 g/dL (ref 6.0–8.3)

## 2012-05-01 MED ORDER — GLUCOSE BLOOD VI STRP: ORAL_STRIP | Status: DC

## 2012-05-01 MED ORDER — GLIPIZIDE 5 MG PO TABS: 5.0000 mg | ORAL_TABLET | Freq: Two times a day (BID) | ORAL | Status: DC

## 2012-05-01 NOTE — Progress Notes (Signed)
Patient ID: Cathy Bond, female   DOB: [birth year], 41 y.o.   MRN: [id number] Subjective: The patient is a 41 y.o. year old female who presents today for f/u.  Patient had extended period about 4 months ago.  Bleeding was going on for about 3 weeks and was given provera.  TVUS was ordered and was relatively unremarkable.  No pain at that time.  About 2 months ago began having problems with some abdominal pain (dull ache).  Then had episode of bleeding on 4th of Sept, then again on 18th and went through 26th when decided to take Provera and it stopped.  During that time began to have significant abdominal pain (cramping, sharp, lower abd pain with no radiation).  No fevers.  Patient also having problems with unexplained weight loss for last 4-5 months.  Unintentional.  Not exercising much, good appetitive.  Patient's past medical, social, and family history were reviewed and updated as appropriate. History  Substance Use Topics  . Smoking status: Former Smoker -- 2.0 packs/day for 6 years    Types: Cigarettes  . Smokeless tobacco: Former Neurosurgeon    Quit date: 05/14/2011   Comment: used patches before  . Alcohol Use: 1.2 oz/week    2 Cans of beer per week   Objective:  Filed Vitals:   05/01/12 0854  BP: 118/66  Pulse: 71  Temp: 97.9 F (36.6 C)   Gen: NAD, appropriate weight. HEENT: MMM, EOMI, thyroid not enlarged CV: RRR Resp: CTABL Abd: SNTND, no organomegaly Ext: No edema  Assessment/Plan:  Please also see individual problems in problem list for problem-specific plans.

## 2012-05-01 NOTE — Patient Instructions (Signed)
It was good to see you today! We will recheck some labs to see if we can get an idea why you are loosing weight. Please come back within 2 weeks for [place] to do an endometrial biopsy.

## 2012-05-03 DIAGNOSIS — R634 Abnormal weight loss: Secondary | ICD-10-CM | POA: Insufficient documentation

## 2012-05-03 DIAGNOSIS — R109 Unspecified abdominal pain: Secondary | ICD-10-CM | POA: Insufficient documentation

## 2012-05-03 NOTE — Assessment & Plan Note (Signed)
Unclear etiology.  TVUS shows thickened stripe.  Will have return for endometrial biopsy in 2 weeks.

## 2012-05-03 NOTE — Assessment & Plan Note (Signed)
None currently.  If returns would have low threshold for ordering CT as we do not have a good explanation.  Will hold off on any imaging for now as she is currently asymptomatic.

## 2012-05-03 NOTE — Assessment & Plan Note (Signed)
Plan to check thyroid, CBC, CMET.  If normal will continue to monitor.  No obvious explanation and patient's exam is relatively benign.

## 2012-05-08 ENCOUNTER — Encounter: Payer: Self-pay | Admitting: Family Medicine

## 2012-05-14 ENCOUNTER — Encounter: Payer: Self-pay | Admitting: Family Medicine

## 2012-05-14 ENCOUNTER — Ambulatory Visit (INDEPENDENT_AMBULATORY_CARE_PROVIDER_SITE_OTHER): Payer: Medicare Other | Admitting: Family Medicine

## 2012-05-14 VITALS — BP 126/78 | HR 66 | Temp 98.3°F | Ht 64.0 in | Wt 153.3 lb

## 2012-05-14 DIAGNOSIS — N920 Excessive and frequent menstruation with regular cycle: Secondary | ICD-10-CM | POA: Diagnosis not present

## 2012-05-14 MED ORDER — IBUPROFEN 800 MG PO TABS: 800.0000 mg | ORAL_TABLET | Freq: Three times a day (TID) | ORAL | Status: DC | PRN

## 2012-05-14 NOTE — Assessment & Plan Note (Signed)
Biopsy performed today.  Will contact patient with results and plan.

## 2012-05-14 NOTE — Patient Instructions (Signed)
Take 800mg  of Motrin three times per day for the next 2-3 days. I will call you to let you know about results of your test.  I would anticipate this would be early this coming week.

## 2012-05-14 NOTE — Progress Notes (Signed)
Patient ID: Cathy Bond, female   DOB: March 05, 1971, 41 y.o.   MRN: [id number] Subjective: The patient is a 41 y.o. year old female who presents today for endometrial biopsy  Patient's past medical, social, and family history were reviewed and updated as appropriate. History  Substance Use Topics  . Smoking status: Former Smoker -- 2.0 packs/day for 6 years    Types: Cigarettes  . Smokeless tobacco: Former Neurosurgeon    Quit date: 05/14/2011   Comment: used patches before  . Alcohol Use: 1.2 oz/week    2 Cans of beer per week   Objective:  Filed Vitals:   05/14/12 0852  BP: 126/78  Pulse: 66  Temp: 98.3 F (36.8 C)  ENDOMETRIAL BIOPSY: Patient was given informed consent, signed copy in the chart. Appropriate time out was taken. Sterile instruments including speculum were used. The cervix was brought into view with a speculum and then cleansed three times with betadine swab. A tenaculum was placed into the anterior lip of the cervix. A uterine sound was introduced into the uterus and then removed. A pipelle was introduced through the cervical os and into the endometrial cavity, suction was obtained, and an endometrial sample obtained using circular sampling with the pipelle. When an adequate sample was obtained, pipelle was withdrawn. Tenaculum and speculum removed. Bleeding was minimal. There were no complications. Post procedure instructions were given.   Assessment/Plan:  Please also see individual problems in problem list for problem-specific plans.

## 2012-05-15 DIAGNOSIS — D260 Other benign neoplasm of cervix uteri: Secondary | ICD-10-CM | POA: Diagnosis not present

## 2012-05-15 NOTE — Addendum Note (Signed)
Addended by: Majel Homer D on: 05/15/2012 09:23 AM   Modules accepted: Orders

## 2012-05-18 ENCOUNTER — Telehealth: Payer: Self-pay | Admitting: Family Medicine

## 2012-05-18 NOTE — Telephone Encounter (Signed)
Calling to inform her (doctor) that the specimen was sent in in formalin instead of saline which was written on the rec.

## 2012-05-19 NOTE — Telephone Encounter (Signed)
Ok. Thought it was saline but as long as they are able to run the specimen, that's fine

## 2012-05-27 ENCOUNTER — Telehealth: Payer: Self-pay | Admitting: Family Medicine

## 2012-05-27 DIAGNOSIS — N920 Excessive and frequent menstruation with regular cycle: Secondary | ICD-10-CM

## 2012-05-27 NOTE — Telephone Encounter (Signed)
Informed of inadequate sampling of endometrial tissue.  Will refer to GYN.  Patient has dysfunctional uterine bleeding and has failed medical management.  Had attempt at endometrial biopsy and was only able to sound to 4cm.  Sample shows only endocervix.  I wonder if she has a stenotic internal OS and would like GYN's help.  I feel that she needs sampling of endometrium.

## 2012-05-29 ENCOUNTER — Encounter: Payer: Self-pay | Admitting: Obstetrics & Gynecology

## 2012-06-11 ENCOUNTER — Encounter: Payer: Self-pay | Admitting: Obstetrics & Gynecology

## 2012-06-11 ENCOUNTER — Other Ambulatory Visit (HOSPITAL_COMMUNITY)
Admission: RE | Admit: 2012-06-11 | Discharge: 2012-06-11 | Disposition: A | Discharge status: Home / Self Care | Payer: Medicare Other | Source: Ambulatory Visit | Attending: Obstetrics & Gynecology | Admitting: Obstetrics & Gynecology

## 2012-06-11 ENCOUNTER — Ambulatory Visit (INDEPENDENT_AMBULATORY_CARE_PROVIDER_SITE_OTHER): Payer: Medicare Other | Admitting: Obstetrics & Gynecology

## 2012-06-11 VITALS — BP 129/82 | HR 81 | Temp 97.5°F | Resp 12 | Ht 64.0 in | Wt 148.3 lb

## 2012-06-11 DIAGNOSIS — N920 Excessive and frequent menstruation with regular cycle: Secondary | ICD-10-CM | POA: Insufficient documentation

## 2012-06-11 MED ORDER — MEGESTROL ACETATE 40 MG PO TABS: 40.0000 mg | ORAL_TABLET | Freq: Every day | ORAL | Status: DC

## 2012-06-11 NOTE — Progress Notes (Signed)
Patient ID: Cathy Bond, female   DOB: 02/03/1971, 41 y.o.   MRN: [id number]  Patient presents for evaluation of menorrhagia with passage of clots and some months she will have 2 cycles. She had been prescribed "steroids" from [facility] to help stop her bleeding. She has not had to take these since September.

## 2012-06-11 NOTE — Patient Instructions (Signed)
Dysfunctional Uterine Bleeding  Normally, menstrual periods begin between ages 11 to 17 in young women. A normal menstrual cycle/period may begin every 23 days up to 35 days and lasts from 1 to 7 days. Around 12 to 14 days before your menstrual period starts, ovulation (ovary produces an egg) occurs. When counting the time between menstrual periods, count from the first day of bleeding of the previous period to the first day of bleeding of the next period.  Dysfunctional (abnormal) uterine bleeding is bleeding that is different from a normal menstrual period. Your periods may come earlier or later than usual. They may be lighter, have blood clots or be heavier. You may have bleeding between periods, or you may skip one period or more. You may have bleeding after sexual intercourse, bleeding after menopause, or no menstrual period.  CAUSES   · Pregnancy (normal, miscarriage, tubal).  · IUDs (intrauterine device, birth control).  · Birth control pills.  · Hormone treatment.  · Menopause.  · Infection of the cervix.  · Blood clotting problems.  · Infection of the inside lining of the uterus.  · Endometriosis, inside lining of the uterus growing in the pelvis and other female organs.  · Adhesions (scar tissue) inside the uterus.  · Obesity or severe weight loss.  · Uterine polyps inside the uterus.  · Cancer of the vagina, cervix, or uterus.  · Ovarian cysts or polycystic ovary syndrome.  · Medical problems (diabetes, thyroid disease).  · Uterine fibroids (noncancerous tumor).  · Problems with your female hormones.  · Endometrial hyperplasia, very thick lining and enlarged cells inside of the uterus.  · Medicines that interfere with ovulation.  · Radiation to the pelvis or abdomen.  · Chemotherapy.  DIAGNOSIS   · Your doctor will discuss the history of your menstrual periods, medicines you are taking, changes in your weight, stress in your life, and any medical problems you may have.  · Your doctor will do a physical  and pelvic examination.  · Your doctor may want to perform certain tests to make a diagnosis, such as:  · Pap test.  · Blood tests.  · Cultures for infection.  · CT scan.  · Ultrasound.  · Hysteroscopy.  · Laparoscopy.  · MRI.  · Hysterosalpingography.  · D and C.  · Endometrial biopsy.  TREATMENT   Treatment will depend on the cause of the dysfunctional uterine bleeding (DUB). Treatment may include:  · Observing your menstrual periods for a couple of months.  · Prescribing medicines for medical problems, including:  · Antibiotics.  · Hormones.  · Birth control pills.  · Removing an IUD (intrauterine device, birth control).  · Surgery:  · D and C (scrape and remove tissue from inside the uterus).  · Laparoscopy (examine inside the abdomen with a lighted tube).  · Uterine ablation (destroy lining of the uterus with electrical current, laser, heat, or freezing).  · Hysteroscopy (examine cervix and uterus with a lighted tube).  · Hysterectomy (remove the uterus).  HOME CARE INSTRUCTIONS   · If medicines were prescribed, take exactly as directed. Do not change or switch medicines without consulting your caregiver.  · Long term heavy bleeding may result in iron deficiency. Your caregiver may have prescribed iron pills. They help replace the iron that your body lost from heavy bleeding. Take exactly as directed.  · Do not take aspirin or medicines that contain aspirin one week before or during your menstrual period. Aspirin may make   the bleeding worse.  · If you need to change your sanitary pad or tampon more than once every 2 hours, stay in bed with your feet elevated and a cold pack on your lower abdomen. Rest as much as possible, until the bleeding stops or slows down.  · Eat well-balanced meals. Eat foods high in iron. Examples are:  · Leafy green vegetables.  · Whole-grain breads and cereals.  · Eggs.  · Meat.  · Liver.  · Do not try to lose weight until the abnormal bleeding has stopped and your blood iron level is  back to normal. Do not lift more than ten pounds or do strenuous activities when you are bleeding.  · For a couple of months, make note on your calendar, marking the start and ending of your period, and the type of bleeding (light, medium, heavy, spotting, clots or missed periods). This is for your caregiver to better evaluate your problem.  SEEK MEDICAL CARE IF:   · You develop nausea (feeling sick to your stomach) and vomiting, dizziness, or diarrhea while you are taking your medicine.  · You are getting lightheaded or weak.  · You have any problems that may be related to the medicine you are taking.  · You develop pain with your DUB.  · You want to remove your IUD.  · You want to stop or change your birth control pills or hormones.  · You have any type of abnormal bleeding mentioned above.  · You are over 16 years old and have not had a menstrual period yet.  · You are 41 years old and you are still having menstrual periods.  · You have any of the symptoms mentioned above.  · You develop a rash.  SEEK IMMEDIATE MEDICAL CARE IF:   · An oral temperature above 102° F (38.9° C) develops.  · You develop chills.  · You are changing your sanitary pad or tampon more than once an hour.  · You develop abdominal pain.  · You pass out or faint.  Document Released: 07/05/2000 Document Revised: 09/30/2011 Document Reviewed: 06/06/2009  [employer]® Patient Information ©2013 [employer].

## 2012-06-11 NOTE — Progress Notes (Signed)
Patient ID: Cathy Bond, female   DOB: 05-07-1971, 41 y.o.   MRN: [id number] The pt is a 41 yo G5P5 who presents with c/o DUB.  Pt was seen by her primary care physician and an attempt was made to perform an endobx however, they only got endocervical cells and referred her here for Endo bx.    She c/o irrg menses since Sept.  Was Rx'd a 'steroid' to control her sx.  The indications for endometrial biopsy were reviewed.   Risks of the biopsy including cramping, bleeding, infection, uterine perforation, inadequate specimen and need for additional procedures  were discussed. The patient states she understands and agrees to undergo procedure today. Consent was signed. Time out was performed. Urine HCG was negative. A sterile speculum was placed in the patient's vagina and the cervix was prepped with Betadine. A single-toothed tenaculum was placed on the anterior lip of the cervix to stabilize it. The 3 mm pipelle was introduced into the endometrial cavity without difficulty to a depth of 8cm, and a moderate amount of tissue was obtained and sent to pathology. The instruments were removed from the patient's vagina. Minimal bleeding from the cervix was noted. The patient tolerated the procedure well. Routine post-procedure instructions were given to the patient. The patient will follow up in 3 months or sooner prn abnormal results or if treatment is not effective.  Deric Bocock L. Harraway-Smith, M.D., [facility]

## 2012-06-12 LAB — CBC
HCT: 35.3 % — ABNORMAL LOW (ref 36.0–46.0)
Hemoglobin: 12.1 g/dL (ref 12.0–15.0)
MCH: 27.7 pg (ref 26.0–34.0)
MCHC: 34.3 g/dL (ref 30.0–36.0)
RBC: 4.37 MIL/uL (ref 3.87–5.11)

## 2012-06-22 ENCOUNTER — Telehealth: Payer: Self-pay | Admitting: General Practice

## 2012-06-22 NOTE — Telephone Encounter (Signed)
Called patient and informed her of normal Endo bx. Patient verbalized understanding and had no further questions

## 2012-06-22 NOTE — Telephone Encounter (Signed)
Message copied by Kathee Delton on Mon Jun 22, 2012  1:12 PM ------      Message from: Cathy Bond      Created: Mon Jun 22, 2012 11:00 AM       Please call pt and notify her that her Endobx is negative.            Thx,      clh-S

## 2012-08-26 ENCOUNTER — Other Ambulatory Visit: Payer: Self-pay | Admitting: *Deleted

## 2012-08-26 MED ORDER — GLIPIZIDE 5 MG PO TABS: 5.0000 mg | ORAL_TABLET | Freq: Two times a day (BID) | ORAL | Status: DC

## 2012-09-06 ENCOUNTER — Inpatient Hospital Stay (HOSPITAL_COMMUNITY)
Admission: EM | Admit: 2012-09-06 | Discharge: 2012-09-07 | DRG: 311 | Disposition: A | Discharge status: Home / Self Care | Payer: Medicare Other | Attending: Family Medicine | Admitting: Family Medicine

## 2012-09-06 ENCOUNTER — Emergency Department (HOSPITAL_COMMUNITY): Payer: Medicare Other

## 2012-09-06 ENCOUNTER — Encounter (HOSPITAL_COMMUNITY): Payer: Self-pay | Admitting: Emergency Medicine

## 2012-09-06 DIAGNOSIS — R030 Elevated blood-pressure reading, without diagnosis of hypertension: Secondary | ICD-10-CM | POA: Diagnosis present

## 2012-09-06 DIAGNOSIS — Z87891 Personal history of nicotine dependence: Secondary | ICD-10-CM | POA: Diagnosis not present

## 2012-09-06 DIAGNOSIS — Z79899 Other long term (current) drug therapy: Secondary | ICD-10-CM | POA: Diagnosis not present

## 2012-09-06 DIAGNOSIS — Z7982 Long term (current) use of aspirin: Secondary | ICD-10-CM | POA: Diagnosis not present

## 2012-09-06 DIAGNOSIS — I200 Unstable angina: Secondary | ICD-10-CM | POA: Diagnosis present

## 2012-09-06 DIAGNOSIS — E119 Type 2 diabetes mellitus without complications: Secondary | ICD-10-CM | POA: Diagnosis not present

## 2012-09-06 DIAGNOSIS — Z823 Family history of stroke: Secondary | ICD-10-CM | POA: Diagnosis not present

## 2012-09-06 DIAGNOSIS — F329 Major depressive disorder, single episode, unspecified: Secondary | ICD-10-CM | POA: Diagnosis present

## 2012-09-06 DIAGNOSIS — L500 Allergic urticaria: Secondary | ICD-10-CM | POA: Diagnosis not present

## 2012-09-06 DIAGNOSIS — K219 Gastro-esophageal reflux disease without esophagitis: Secondary | ICD-10-CM

## 2012-09-06 DIAGNOSIS — I10 Essential (primary) hypertension: Secondary | ICD-10-CM | POA: Diagnosis present

## 2012-09-06 DIAGNOSIS — R072 Precordial pain: Secondary | ICD-10-CM | POA: Diagnosis not present

## 2012-09-06 DIAGNOSIS — F411 Generalized anxiety disorder: Secondary | ICD-10-CM | POA: Diagnosis present

## 2012-09-06 DIAGNOSIS — R071 Chest pain on breathing: Secondary | ICD-10-CM | POA: Diagnosis not present

## 2012-09-06 DIAGNOSIS — R079 Chest pain, unspecified: Secondary | ICD-10-CM

## 2012-09-06 DIAGNOSIS — Z6379 Other stressful life events affecting family and household: Secondary | ICD-10-CM | POA: Diagnosis not present

## 2012-09-06 DIAGNOSIS — F3289 Other specified depressive episodes: Secondary | ICD-10-CM | POA: Diagnosis present

## 2012-09-06 DIAGNOSIS — R0789 Other chest pain: Secondary | ICD-10-CM | POA: Diagnosis present

## 2012-09-06 DIAGNOSIS — L298 Other pruritus: Secondary | ICD-10-CM | POA: Diagnosis not present

## 2012-09-06 DIAGNOSIS — Z833 Family history of diabetes mellitus: Secondary | ICD-10-CM

## 2012-09-06 DIAGNOSIS — L2989 Other pruritus: Secondary | ICD-10-CM | POA: Diagnosis not present

## 2012-09-06 DIAGNOSIS — IMO0002 Reserved for concepts with insufficient information to code with codable children: Secondary | ICD-10-CM | POA: Diagnosis present

## 2012-09-06 LAB — URINE MICROSCOPIC-ADD ON

## 2012-09-06 LAB — CBC WITH DIFFERENTIAL/PLATELET
Basophils Absolute: 0 10*3/uL (ref 0.0–0.1)
HCT: 36.9 % (ref 36.0–46.0)
Lymphocytes Relative: 41 % (ref 12–46)
Neutro Abs: 2.5 10*3/uL (ref 1.7–7.7)
Neutrophils Relative %: 47 % (ref 43–77)
Platelets: 271 10*3/uL (ref 150–400)
RDW: 13.3 % (ref 11.5–15.5)
WBC: 5.3 10*3/uL (ref 4.0–10.5)

## 2012-09-06 LAB — POCT I-STAT, CHEM 8
BUN: 17 mg/dL (ref 6–23)
Chloride: 103 mEq/L (ref 96–112)
HCT: 39 % (ref 36.0–46.0)
Sodium: 138 mEq/L (ref 135–145)

## 2012-09-06 LAB — URINALYSIS, ROUTINE W REFLEX MICROSCOPIC
Hgb urine dipstick: NEGATIVE
Ketones, ur: NEGATIVE mg/dL
Protein, ur: NEGATIVE mg/dL
Urobilinogen, UA: 0.2 mg/dL (ref 0.0–1.0)

## 2012-09-06 LAB — PRO B NATRIURETIC PEPTIDE: Pro B Natriuretic peptide (BNP): <5 pg/mL (ref 0–125)

## 2012-09-06 LAB — GLUCOSE, CAPILLARY: Glucose-Capillary: 82 mg/dL (ref 70–99)

## 2012-09-06 LAB — TROPONIN I: Troponin I: <0.3 ng/mL (ref <0.30)

## 2012-09-06 LAB — POCT I-STAT TROPONIN I: Troponin i, poc: 0 ng/mL (ref 0.00–0.08)

## 2012-09-06 MED ORDER — INSULIN ASPART 100 UNIT/ML ~~LOC~~ SOLN
0.0000 [IU] | Freq: Three times a day (TID) | SUBCUTANEOUS | Status: DC
  Administered 2012-09-07 (×2): 1 [IU] via SUBCUTANEOUS

## 2012-09-06 MED ORDER — DIPHENHYDRAMINE HCL 25 MG PO CAPS
25.0000 mg | ORAL_CAPSULE | Freq: Four times a day (QID) | ORAL | Status: DC | PRN
  Administered 2012-09-06: 25 mg via ORAL
  Filled 2012-09-06: qty 1

## 2012-09-06 MED ORDER — ASPIRIN EC 81 MG PO TBEC
81.0000 mg | DELAYED_RELEASE_TABLET | Freq: Every day | ORAL | Status: DC
  Administered 2012-09-07: 81 mg via ORAL
  Filled 2012-09-06: qty 1

## 2012-09-06 MED ORDER — ASPIRIN 81 MG PO CHEW
324.0000 mg | CHEWABLE_TABLET | Freq: Once | ORAL | Status: AC
  Administered 2012-09-06: 324 mg via ORAL
  Filled 2012-09-06: qty 4

## 2012-09-06 MED ORDER — ATORVASTATIN CALCIUM 20 MG PO TABS
20.0000 mg | ORAL_TABLET | Freq: Every day | ORAL | Status: DC
  Filled 2012-09-06: qty 1

## 2012-09-06 MED ORDER — HEPARIN (PORCINE) IN NACL 100-0.45 UNIT/ML-% IJ SOLN
900.0000 [IU]/h | INTRAMUSCULAR | Status: DC
  Administered 2012-09-06: 900 [IU]/h via INTRAVENOUS
  Filled 2012-09-06 (×2): qty 250

## 2012-09-06 MED ORDER — ACETAMINOPHEN 325 MG PO TABS: 650.0000 mg | ORAL_TABLET | ORAL | Status: DC | PRN

## 2012-09-06 MED ORDER — CARVEDILOL 3.125 MG PO TABS
3.1250 mg | ORAL_TABLET | Freq: Two times a day (BID) | ORAL | Status: DC
  Administered 2012-09-07: 3.125 mg via ORAL
  Filled 2012-09-06 (×3): qty 1

## 2012-09-06 MED ORDER — ONDANSETRON HCL 4 MG/2ML IJ SOLN: 4.0000 mg | Freq: Four times a day (QID) | INTRAMUSCULAR | Status: DC | PRN

## 2012-09-06 MED ORDER — METOPROLOL TARTRATE 1 MG/ML IV SOLN: 5.0000 mg | INTRAVENOUS | Status: DC | PRN

## 2012-09-06 MED ORDER — NITROGLYCERIN 2 % TD OINT
0.5000 [in_us] | TOPICAL_OINTMENT | Freq: Four times a day (QID) | TRANSDERMAL | Status: DC
  Administered 2012-09-06 – 2012-09-07 (×2): 0.5 [in_us] via TOPICAL
  Filled 2012-09-06: qty 30

## 2012-09-06 MED ORDER — NITROGLYCERIN 0.4 MG SL SUBL: 0.4000 mg | SUBLINGUAL_TABLET | SUBLINGUAL | Status: DC | PRN

## 2012-09-06 MED ORDER — HEPARIN BOLUS VIA INFUSION
4000.0000 [IU] | Freq: Once | INTRAVENOUS | Status: AC
  Administered 2012-09-06: 4000 [IU] via INTRAVENOUS

## 2012-09-06 MED ORDER — SODIUM CHLORIDE 0.9 % IV SOLN: INTRAVENOUS | Status: DC

## 2012-09-06 NOTE — ED Notes (Signed)
Pt c/o cp x 2 days with nausea, shortness of breath, and dizziness. Pain radiates to neck and left side jaw.

## 2012-09-06 NOTE — ED Provider Notes (Signed)
History     CSN: [id number]  Arrival date & time 09/06/12  1531   First MD Initiated Contact with Patient 09/06/12 1630      Chief Complaint  Patient presents with  . Chest Pain    (Consider location/radiation/quality/duration/timing/severity/associated sxs/prior treatment) Patient is a 42 y.o. female presenting with chest pain. The history is provided by the patient.  Chest Pain She has been having episodes of chest pain for the last week. Pains are sharp and located in the left anterior chest. Pain is nonradiating. It is not worse with breathing but it sometimes is worse if she raises her arm. Pain seems to consistently come on with exertion and has been coming on with progressively less exertion. Today, pain came on with walking only about 20 feet. There is associated dyspnea and nausea but no diaphoresis. Pain will last about 3-5 minutes before resolving. She's not had any nocturnal pain. She is also noted some pain in her lower back. Pain has been as severe as 8/10. She still has some mild, residual chest pain currently. She quit smoking 3 months ago and is diabetic. She denies history of hypertension or hyperlipidemia. There is no family history of premature coronary atherosclerosis in first degree relatives.  Past Medical History  Diagnosis Date  . Seasonal allergies   . Depression   . Anxiety   . BV (bacterial vaginosis)   . Trichomonas   . Yeast vaginitis   . Diabetes mellitus     Past Surgical History  Procedure Laterality Date  . Tubal ligation    . Toe nail      Family History  Problem Relation Age of Onset  . Diabetes Mother   . Stroke Mother   . Depression Sister   . Alcohol abuse Sister   . Diabetes Sister   . Depression Brother   . Diabetes Brother     History  Substance Use Topics  . Smoking status: Former Smoker -- 2.00 packs/day for 6 years    Types: Cigarettes  . Smokeless tobacco: Never Used     Comment: used patches before  . Alcohol Use: No     OB History   Grav Para Term Preterm Abortions TAB SAB Ect Mult Living                  Review of Systems  Cardiovascular: Positive for chest pain.  All other systems reviewed and are negative.    Allergies  Wellbutrin  Home Medications   Current Outpatient Rx  Name  Route  Sig  Dispense  Refill  . glipiZIDE (GLUCOTROL) 5 MG tablet   Oral   Take 1 tablet (5 mg total) by mouth 2 (two) times daily with a meal.   60 tablet   3   . ibuprofen (ADVIL,MOTRIN) 800 MG tablet   Oral   Take 1 tablet (800 mg total) by mouth every 8 (eight) hours as needed for pain.   30 tablet   0   . megestrol (MEGACE) 40 MG tablet   Oral   Take 1 tablet (40 mg total) by mouth daily.   30 tablet   2   . Multiple Vitamin (MULITIVITAMIN WITH MINERALS) TABS   Oral   Take 1 tablet by mouth daily.         Marland Kitchen EPINEPHrine (EPIPEN) 0.3 mg/0.3 mL DEVI   Intramuscular   Inject 0.3 mLs (0.3 mg total) into the muscle as needed.   1 Device   1  BP 121/100  Pulse 102  Temp(Src) 98.3 F (36.8 C) (Oral)  Resp 20  SpO2 97%  LMP 08/15/2012  Physical Exam  Nursing note and vitals reviewed.  42 year old female, resting comfortably and in no acute distress. Vital signs are significant for tachycardia with heart rate 102, and hypertension with blood pressure 121/100. Oxygen saturation is 97%, which is normal. Head is normocephalic and atraumatic. PERRLA, EOMI. Oropharynx is clear. Neck is nontender and supple without adenopathy or JVD. Back is nontender and there is no CVA tenderness. Lungs are clear without rales, wheezes, or rhonchi. Chest is nontender. Heart has regular rate and rhythm without murmur. Abdomen is soft, flat, nontender without masses or hepatosplenomegaly and peristalsis is normoactive. Extremities have no cyanosis or edema, full range of motion is present. Skin is warm and dry without rash. Neurologic: Mental status is normal, cranial nerves are intact, there are no  motor or sensory deficits.  ED Course  Procedures (including critical care time)  Results for orders placed during the hospital encounter of 09/06/12  CBC WITH DIFFERENTIAL      Result Value Range   WBC 5.3  4.0 - 10.5 K/uL   RBC 4.59  3.87 - 5.11 MIL/uL   Hemoglobin 13.3  12.0 - 15.0 g/dL   HCT 16.1  09.6 - 04.5 %   MCV 80.4  78.0 - 100.0 fL   MCH 29.0  26.0 - 34.0 pg   MCHC 36.0  30.0 - 36.0 g/dL   RDW 40.9  81.1 - 91.4 %   Platelets 271  150 - 400 K/uL   Neutrophils Relative 47  43 - 77 %   Neutro Abs 2.5  1.7 - 7.7 K/uL   Lymphocytes Relative 41  12 - 46 %   Lymphs Abs 2.2  0.7 - 4.0 K/uL   Monocytes Relative 5  3 - 12 %   Monocytes Absolute 0.2  0.1 - 1.0 K/uL   Eosinophils Relative 7 (*) 0 - 5 %   Eosinophils Absolute 0.4  0.0 - 0.7 K/uL   Basophils Relative 0  0 - 1 %   Basophils Absolute 0.0  0.0 - 0.1 K/uL  PRO B NATRIURETIC PEPTIDE      Result Value Range   Pro B Natriuretic peptide (BNP) <5.0  0 - 125 pg/mL  POCT I-STAT, CHEM 8      Result Value Range   Sodium 138  135 - 145 mEq/L   Potassium 3.7  3.5 - 5.1 mEq/L   Chloride 103  96 - 112 mEq/L   BUN 17  6 - 23 mg/dL   Creatinine, Ser 7.82  0.50 - 1.10 mg/dL   Glucose, Bld 956 (*) 70 - 99 mg/dL   Calcium, Ion 2.13  0.86 - 1.23 mmol/L   TCO2 27  0 - 100 mmol/L   Hemoglobin 13.3  12.0 - 15.0 g/dL   HCT 57.8  46.9 - 62.9 %  POCT I-STAT TROPONIN I      Result Value Range   Troponin i, poc 0.00  0.00 - 0.08 ng/mL   Comment 3           POCT PREGNANCY, URINE      Result Value Range   Preg Test, Ur NEGATIVE  NEGATIVE   Dg Chest 2 View  09/06/2012  *RADIOLOGY REPORT*  Clinical Data: Chest pain.  CHEST - 2 VIEW  Comparison: 08/01/2011  Findings: Heart and mediastinal contours are within normal limits. No focal opacities  or effusions.  No acute bony abnormality.  IMPRESSION: No active cardiopulmonary disease.   Original Report Authenticated By: Charlett Nose, M.D.      Date: 09/06/2012  Rate: 101  Rhythm: sinus  tachycardia  QRS Axis: normal  Intervals: normal  ST/T Wave abnormalities: nonspecific T wave changes  Conduction Disutrbances:none  Narrative Interpretation: Nonspecific T wave flattening in the anterolateral leads. When compared with ECG of 08/01/2011, nonspecific T wave flattening is now present.  Old EKG Reviewed: changes noted   1. Chest pain   2. Diabetes mellitus       MDM  Chest pain in a pattern that is very worrisome for unstable angina. She will be given aspirin and nitroglycerin and started on heparin.   She got complete relief of pain with one nitroglycerin. Arrangements will be made for her admission. Case is discussed with Dr. Berline Chough of family practice service who agrees to admit the patient.      Dione Booze, MD 09/06/12 [phone]

## 2012-09-06 NOTE — ED Notes (Signed)
Patient transported to X-ray 

## 2012-09-06 NOTE — H&P (Signed)
Family Medicine Teaching [facility] H&P Service Pager: [phone]  Patient name: Cathy Bond Medical record number: [id number] Date of birth: Jun 04, 1971 Age: 42 y.o. Gender: female  Primary Care Provider: Majel Homer, MD Consultants: Cardiology  CODE STATUS: Full  CC  Chest Pain   HPI  Cathy Bond is a 42 y.o. year old female presenting with 2-3 week history of mid sternal chest pain radiating to left chest, arm, back, and neck.  Worsened with any movement and exacerbated by exertion with persistent pain on exertion lasting for 2-3 minutes after stopping.  Had relief in ED with Nitroglycerin.  She has been unable to ambulate for the past week due to this pain and was encouraged to be evaluated in the ED by her sister.    She has a history significant for obesity with ~40# weight loss over the past 2 years.  Quit smoking 3 months ago. ~10 pack year history Hx of Diabetes Maternal Hx of Stroke in her 67s. Unknown paternal history.  She has been on Megace for her dysfunctional bleeding but stopped it ~ 3 weeks ago because she thought it may be exacerbating her symptoms.   ROS   Constitutional Fatigue and decreased activity as above  Infectious No fevers, no chills  Resp No cough, no congestion  Cardiac CP & DOE as above, no orthopnea,  GI No vomiting. No melana, no hematachezia  GU No dysuria  Skin No new rash  Neuro Prior hx of Left sided facial numbness, now resolved; NormalHead MRI in 2013     HISTORY  PMHx:  Past Medical History  Diagnosis Date  . Seasonal allergies   . Depression   . Anxiety   . BV (bacterial vaginosis)   . Trichomonas   . Yeast vaginitis   . Diabetes mellitus     PSHx: Past Surgical History  Procedure Laterality Date  . Tubal ligation    . Toe nail    . Endometrial biopsy  2 in 2013    negative    Social Hx: History   Social History  . Marital Status: Single    Spouse Name: N/A    Number of Children: N/A  . Years of  Education: N/A   Social History Main Topics  . Smoking status: Former Smoker -- 2.00 packs/day for 6 years    Types: Cigarettes  . Smokeless tobacco: Never Used     Comment: used patches before  . Alcohol Use: No  . Drug Use: No     Comment: No hx of IV use  . Sexually Active: Yes    Birth Control/ Protection: Surgical   Other Topics Concern  . None   Social History Narrative   Has moved in with daughter.  Unemployed - Disability since age 40 unknown reason.  10th grade education.          Family Hx: Family History  Problem Relation Age of Onset  . Diabetes Mother   . Stroke Mother   . Depression Sister   . Alcohol abuse Sister   . Diabetes Sister   . Depression Brother   . Diabetes Brother     Allergies: Allergies  Allergen Reactions  . Wellbutrin (Bupropion Hcl) Nausea Only    Home Medications: Prior to Admission medications   Medication Sig Start Date End Date Taking? Authorizing Provider  glipiZIDE (GLUCOTROL) 5 MG tablet Take 1 tablet (5 mg total) by mouth 2 (two) times daily with a meal. 08/26/12 08/26/13 Yes Brent Bulla,  MD  ibuprofen (ADVIL,MOTRIN) 800 MG tablet Take 1 tablet (800 mg total) by mouth every 8 (eight) hours as needed for pain. 05/14/12  Yes Brent Bulla, MD  megestrol (MEGACE) 40 MG tablet Take 1 tablet (40 mg total) by mouth daily. 06/11/12  Yes Willodean Rosenthal, MD  Multiple Vitamin (MULITIVITAMIN WITH MINERALS) TABS Take 1 tablet by mouth daily.   Yes Historical Provider, MD  EPINEPHrine (EPIPEN) 0.3 mg/0.3 mL DEVI Inject 0.3 mLs (0.3 mg total) into the muscle as needed. 01/24/12   Sunnie Nielsen, MD    OBJECTIVE  Vitals: Temp:  [98.3 F (36.8 C)] 98.3 F (36.8 C) (02/16 1544) Pulse Rate:  [80-102] 82 (02/16 1900) Resp:  [16-20] 17 (02/16 1900) BP: (121-137)/(85-100) 137/86 mmHg (02/16 1900) SpO2:  [97 %-100 %] 99 % (02/16 1900)  Weight: Wt Readings from Last 3 Encounters:  06/11/12 148 lb 4.8 oz (67.268 kg)  05/14/12 153 lb 4.8 oz  (69.536 kg)  05/01/12 144 lb 1.6 oz (65.363 kg)    I&Os: Yesterday:   This shift:     PE: GENERAL:  Adult AA  female. In no discomfort; no respiratory distress. PSYCH: Alert and appropriately interactive; Insight:Good   H&N: AT/Morris, trachea midline EENT:  MMM, no scleral icterus, EOMi HEART: RRR, S1/S2 heard, no murmur LUNGS: CTA B, no wheezes, no crackles CHEST:  TTP over left posterior Ribs 5-8 EXTREMITIES: Moves all 4 extremities spontaneously, warm well perfused, no edema, bilateral DP and PT pulses 2/4.     LABS: CBC BMET   Recent Labs Lab 09/06/12 1614 09/06/12 1639  WBC 5.3  --   HGB 13.3 13.3  HCT 36.9 39.0  PLT 271  --     Recent Labs Lab 09/06/12 1639  NA 138  K 3.7  CL 103  BUN 17  CREATININE 1.10  GLUCOSE 204*     URINE STUDIES: None  MICRO: None  IMAGING:  Dg Chest 2 View  09/06/2012  *RADIOLOGY REPORT*  Clinical Data: Chest pain.  CHEST - 2 VIEW  Comparison: 08/01/2011  Findings: Heart and mediastinal contours are within normal limits. No focal opacities or effusions.  No acute bony abnormality.  IMPRESSION: No active cardiopulmonary disease.   Original Report Authenticated By: Charlett Nose, M.D.    2/16 - EKG: Sinus Tachy @ 101, T wave flattening, no ischemia, R atrial enlargement  Medications:     Heparin Drip  Assessment & Plan  LOS: 90 42 y.o. year old female with Typical Chest Pain  # Unstable Angina (ACS): Midsternal/Left sided chest Pain with exertion, relieved with rest.  Improved with Nitro.  No hx of HTN but mildly Hypertensive in ED. Previously on Megace.  Family hx of early vascular disease (mother, stroke in 51s).  Smoker (quit 3 months ago)  Started on Heparin Drip in ED.    Nitro paste.    Cycle CE, Risk Stratify  2D ECHO  Coreg 3.125 bid  Consult to Cards for IP evaluation Stress Test vs Cath  # Elevated BP: Monitor  Start Coreg   IV metoprolol for BP > 160/95  # Diabetes:  A1 pending, on home  Glucotrol  SSI  # Hx of DUB: previously on Megace Consider increased risk for thromboembolic disease.  Less likely but given R atrial enlargement on EKG consider pulm htn from PE but no other sx to suggest PE and on Treatment Dose Heparin.  Consider further eval if otherwise negative cardiac work up.  No LE edema  ---  FEN  *NS KVO -Cardiac Diet --- PPx: Tx dose heparin  Disposition  Pt to be admitted to Telemetry.  No active Chest Pain.  On Heparin Drip. Cycle CEs.  Cardiology to see.   Andrena Mews, DO [facility] Resident - PGY-1 09/06/2012 8:05 PM

## 2012-09-06 NOTE — Progress Notes (Signed)
ANTICOAGULATION CONSULT NOTE - Initial Consult  Pharmacy Consult for heparin Indication: chest pain/ACS  Allergies  Allergen Reactions  . Wellbutrin (Bupropion Hcl) Nausea Only    Patient Measurements:   Heparin Dosing Weight: 67 kg  Vital Signs: Temp: 98.3 F (36.8 C) (02/16 1544) Temp src: Oral (02/16 1544) BP: 121/100 mmHg (02/16 1544) Pulse Rate: 102 (02/16 1544)  Labs:  Recent Labs  09/06/12 1614 09/06/12 1639  HGB 13.3 13.3  HCT 36.9 39.0  PLT 271  --   CREATININE  --  1.10    The CrCl is unknown because both a height and weight (above a minimum accepted value) are required for this calculation.   Medical History: Past Medical History  Diagnosis Date  . Seasonal allergies   . Depression   . Anxiety   . BV (bacterial vaginosis)   . Trichomonas   . Yeast vaginitis   . Diabetes mellitus     Medications:  Scheduled:  . aspirin  324 mg Oral Once   Infusions:    Assessment: 42 yo female with ACS/chest pain will be started on heparin therapy.  Baseline H/H 13.3/39.  No anticoagulation prior to admission. Goal of Therapy:  Heparin level 0.3-0.7 units/ml Monitor platelets by anticoagulation protocol: Yes   Plan:  1) Heparin 4000 units iv bolus x1, then start heparin drip at 900 units/hr 2) 6hr heparin level 3) Daily heparin level and CBC  Sohan Potvin, Tsz-Yin 09/06/2012,4:52 PM

## 2012-09-07 DIAGNOSIS — R079 Chest pain, unspecified: Secondary | ICD-10-CM

## 2012-09-07 DIAGNOSIS — K219 Gastro-esophageal reflux disease without esophagitis: Secondary | ICD-10-CM | POA: Diagnosis not present

## 2012-09-07 DIAGNOSIS — R0789 Other chest pain: Secondary | ICD-10-CM | POA: Diagnosis present

## 2012-09-07 LAB — HEMOGLOBIN A1C
Hgb A1c MFr Bld: 5.7 % — ABNORMAL HIGH (ref <5.7)
Mean Plasma Glucose: 117 mg/dL — ABNORMAL HIGH (ref <117)

## 2012-09-07 LAB — LIPID PANEL
Cholesterol: 128 mg/dL (ref 0–200)
HDL: 56 mg/dL (ref >39)
Total CHOL/HDL Ratio: 2.3 RATIO
Triglycerides: 84 mg/dL (ref <150)
VLDL: 17 mg/dL (ref 0–40)

## 2012-09-07 LAB — CBC
HCT: 33.7 % — ABNORMAL LOW (ref 36.0–46.0)
MCH: 28.3 pg (ref 26.0–34.0)
MCHC: 35.3 g/dL (ref 30.0–36.0)
RDW: 13.1 % (ref 11.5–15.5)

## 2012-09-07 LAB — GLUCOSE, CAPILLARY: Glucose-Capillary: 125 mg/dL — ABNORMAL HIGH (ref 70–99)

## 2012-09-07 LAB — HEPARIN LEVEL (UNFRACTIONATED): Heparin Unfractionated: 0.4 IU/mL (ref 0.30–0.70)

## 2012-09-07 LAB — BASIC METABOLIC PANEL
CO2: 23 mEq/L (ref 19–32)
Chloride: 108 mEq/L (ref 96–112)
GFR calc Af Amer: >90 mL/min (ref >90)
Potassium: 3.7 mEq/L (ref 3.5–5.1)

## 2012-09-07 MED ORDER — OMEPRAZOLE 20 MG PO CPDR: 20.0000 mg | DELAYED_RELEASE_CAPSULE | Freq: Two times a day (BID) | ORAL | Status: DC

## 2012-09-07 MED ORDER — CARVEDILOL 3.125 MG PO TABS: 3.1250 mg | ORAL_TABLET | Freq: Two times a day (BID) | ORAL | Status: DC

## 2012-09-07 MED ORDER — ASPIRIN 81 MG PO TBEC: 81.0000 mg | DELAYED_RELEASE_TABLET | Freq: Every day | ORAL | Status: DC

## 2012-09-07 MED ORDER — PANTOPRAZOLE SODIUM 40 MG PO TBEC
40.0000 mg | DELAYED_RELEASE_TABLET | Freq: Every day | ORAL | Status: DC
  Administered 2012-09-07: 40 mg via ORAL
  Filled 2012-09-07: qty 1

## 2012-09-07 MED ORDER — NITROGLYCERIN 0.3 MG SL SUBL: 0.3000 mg | SUBLINGUAL_TABLET | SUBLINGUAL | Status: DC | PRN

## 2012-09-07 MED ORDER — ATORVASTATIN CALCIUM 20 MG PO TABS: 20.0000 mg | ORAL_TABLET | Freq: Every day | ORAL | Status: DC

## 2012-09-07 NOTE — Progress Notes (Signed)
Family Medicine Teaching Service Daily Progress Note Service Page: [phone]  Subjective: Patient complaining of chest pain following eating this morning and was worsened with movement.  Objective: Temp:  [98.3 F (36.8 C)-98.5 F (36.9 C)] 98.3 F (36.8 C) (02/17 0500) Pulse Rate:  [71-102] 74 (02/17 0500) Resp:  [16-20] 18 (02/17 0500) BP: (111-143)/(63-100) 111/63 mmHg (02/17 0500) SpO2:  [97 %-100 %] 99 % (02/17 0500) Weight:  [154 lb (69.854 kg)] 154 lb (69.854 kg) (02/17 0500) Exam: General: no acute distress, sitting comfortably in bed Cardiovascular: rrr, no mrg Chest: minimal tenderness to palpation over left chest  Respiratory: CTAB, no wheezes or crackles Abdomen: soft, NT, ND Extremities: no edema  Results for orders placed during the hospital encounter of 09/06/12 (from the past 24 hour(s))  CBC WITH DIFFERENTIAL     Status: Abnormal   Collection Time    09/06/12  4:14 PM      Result Value Range   WBC 5.3  4.0 - 10.5 K/uL   RBC 4.59  3.87 - 5.11 MIL/uL   Hemoglobin 13.3  12.0 - 15.0 g/dL   HCT 45.4  09.8 - 11.9 %   MCV 80.4  78.0 - 100.0 fL   MCH 29.0  26.0 - 34.0 pg   MCHC 36.0  30.0 - 36.0 g/dL   RDW 14.7  82.9 - 56.2 %   Platelets 271  150 - 400 K/uL   Neutrophils Relative 47  43 - 77 %   Neutro Abs 2.5  1.7 - 7.7 K/uL   Lymphocytes Relative 41  12 - 46 %   Lymphs Abs 2.2  0.7 - 4.0 K/uL   Monocytes Relative 5  3 - 12 %   Monocytes Absolute 0.2  0.1 - 1.0 K/uL   Eosinophils Relative 7 (*) 0 - 5 %   Eosinophils Absolute 0.4  0.0 - 0.7 K/uL   Basophils Relative 0  0 - 1 %   Basophils Absolute 0.0  0.0 - 0.1 K/uL  POCT I-STAT TROPONIN I     Status: None   Collection Time    09/06/12  4:38 PM      Result Value Range   Troponin i, poc 0.00  0.00 - 0.08 ng/mL   Comment 3           POCT I-STAT, CHEM 8     Status: Abnormal   Collection Time    09/06/12  4:39 PM      Result Value Range   Sodium 138  135 - 145 mEq/L   Potassium 3.7  3.5 - 5.1 mEq/L   Chloride 103  96 - 112 mEq/L   BUN 17  6 - 23 mg/dL   Creatinine, Ser 1.30  0.50 - 1.10 mg/dL   Glucose, Bld 865 (*) 70 - 99 mg/dL   Calcium, Ion 7.84  6.96 - 1.23 mmol/L   TCO2 27  0 - 100 mmol/L   Hemoglobin 13.3  12.0 - 15.0 g/dL   HCT 29.5  28.4 - 13.2 %  PRO B NATRIURETIC PEPTIDE     Status: None   Collection Time    09/06/12  4:49 PM      Result Value Range   Pro B Natriuretic peptide (BNP) <5.0  0 - 125 pg/mL  POCT PREGNANCY, URINE     Status: None   Collection Time    09/06/12  5:53 PM      Result Value Range   Preg Test, Ur NEGATIVE  NEGATIVE  URINALYSIS, ROUTINE W REFLEX MICROSCOPIC     Status: Abnormal   Collection Time    09/06/12  6:05 PM      Result Value Range   Color, Urine YELLOW  YELLOW   APPearance CLOUDY (*) CLEAR   Specific Gravity, Urine 1.012  1.005 - 1.030   pH 5.0  5.0 - 8.0   Glucose, UA NEGATIVE  NEGATIVE mg/dL   Hgb urine dipstick NEGATIVE  NEGATIVE   Bilirubin Urine NEGATIVE  NEGATIVE   Ketones, ur NEGATIVE  NEGATIVE mg/dL   Protein, ur NEGATIVE  NEGATIVE mg/dL   Urobilinogen, UA 0.2  0.0 - 1.0 mg/dL   Nitrite NEGATIVE  NEGATIVE   Leukocytes, UA TRACE (*) NEGATIVE  URINE MICROSCOPIC-ADD ON     Status: Abnormal   Collection Time    09/06/12  6:05 PM      Result Value Range   Squamous Epithelial / LPF FEW (*) RARE   WBC, UA 0-2  <3 WBC/hpf  TROPONIN I     Status: None   Collection Time    09/06/12  8:22 PM      Result Value Range   Troponin I <0.30  <0.30 ng/mL  GLUCOSE, CAPILLARY     Status: None   Collection Time    09/06/12  8:34 PM      Result Value Range   Glucose-Capillary 82  70 - 99 mg/dL  HEPARIN LEVEL (UNFRACTIONATED)     Status: None   Collection Time    09/06/12 11:42 PM      Result Value Range   Heparin Unfractionated 0.40  0.30 - 0.70 IU/mL  CBC     Status: Abnormal   Collection Time    09/07/12  2:17 AM      Result Value Range   WBC 5.5  4.0 - 10.5 K/uL   RBC 4.21  3.87 - 5.11 MIL/uL   Hemoglobin 11.9 (*) 12.0 -  15.0 g/dL   HCT 16.1 (*) 09.6 - 04.5 %   MCV 80.0  78.0 - 100.0 fL   MCH 28.3  26.0 - 34.0 pg   MCHC 35.3  30.0 - 36.0 g/dL   RDW 40.9  81.1 - 91.4 %   Platelets 248  150 - 400 K/uL  TROPONIN I     Status: None   Collection Time    09/07/12  2:17 AM      Result Value Range   Troponin I <0.30  <0.30 ng/mL  BASIC METABOLIC PANEL     Status: Abnormal   Collection Time    09/07/12  2:17 AM      Result Value Range   Sodium 139  135 - 145 mEq/L   Potassium 3.7  3.5 - 5.1 mEq/L   Chloride 108  96 - 112 mEq/L   CO2 23  19 - 32 mEq/L   Glucose, Bld 148 (*) 70 - 99 mg/dL   BUN 15  6 - 23 mg/dL   Creatinine, Ser 7.82  0.50 - 1.10 mg/dL   Calcium 8.3 (*) 8.4 - 10.5 mg/dL   GFR calc non Af Amer 86 (*) >90 mL/min   GFR calc Af Amer >90  >90 mL/min  LIPID PANEL     Status: None   Collection Time    09/07/12  2:17 AM      Result Value Range   Cholesterol 128  0 - 200 mg/dL   Triglycerides 84  <956 mg/dL   HDL 56  >  39 mg/dL   Total CHOL/HDL Ratio 2.3     VLDL 17  0 - 40 mg/dL   LDL Cholesterol 55  0 - 99 mg/dL  GLUCOSE, CAPILLARY     Status: Abnormal   Collection Time    09/07/12  7:41 AM      Result Value Range   Glucose-Capillary 125 (*) 70 - 99 mg/dL   Comment 1 Notify RN    HEPARIN LEVEL (UNFRACTIONATED)     Status: None   Collection Time    09/07/12  8:23 AM      Result Value Range   Heparin Unfractionated 0.33  0.30 - 0.70 IU/mL   EKG: NSR, no ST changes  Imaging/Diagnostic Tests: CXR 09/06/12: no acute process  Scheduled Meds: . aspirin EC  81 mg Oral Daily  . atorvastatin  20 mg Oral q1800  . carvedilol  3.125 mg Oral BID WC  . insulin aspart  0-9 Units Subcutaneous TID WC  . nitroGLYCERIN  0.5 inch Topical Q6H   Continuous Infusions: . sodium chloride 10 mL/hr at 09/06/12 2030  . heparin 900 Units/hr (09/06/12 1709)   PRN Meds:.acetaminophen, diphenhydrAMINE, metoprolol, ondansetron (ZOFRAN) IV  Assessment/Plan: Patient is a 42 yo female who presented with  exertional mid-sternal CP radiating to the left chest, arm, back, and neck. Relieved with nitro in ED.  1. Unstable angina: midsternal/left sided chest pain with exertion, relieved with rest and improved with nitro. Non-specific T-wave changes yesterday, normal EKG this morning. -f/u CE for 3rd troponin -continue heparin drip until r/o -nitro paste prn chest pain -f/u A1c and TSH -f/u echo -continue coreg 3.125 BID -f/u cards c/s-question of whether she needs inpatient stress test vs cath for evaluation on CP  2. Elevated BP: to 140's/70's today -continue coreg -will monitor  3. DM:  -f/u A1c -continue SSI -hold home glucotrol  FEN/GI: KVO, heart healthy diet PPx: tx dose heparin, protonix Dispo: pending cardiology evaluation and finished work-up   Marikay Alar, MD 09/07/2012, 7:30 AM

## 2012-09-07 NOTE — H&P (Signed)
FMTS Attending Admission Note: Cathy Parenteau MD [phone] pager office [phone] I  have seen and examined this patient, reviewed their chart. I have discussed this patient with the resident. I agree with the resident's findings, assessment and care plan. 

## 2012-09-07 NOTE — Care Management Note (Signed)
    Page 1 of 1   09/07/2012     11:34:49 AM   CARE MANAGEMENT NOTE 09/07/2012  Patient:  Cathy Bond, Cathy Bond   Account Number:  [id number]  Date Initiated:  09/07/2012  Documentation initiated by:  GRAVES-BIGELOW,Dawanna Grauberger  Subjective/Objective Assessment:   Pt admitted with cp. Initiated on IV heparin.     Action/Plan:   No needs from CM at this time.   Anticipated DC Date:  09/07/2012   Anticipated DC Plan:  HOME/SELF CARE      DC Planning Services  CM consult      Choice offered to / List presented to:             Status of service:  Completed, signed off Medicare Important Message given?   (If response is "NO", the following Medicare IM given date fields will be blank) Date Medicare IM given:   Date Additional Medicare IM given:    Discharge Disposition:  HOME/SELF CARE  Per UR Regulation:  Reviewed for med. necessity/level of care/duration of stay  If discussed at Long Length of Stay Meetings, dates discussed:    Comments:

## 2012-09-07 NOTE — Progress Notes (Signed)
FMTS Attending Daily Note:  Renold Don MD  [phone] pager  Family Practice pager:  [phone] I have discussed this patient with the resident Dr. Birdie Sons and attending physician Dr. Jennette Kettle.  I agree with their findings, assessment, and care plan

## 2012-09-07 NOTE — Consult Note (Signed)
CARDIOLOGY CONSULT NOTE    Patient ID: Cathy Bond MRN: [id number] DOB/AGE: 10-14-1970 42 y.o.  Admit date: 09/06/2012 Referring Physician:  Jennette Kettle Primary Physician: Majel Homer, MD Primary Cardiologist:  New Reason for Consultation: Chest pain  Active Problems:   Midsternal chest pain   HPI:    42 yo female has been having episodes of chest pain for the last week. Pains are sharp and located in the left anterior chest. Pain is nonradiating. It is not worse with breathing but it sometimes is worse if she raises her arm. Pain seems to consistently come on with exertion and has been coming on with progressively less exertion. Today, pain came on with walking only about 20 feet. There is associated dyspnea and nausea but no diaphoresis. Pain will last about 3-5 minutes before resolving. She's not had any nocturnal pain. She is also noted some pain in her lower back. Pain has been as severe as 8/10. She still has some mild, residual chest pain currently. She quit smoking 3 months ago and is diabetic. She denies history of hypertension or hyperlipidemia. There is no family history of premature coronary atherosclerosis in first degree relatives.  In hospital feels better.  Has R/O with no acute ECG changes  Echo being done now and my review of images shows normal EF 60% with no RWMA;s and no pericardial effusion  @ROS @ All other systems reviewed and negative except as noted above  Past Medical History  Diagnosis Date  . Seasonal allergies   . Depression   . Anxiety   . BV (bacterial vaginosis)   . Trichomonas   . Yeast vaginitis   . Diabetes mellitus     Family History  Problem Relation Age of Onset  . Diabetes Mother   . Stroke Mother   . Depression Sister   . Alcohol abuse Sister   . Diabetes Sister   . Depression Brother   . Diabetes Brother     History   Social History  . Marital Status: Single    Spouse Name: N/A    Number of Children: N/A  . Years of Education: N/A    Occupational History  . Not on file.   Social History Main Topics  . Smoking status: Former Smoker -- 2.00 packs/day for 6 years    Types: Cigarettes  . Smokeless tobacco: Never Used     Comment: used patches before  . Alcohol Use: No  . Drug Use: No     Comment: No hx of IV use  . Sexually Active: Yes    Birth Control/ Protection: Surgical   Other Topics Concern  . Not on file   Social History Narrative   Has moved in with daughter.  Unemployed - Disability since age 68 unknown reason.  10th grade education.          Past Surgical History  Procedure Laterality Date  . Tubal ligation    . Toe nail    . Endometrial biopsy  2 in 2013    negative     . aspirin EC  81 mg Oral Daily  . atorvastatin  20 mg Oral q1800  . carvedilol  3.125 mg Oral BID WC  . insulin aspart  0-9 Units Subcutaneous TID WC  . nitroGLYCERIN  0.5 inch Topical Q6H  . pantoprazole  40 mg Oral Daily   . sodium chloride 10 mL/hr at 09/06/12 2030  . heparin 900 Units/hr (09/06/12 1709)    Physical Exam:   Affect appropriate  Healthy:  appears stated age HEENT: normal Neck supple with no adenopathy JVP normal no bruits no thyromegaly Lungs clear with no wheezing and good diaphragmatic motion Heart:  S1/S2 no murmur, no rub, gallop or click PMI normal Abdomen: benighn, BS positve, no tenderness, no AAA no bruit.  No HSM or HJR Distal pulses intact with no bruits No edema Neuro non-focal Skin warm and dry No muscular weakness   Labs:   Lab Results  Component Value Date   WBC 5.5 09/07/2012   HGB 11.9* 09/07/2012   HCT 33.7* 09/07/2012   MCV 80.0 09/07/2012   PLT 248 09/07/2012    Recent Labs Lab 09/07/12 0217  NA 139  K 3.7  CL 108  CO2 23  BUN 15  CREATININE 0.82  CALCIUM 8.3*  GLUCOSE 148*   Lab Results  Component Value Date   CKTOTAL 59 08/06/2011   TROPONINI <0.30 09/07/2012    Lab Results  Component Value Date   CHOL 128 09/07/2012   CHOL 123 02/26/2011   CHOL 147  10/23/2009   Lab Results  Component Value Date   HDL 56 09/07/2012   HDL 43 4/0/9811   HDL 51 10/23/2009   Lab Results  Component Value Date   LDLCALC 55 09/07/2012   LDLCALC 70 02/26/2011   LDLCALC 72 10/23/2009   Lab Results  Component Value Date   TRIG 84 09/07/2012   TRIG 50 02/26/2011   TRIG 120 10/23/2009   Lab Results  Component Value Date   CHOLHDL 2.3 09/07/2012   CHOLHDL 2.9 02/26/2011   CHOLHDL 2.9 Ratio 10/23/2009   No results found for this basename: LDLDIRECT      Radiology: Dg Chest 2 View  09/06/2012  *RADIOLOGY REPORT*  Clinical Data: Chest pain.  CHEST - 2 VIEW  Comparison: 08/01/2011  Findings: Heart and mediastinal contours are within normal limits. No focal opacities or effusions.  No acute bony abnormality.  IMPRESSION: No active cardiopulmonary disease.   Original Report Authenticated By: Charlett Nose, M.D.     EKG:  NSR nonspecific ST/T wave changes   ASSESSMENT AND PLAN:  Chest Pain.  Atypical improved with negative inpatient studies.  ECG with nonspecific ST/T wave changes.  Unable to walk on treadmill due to back pain Have arranged for Lexiscan myovue in our office Wendsday 2/19 at noon  Patient needs to be there at 11:45.  F/U with [facility].  If myovue Abnormal I will see in f/u.  Continue ASA and beta blocker Chol:  Continue statin  Signed: Charlton Haws 09/07/2012, 9:41 AM

## 2012-09-07 NOTE — Discharge Summary (Signed)
Physician Discharge Summary  Patient ID: SCHERRY LAVERNE MRN: [id number] DOB: 08-11-1970 Age: 42 y.o.  Admit date: 09/06/2012 Discharge date: 09/07/2012 Admitting Physician: Nestor Ramp, MD  PCP: Majel Homer, MD  Consultants: cardiology, Dr. Eden Emms   Discharge Diagnosis: Principal Problem:   Midsternal chest pain Active Problems:   Diabetes type 2, controlled   Elevated blood pressure  Hospital Course Patient is a 42 yo female who presented with exertional mid-sternal CP radiating to the left chest, arm, back, and neck. Relieved with nitro in ED.   1. Unstable angina: patient presented with midsternal/left sided chest pain with exertion, relieved with rest and improved with nitro. Non-specific T-wave changes on initial EKG, normal EKG day after admission. Patient was admitted to telemetry and started on a heparin drip, nitro paste, coreg. An A1c was 5.7 and TSH was 2.386. Negative cardiac enzymes x3. Cardiology was consulted and recommended outpatient follow-up for lexiscan myoview in their office. Echo per Dr. Eden Emms revealed EF 60% and no abnormalities. Chest pain was resolved at time of discharge and patient was walked without recurrence of chest pain. Additionally she was started on a PPI given possible GI component of CP.   2. Elevated BP: to 140's/70's during admission. Started on coreg and BP within normal range at time of discharge.    3. DM: A1c of 5.7 improved from previously. Started on SSI while in the hospital. Held home glucotrol.  Problem List 1. Unstable angina 2. Elevated blood pressure 3. DM         Discharge PE   Filed Vitals:   09/07/12 0500  BP: 111/63  Pulse: 74  Temp: 98.3 F (36.8 C)  Resp: 18   General: no acute distress, sitting comfortably in bed  Cardiovascular: rrr, no mrg  Chest: minimal tenderness to palpation over left chest  Respiratory: CTAB, no wheezes or crackles  Abdomen: soft, NT, ND  Extremities: no edema    Procedures/Imaging:   Dg Chest 2 View  09/06/2012 No active cardiopulmonary disease.       Labs  CBC  Recent Labs Lab 09/06/12 1614 09/06/12 1639 09/07/12 0217  WBC 5.3  --  5.5  HGB 13.3 13.3 11.9*  HCT 36.9 39.0 33.7*  PLT 271  --  248   BMET  Recent Labs Lab 09/06/12 1639 09/07/12 0217  NA 138 139  K 3.7 3.7  CL 103 108  CO2  --  23  BUN 17 15  CREATININE 1.10 0.82  CALCIUM  --  8.3*  GLUCOSE 204* 148*   Results for orders placed during the hospital encounter of 09/06/12 (from the past 72 hour(s))  CBC WITH DIFFERENTIAL     Status: Abnormal   Collection Time    09/06/12  4:14 PM      Result Value Range   WBC 5.3  4.0 - 10.5 K/uL   RBC 4.59  3.87 - 5.11 MIL/uL   Hemoglobin 13.3  12.0 - 15.0 g/dL   HCT 65.7  84.6 - 96.2 %   MCV 80.4  78.0 - 100.0 fL   MCH 29.0  26.0 - 34.0 pg   MCHC 36.0  30.0 - 36.0 g/dL   RDW 95.2  84.1 - 32.4 %   Platelets 271  150 - 400 K/uL   Neutrophils Relative 47  43 - 77 %   Neutro Abs 2.5  1.7 - 7.7 K/uL   Lymphocytes Relative 41  12 - 46 %   Lymphs Abs 2.2  0.7 -  4.0 K/uL   Monocytes Relative 5  3 - 12 %   Monocytes Absolute 0.2  0.1 - 1.0 K/uL   Eosinophils Relative 7 (*) 0 - 5 %   Eosinophils Absolute 0.4  0.0 - 0.7 K/uL   Basophils Relative 0  0 - 1 %   Basophils Absolute 0.0  0.0 - 0.1 K/uL  POCT I-STAT TROPONIN I     Status: None   Collection Time    09/06/12  4:38 PM      Result Value Range   Troponin i, poc 0.00  0.00 - 0.08 ng/mL   Comment 3            Comment: Due to the release kinetics of cTnI,     a negative result within the first hours     of the onset of symptoms does not rule out     myocardial infarction with certainty.     If myocardial infarction is still suspected,     repeat the test at appropriate intervals.  POCT I-STAT, CHEM 8     Status: Abnormal   Collection Time    09/06/12  4:39 PM      Result Value Range   Sodium 138  135 - 145 mEq/L   Potassium 3.7  3.5 - 5.1 mEq/L   Chloride 103  96 - 112 mEq/L   BUN 17   6 - 23 mg/dL   Creatinine, Ser 9.62  0.50 - 1.10 mg/dL   Glucose, Bld 952 (*) 70 - 99 mg/dL   Calcium, Ion 8.41  3.24 - 1.23 mmol/L   TCO2 27  0 - 100 mmol/L   Hemoglobin 13.3  12.0 - 15.0 g/dL   HCT 40.1  02.7 - 25.3 %  PRO B NATRIURETIC PEPTIDE     Status: None   Collection Time    09/06/12  4:49 PM      Result Value Range   Pro B Natriuretic peptide (BNP) <5.0  0 - 125 pg/mL  POCT PREGNANCY, URINE     Status: None   Collection Time    09/06/12  5:53 PM      Result Value Range   Preg Test, Ur NEGATIVE  NEGATIVE   Comment:            THE SENSITIVITY OF THIS     METHODOLOGY IS >24 mIU/mL  URINALYSIS, ROUTINE W REFLEX MICROSCOPIC     Status: Abnormal   Collection Time    09/06/12  6:05 PM      Result Value Range   Color, Urine YELLOW  YELLOW   APPearance CLOUDY (*) CLEAR   Specific Gravity, Urine 1.012  1.005 - 1.030   pH 5.0  5.0 - 8.0   Glucose, UA NEGATIVE  NEGATIVE mg/dL   Hgb urine dipstick NEGATIVE  NEGATIVE   Bilirubin Urine NEGATIVE  NEGATIVE   Ketones, ur NEGATIVE  NEGATIVE mg/dL   Protein, ur NEGATIVE  NEGATIVE mg/dL   Urobilinogen, UA 0.2  0.0 - 1.0 mg/dL   Nitrite NEGATIVE  NEGATIVE   Leukocytes, UA TRACE (*) NEGATIVE  URINE MICROSCOPIC-ADD ON     Status: Abnormal   Collection Time    09/06/12  6:05 PM      Result Value Range   Squamous Epithelial / LPF FEW (*) RARE   WBC, UA 0-2  <3 WBC/hpf  TROPONIN I     Status: None   Collection Time    09/06/12  8:22 PM  Result Value Range   Troponin I <0.30  <0.30 ng/mL   Comment:            Due to the release kinetics of cTnI,     a negative result within the first hours     of the onset of symptoms does not rule out     myocardial infarction with certainty.     If myocardial infarction is still suspected,     repeat the test at appropriate intervals.  GLUCOSE, CAPILLARY     Status: None   Collection Time    09/06/12  8:34 PM      Result Value Range   Glucose-Capillary 82  70 - 99 mg/dL  HEPARIN LEVEL  (UNFRACTIONATED)     Status: None   Collection Time    09/06/12 11:42 PM      Result Value Range   Heparin Unfractionated 0.40  0.30 - 0.70 IU/mL   Comment:            IF HEPARIN RESULTS ARE BELOW     EXPECTED VALUES, AND PATIENT     DOSAGE HAS BEEN CONFIRMED,     SUGGEST FOLLOW UP TESTING     OF ANTITHROMBIN III LEVELS.  TSH     Status: None   Collection Time    09/06/12 11:42 PM      Result Value Range   TSH 2.386  0.350 - 4.500 uIU/mL  CBC     Status: Abnormal   Collection Time    09/07/12  2:17 AM      Result Value Range   WBC 5.5  4.0 - 10.5 K/uL   RBC 4.21  3.87 - 5.11 MIL/uL   Hemoglobin 11.9 (*) 12.0 - 15.0 g/dL   HCT 16.1 (*) 09.6 - 04.5 %   MCV 80.0  78.0 - 100.0 fL   MCH 28.3  26.0 - 34.0 pg   MCHC 35.3  30.0 - 36.0 g/dL   RDW 40.9  81.1 - 91.4 %   Platelets 248  150 - 400 K/uL  TROPONIN I     Status: None   Collection Time    09/07/12  2:17 AM      Result Value Range   Troponin I <0.30  <0.30 ng/mL   Comment:            Due to the release kinetics of cTnI,     a negative result within the first hours     of the onset of symptoms does not rule out     myocardial infarction with certainty.     If myocardial infarction is still suspected,     repeat the test at appropriate intervals.  BASIC METABOLIC PANEL     Status: Abnormal   Collection Time    09/07/12  2:17 AM      Result Value Range   Sodium 139  135 - 145 mEq/L   Potassium 3.7  3.5 - 5.1 mEq/L   Chloride 108  96 - 112 mEq/L   CO2 23  19 - 32 mEq/L   Glucose, Bld 148 (*) 70 - 99 mg/dL   BUN 15  6 - 23 mg/dL   Creatinine, Ser 7.82  0.50 - 1.10 mg/dL   Calcium 8.3 (*) 8.4 - 10.5 mg/dL   GFR calc non Af Amer 86 (*) >90 mL/min   GFR calc Af Amer >90  >90 mL/min   Comment:  The eGFR has been calculated     using the CKD EPI equation.     This calculation has not been     validated in all clinical     situations.     eGFR's persistently     <90 mL/min signify     possible Chronic  Kidney Disease.  LIPID PANEL     Status: None   Collection Time    09/07/12  2:17 AM      Result Value Range   Cholesterol 128  0 - 200 mg/dL   Triglycerides 84  <161 mg/dL   HDL 56  >09 mg/dL   Total CHOL/HDL Ratio 2.3     VLDL 17  0 - 40 mg/dL   LDL Cholesterol 55  0 - 99 mg/dL   Comment:            Total Cholesterol/HDL:CHD Risk     Coronary Heart Disease Risk Table                         Men   Women      1/2 Average Risk   3.4   3.3      Average Risk       5.0   4.4      2 X Average Risk   9.6   7.1      3 X Average Risk  23.4   11.0                Use the calculated Patient Ratio     above and the CHD Risk Table     to determine the patient's CHD Risk.                ATP III CLASSIFICATION (LDL):      <100     mg/dL   Optimal      604-540  mg/dL   Near or Above                        Optimal      130-159  mg/dL   Borderline      981-191  mg/dL   High      >478     mg/dL   Very High  GLUCOSE, CAPILLARY     Status: Abnormal   Collection Time    09/07/12  7:41 AM      Result Value Range   Glucose-Capillary 125 (*) 70 - 99 mg/dL   Comment 1 Notify RN    TROPONIN I     Status: None   Collection Time    09/07/12  8:23 AM      Result Value Range   Troponin I <0.30  <0.30 ng/mL   Comment:            Due to the release kinetics of cTnI,     a negative result within the first hours     of the onset of symptoms does not rule out     myocardial infarction with certainty.     If myocardial infarction is still suspected,     repeat the test at appropriate intervals.  HEPARIN LEVEL (UNFRACTIONATED)     Status: None   Collection Time    09/07/12  8:23 AM      Result Value Range   Heparin Unfractionated 0.33  0.30 - 0.70 IU/mL   Comment:  IF HEPARIN RESULTS ARE BELOW     EXPECTED VALUES, AND PATIENT     DOSAGE HAS BEEN CONFIRMED,     SUGGEST FOLLOW UP TESTING     OF ANTITHROMBIN III LEVELS.  GLUCOSE, CAPILLARY     Status: Abnormal   Collection Time     09/07/12 11:37 AM      Result Value Range   Glucose-Capillary 123 (*) 70 - 99 mg/dL   Comment 1 Notify RN         Patient condition at time of discharge/disposition: stable  Disposition-home   Follow up issues: 1. Scheduled for outpatient lexiscan myoview, f/u results 2. F/u elevated BP-appears to have responded to coreg 3. Dysfunctional uterine bleeding-patient previously on megace, patient discontinued this some time ago and may need alternative treatment for this issue  Discharge follow up:  Follow-up Information   Follow up with Charlton Haws, MD On 09/09/2012. (at 11:45 am, for lexiscan myoview)    Contact information:   [address] [place] [address] [phone]       Follow up with Majel Homer, MD On 09/10/2012. (3:15 pm)    Contact information:   [address] [place] [address] [phone]      Discharge Orders   Future Appointments Provider Department Dept Phone   09/09/2012 12:00 PM [facility] 2 Irven Shelling) [facility] SITE 3 NUCLEAR MED [phone]   09/10/2012 3:15 PM Brent Bulla, MD [facility] [phone]   Future Orders Complete By Expires     Call MD for:  difficulty breathing, headache or visual disturbances  As directed     Call MD for:  persistant nausea and vomiting  As directed     Call MD for:  redness, tenderness, or signs of infection (pain, swelling, redness, odor or green/yellow discharge around incision site)  As directed     Call MD for:  severe uncontrolled pain  As directed     Diet - low sodium heart healthy  As directed     Increase activity slowly  As directed         Discharge Instructions: Please refer to Patient Instructions section of EMR for full details.  Patient was counseled important signs and symptoms that should prompt return to medical care, changes in medications, dietary instructions, activity restrictions, and follow up  appointments.     Discharge Medications   Medication List    STOP taking these medications       megestrol 40 MG tablet  Commonly known as:  MEGACE      TAKE these medications       aspirin 81 MG EC tablet  Take 1 tablet (81 mg total) by mouth daily.     atorvastatin 20 MG tablet  Commonly known as:  LIPITOR  Take 1 tablet (20 mg total) by mouth daily at 6 PM.     carvedilol 3.125 MG tablet  Commonly known as:  COREG  Take 1 tablet (3.125 mg total) by mouth 2 (two) times daily with a meal.     EPINEPHrine 0.3 mg/0.3 mL Devi  Commonly known as:  EPIPEN  Inject 0.3 mLs (0.3 mg total) into the muscle as needed.     glipiZIDE 5 MG tablet  Commonly known as:  GLUCOTROL  Take 1 tablet (5 mg total) by mouth 2 (two) times daily with a meal.     ibuprofen 800 MG tablet  Commonly known  as:  ADVIL,MOTRIN  Take 1 tablet (800 mg total) by mouth every 8 (eight) hours as needed for pain.     multivitamin with minerals Tabs  Take 1 tablet by mouth daily.     nitroGLYCERIN 0.3 MG SL tablet  Commonly known as:  NITROSTAT  Place 1 tablet (0.3 mg total) under the tongue every 5 (five) minutes as needed for chest pain.     omeprazole 20 MG capsule  Commonly known as:  PRILOSEC  Take 1 capsule (20 mg total) by mouth 2 (two) times daily.       Marikay Alar, MD of [facility] 09/08/2012 7:20 PM

## 2012-09-07 NOTE — Progress Notes (Signed)
Patient c/o itching and hives after eating a [place] sandwich and applesauce.  Hives noted on patient's arms and neck.  Lips slightly swollen.  Patient reports that she has no known food allergies except shellfish and that it has been several years since she has eaten applesauce.  She has, however, eaten apples recently without difficulty.  Dr. Berline Chough notified of above.  States he will come to assess the patient.  Order received for po benadryl.  Will continue to monitor.

## 2012-09-07 NOTE — Progress Notes (Signed)
Reviewed discharge instructions with patient and she stated her understanding.  Patient following up on Wednesday with [facility] for stress test, address and phone number given to patient.  Patient discharged via wheelchair by volunteers.  Colman Cater

## 2012-09-07 NOTE — Progress Notes (Signed)
  Echocardiogram 2D Echocardiogram has been performed.  Cathy Bond 09/07/2012, 9:56 AM

## 2012-09-07 NOTE — Progress Notes (Signed)
ANTICOAGULATION CONSULT NOTE - Follow Up Consult  Pharmacy Consult for heparin Indication: chest pain/ACS   Labs:  Recent Labs  09/06/12 1614 09/06/12 1639 09/06/12 2022 09/06/12 2342  HGB 13.3 13.3  --   --   HCT 36.9 39.0  --   --   PLT 271  --   --   --   HEPARINUNFRC  --   --   --  0.40  CREATININE  --  1.10  --   --   TROPONINI  --   --  <0.30  --     Assessment/Plan:  42yo female therapeutic on heparin with initial dosing for CP.  Will continue gtt at current rate and confirm stable with additional level.  Colleen Can PharmD BCPS 09/07/2012,12:42 AM

## 2012-09-08 DIAGNOSIS — I10 Essential (primary) hypertension: Secondary | ICD-10-CM | POA: Diagnosis present

## 2012-09-09 ENCOUNTER — Ambulatory Visit (HOSPITAL_COMMUNITY): Payer: Medicare Other | Attending: Cardiology | Admitting: Radiology

## 2012-09-09 VITALS — BP 122/84 | HR 76 | Ht 64.0 in | Wt 151.0 lb

## 2012-09-09 DIAGNOSIS — R079 Chest pain, unspecified: Secondary | ICD-10-CM

## 2012-09-09 DIAGNOSIS — E119 Type 2 diabetes mellitus without complications: Secondary | ICD-10-CM | POA: Insufficient documentation

## 2012-09-09 DIAGNOSIS — R42 Dizziness and giddiness: Secondary | ICD-10-CM | POA: Insufficient documentation

## 2012-09-09 DIAGNOSIS — R61 Generalized hyperhidrosis: Secondary | ICD-10-CM | POA: Insufficient documentation

## 2012-09-09 DIAGNOSIS — R9431 Abnormal electrocardiogram [ECG] [EKG]: Secondary | ICD-10-CM

## 2012-09-09 DIAGNOSIS — E785 Hyperlipidemia, unspecified: Secondary | ICD-10-CM | POA: Diagnosis not present

## 2012-09-09 DIAGNOSIS — R0789 Other chest pain: Secondary | ICD-10-CM | POA: Diagnosis not present

## 2012-09-09 DIAGNOSIS — R110 Nausea: Secondary | ICD-10-CM | POA: Diagnosis not present

## 2012-09-09 DIAGNOSIS — R5381 Other malaise: Secondary | ICD-10-CM | POA: Diagnosis not present

## 2012-09-09 DIAGNOSIS — I10 Essential (primary) hypertension: Secondary | ICD-10-CM | POA: Insufficient documentation

## 2012-09-09 DIAGNOSIS — R000 Tachycardia, unspecified: Secondary | ICD-10-CM | POA: Insufficient documentation

## 2012-09-09 DIAGNOSIS — R5383 Other fatigue: Secondary | ICD-10-CM | POA: Insufficient documentation

## 2012-09-09 MED ORDER — TECHNETIUM TC 99M SESTAMIBI GENERIC - CARDIOLITE
11.0000 | Freq: Once | INTRAVENOUS | Status: AC | PRN
  Administered 2012-09-09: 11 via INTRAVENOUS

## 2012-09-09 MED ORDER — AMINOPHYLLINE 25 MG/ML IV SOLN
75.0000 mg | Freq: Once | INTRAVENOUS | Status: AC
  Administered 2012-09-09: 75 mg via INTRAVENOUS

## 2012-09-09 MED ORDER — REGADENOSON 0.4 MG/5ML IV SOLN
0.4000 mg | Freq: Once | INTRAVENOUS | Status: AC
  Administered 2012-09-09: 0.4 mg via INTRAVENOUS

## 2012-09-09 MED ORDER — TECHNETIUM TC 99M SESTAMIBI GENERIC - CARDIOLITE
33.0000 | Freq: Once | INTRAVENOUS | Status: AC | PRN
  Administered 2012-09-09: 33 via INTRAVENOUS

## 2012-09-09 NOTE — Progress Notes (Signed)
[facility] SITE 3 NUCLEAR MED [address] [place], [place] [address] [phone]    Cardiology Nuclear Med Study  Cathy Bond is a 42 y.o. female     MRN : [id number]     DOB: 1971/03/26  Procedure Date: 09/09/2012  Nuclear Med Background Indication for Stress Test:  Evaluation for Ischemia and Patient seen in ED on 09/06/12 for Chest Pain and Abnormal EKG with  Negative Enzymes History:  No previously documented CAD Cardiac Risk Factors: History of Smoking, Hypertension, Lipids and NIDDM  Symptoms:  Chest Pain with and without Exertion (last episode of chest discomfort is now, 2-3/10), Diaphoresis, Fatigue, Nausea and Rapid HR   Nuclear Pre-Procedure Caffeine/Decaff Intake:  None > 12 hrs NPO After: 8:00am   Lungs:  Clear. O2 Sat: 97% on room air. IV 0.9% NS with Angio Cath:  22g  IV Site: R Antecubital x 1, tolerated well IV Started by:  Irean Hong, RN  Chest Size (in):  36 Cup Size: C  Height: 5\' 4"  (1.626 m)  Weight:  151 lb (68.493 kg)  BMI:  Body mass index is 25.91 kg/(m^2). Tech Comments:  Coreg held x 24 hours; took Glipizide this am with breakfast    Nuclear Med Study 1 or 2 day study: 1 day  Stress Test Type:  Lexiscan  Reading MD: Olga Millers, MD  Order Authorizing Provider:  Charlton Haws, MD  Resting Radionuclide: Technetium 39m Sestamibi  Resting Radionuclide Dose: 11.0 mCi   Stress Radionuclide:  Technetium 44m Sestamibi  Stress Radionuclide Dose: 33.0 mCi           Stress Protocol Rest HR: 76 Stress HR: 134  Rest BP: 122/84 Stress BP: 113/80  Exercise Time (min): n/a METS: n/a   Predicted Max HR: 177 bpm % Max HR: 75.71 bpm Rate Pressure Product: 46962   Dose of Adenosine (mg):  n/a Dose of Lexiscan: 0.4 mg Dose of Aminophylline:75 mg  Dose of Atropine (mg): n/a Dose of Dobutamine: n/a mcg/kg/min (at max HR)  Stress Test Technologist: Smiley Houseman, CMA-N  Nuclear Technologist:  Domenic Polite, CNMT     Rest Procedure:   Myocardial perfusion imaging was performed at rest 45 minutes following the intravenous administration of Technetium 30m Sestamibi.  Rest ECG: NSR, RAD  Stress Procedure:  The patient received IV Lexiscan 0.4 mg over 15-seconds.  Technetium 104m Sestamibi injected at 30-seconds.  Patient c/o nausea and head "woozy", which was relieved with Aminophylline 75 mg IV. Quantitative spect images were obtained after a 45 minute delay.  Stress ECG: No significant ST segment change suggestive of ischemia.  QPS Raw Data Images:  Acquisition technically good; normal left ventricular size; possible nodules right breast. Stress Images:  Normal homogeneous uptake in all areas of the myocardium. Rest Images:  Normal homogeneous uptake in all areas of the myocardium. Subtraction (SDS):  No evidence of ischemia. Transient Ischemic Dilatation (Normal <1.22):  1.14 Lung/Heart Ratio (Normal <0.45):  0.35  Quantitative Gated Spect Images QGS EDV:  87 ml QGS ESV:  32 ml  Impression Exercise Capacity:  Lexiscan with no exercise. BP Response:  Normal blood pressure response. Clinical Symptoms:  There is dyspnea. ECG Impression:  No significant ST segment change suggestive of ischemia. Comparison with Prior Nuclear Study: No previous nuclear study performed  Overall Impression:  Normal stress nuclear study; note 2 small areas of uptake right breast; possible nodules; suggest mammogram and clinical correlation.  LV Ejection Fraction: 63%.  LV Wall Motion:  NL LV Function; NL Wall Motion  Olga Millers

## 2012-09-10 ENCOUNTER — Ambulatory Visit (INDEPENDENT_AMBULATORY_CARE_PROVIDER_SITE_OTHER): Payer: Medicare Other | Admitting: Family Medicine

## 2012-09-10 ENCOUNTER — Encounter: Payer: Self-pay | Admitting: Family Medicine

## 2012-09-10 VITALS — BP 120/91 | HR 93 | Temp 99.8°F | Ht 64.0 in | Wt 158.0 lb

## 2012-09-10 DIAGNOSIS — N6310 Unspecified lump in the right breast, unspecified quadrant: Secondary | ICD-10-CM | POA: Insufficient documentation

## 2012-09-10 DIAGNOSIS — N63 Unspecified lump in unspecified breast: Secondary | ICD-10-CM

## 2012-09-10 DIAGNOSIS — R0789 Other chest pain: Secondary | ICD-10-CM

## 2012-09-10 DIAGNOSIS — R072 Precordial pain: Secondary | ICD-10-CM

## 2012-09-10 MED ORDER — SUCRALFATE 1 G PO TABS: 1.0000 g | ORAL_TABLET | Freq: Two times a day (BID) | ORAL | Status: DC

## 2012-09-10 NOTE — Assessment & Plan Note (Signed)
Plan mammo +/- MRI depending on findings.

## 2012-09-10 NOTE — Progress Notes (Signed)
Patient ID: ALEXXUS SOBH, female   DOB: [birth year], 42 y.o.   MRN: [id number] Subjective: The patient is a 42 y.o. year old female who presents today for followup.  1. Chest pain: Patient was seen and evaluated in the hospital for her substernal chest pain. She had nuclear stress test performed which was normal. There is no sign of cardiac ischemia. Accordingly, there is no significant followup that is required for cardiac standpoint. She does continue to have some problems with intermittent burning to sharp chest pain. She is taking Prilosec twice a day. She wonders if her pain could be related to gas. She is also wondering if she needs to continue taking her carvedilol. Since starting it she reports some problems with blurred vision. These are intermittent.  2. Breast findings: On the patient's nuclear stress test to high uptake nodules were identified in the right breast. This was considered to be abnormal and mammogram was advised. The patient is not noticed any changes in her breast. She does not regularly practice self breast exam. She has not noticed any changes in weight, skin changes, or nipple discharge.  Patient's past medical, social, and family history were reviewed and updated as appropriate. History  Substance Use Topics  . Smoking status: Former Smoker -- 2.00 packs/day for 6 years    Types: Cigarettes  . Smokeless tobacco: Never Used     Comment: used patches before  . Alcohol Use: No   Objective:  Filed Vitals:   09/10/12 1518  BP: 120/91  Pulse: 93  Temp: 99.8 F (37.7 C)   Gen: No acute distress, appropriate weight CV: Regular rate and rhythm Resp: Clear to auscultation bilaterally Breast exam: Fibrocystic changes bilaterally without dominant masses. There are no overlying skin changes, erythema, or retraction. There is no nipple discharge.  Assessment/Plan:  Please also see individual problems in problem list for problem-specific plans.

## 2012-09-10 NOTE — Assessment & Plan Note (Signed)
Does not appear to be related to cardiac etiology.  Will treat for reflux and observe.  Add karafate.

## 2012-09-11 NOTE — Discharge Summary (Signed)
Family Medicine Teaching Service  Discharge Note : Attending Jeff Walden MD Pager [phone] Inpatient Team Pager:  [phone]  I have seen and examined this patient, reviewed their chart and discussed discharge planning with the resident at the time of discharge. I agree with the discharge plan as above.  

## 2012-09-21 ENCOUNTER — Other Ambulatory Visit: Payer: Medicare Other

## 2012-09-22 ENCOUNTER — Other Ambulatory Visit: Payer: Self-pay | Admitting: Family Medicine

## 2012-09-22 MED ORDER — GLUCOSE BLOOD VI STRP: ORAL_STRIP | Status: DC

## 2012-10-01 ENCOUNTER — Ambulatory Visit
Admission: RE | Admit: 2012-10-01 | Discharge: 2012-10-01 | Disposition: A | Discharge status: Home / Self Care | Payer: Medicare Other | Source: Ambulatory Visit | Attending: Family Medicine | Admitting: Family Medicine

## 2012-10-01 ENCOUNTER — Telehealth: Payer: Self-pay | Admitting: Family Medicine

## 2012-10-01 DIAGNOSIS — N6310 Unspecified lump in the right breast, unspecified quadrant: Secondary | ICD-10-CM

## 2012-10-01 DIAGNOSIS — R922 Inconclusive mammogram: Secondary | ICD-10-CM | POA: Diagnosis not present

## 2012-10-01 NOTE — Telephone Encounter (Signed)
Pt states ultra 2. Cathy Bond, Cathy Bond

## 2012-10-01 NOTE — Telephone Encounter (Signed)
Pt states that she need diabetic strips rx sent to the pharmacy.

## 2012-10-01 NOTE — Telephone Encounter (Signed)
What type of meter does she have?

## 2012-10-05 ENCOUNTER — Ambulatory Visit: Payer: Medicare Other | Admitting: Family Medicine

## 2012-10-05 MED ORDER — GLUCOSE BLOOD VI STRP: ORAL_STRIP | Status: DC

## 2012-10-13 ENCOUNTER — Other Ambulatory Visit: Payer: Self-pay | Admitting: Family Medicine

## 2012-10-13 ENCOUNTER — Encounter: Payer: Self-pay | Admitting: Family Medicine

## 2012-10-13 ENCOUNTER — Ambulatory Visit: Payer: Medicare Other | Admitting: Family Medicine

## 2012-10-13 VITALS — BP 132/92 | HR 77 | Temp 98.2°F | Ht 64.0 in | Wt 161.4 lb

## 2012-10-13 DIAGNOSIS — E119 Type 2 diabetes mellitus without complications: Secondary | ICD-10-CM

## 2012-10-13 DIAGNOSIS — R209 Unspecified disturbances of skin sensation: Secondary | ICD-10-CM

## 2012-10-13 DIAGNOSIS — R5383 Other fatigue: Secondary | ICD-10-CM

## 2012-10-13 DIAGNOSIS — Z79899 Other long term (current) drug therapy: Secondary | ICD-10-CM

## 2012-10-13 DIAGNOSIS — N6310 Unspecified lump in the right breast, unspecified quadrant: Secondary | ICD-10-CM

## 2012-10-13 NOTE — Assessment & Plan Note (Signed)
MRI re-ordered

## 2012-10-13 NOTE — Assessment & Plan Note (Signed)
Given multiple medications, problems with reflux, and fatigue I think a one time check of B12 is reasonable.

## 2012-10-13 NOTE — Progress Notes (Signed)
Patient ID: Cathy Bond, female   DOB: 1971/04/09, 42 y.o.   MRN: [id number] Subjective: The patient is a 42 y.o. year old female who presents today for f/u.  Needs breast MRI performed.  This order was placed in computer but is now listed as canceled.  No new complaints related to breast.  Patient does continue to have problems with fatigue and wondered if she might have problems with her B12 level.  Patient's past medical, social, and family history were reviewed and updated as appropriate. History  Substance Use Topics  . Smoking status: Former Smoker -- 2.00 packs/day for 6 years    Types: Cigarettes  . Smokeless tobacco: Never Used     Comment: used patches before  . Alcohol Use: No   Objective:  Filed Vitals:   10/13/12 1120  BP: 132/92  Pulse: 77  Temp: 98.2 F (36.8 C)    Assessment/Plan:  Please also see individual problems in problem list for problem-specific plans.

## 2012-10-14 ENCOUNTER — Encounter: Payer: Self-pay | Admitting: Family Medicine

## 2012-10-14 LAB — BASIC METABOLIC PANEL
BUN: 18 mg/dL (ref 6–23)
CO2: 29 mEq/L (ref 19–32)
Chloride: 103 mEq/L (ref 96–112)
Creat: 0.85 mg/dL (ref 0.50–1.10)
Glucose, Bld: 62 mg/dL — ABNORMAL LOW (ref 70–99)
Potassium: 4.2 mEq/L (ref 3.5–5.3)

## 2012-10-14 LAB — VITAMIN B12: Vitamin B-12: 604 pg/mL (ref 211–911)

## 2012-10-24 ENCOUNTER — Other Ambulatory Visit: Payer: Medicare Other

## 2012-11-16 ENCOUNTER — Other Ambulatory Visit (HOSPITAL_COMMUNITY)
Admission: RE | Admit: 2012-11-16 | Discharge: 2012-11-16 | Disposition: A | Discharge status: Home / Self Care | Payer: Medicare Other | Source: Ambulatory Visit | Attending: Emergency Medicine | Admitting: Emergency Medicine

## 2012-11-16 ENCOUNTER — Emergency Department (INDEPENDENT_AMBULATORY_CARE_PROVIDER_SITE_OTHER)
Admission: EM | Admit: 2012-11-16 | Discharge: 2012-11-16 | Disposition: A | Discharge status: Home / Self Care | Payer: Medicare Other | Source: Home / Self Care | Attending: Emergency Medicine | Admitting: Emergency Medicine

## 2012-11-16 ENCOUNTER — Encounter (HOSPITAL_COMMUNITY): Payer: Self-pay | Admitting: Emergency Medicine

## 2012-11-16 DIAGNOSIS — Z113 Encounter for screening for infections with a predominantly sexual mode of transmission: Secondary | ICD-10-CM | POA: Diagnosis not present

## 2012-11-16 DIAGNOSIS — N949 Unspecified condition associated with female genital organs and menstrual cycle: Secondary | ICD-10-CM | POA: Diagnosis not present

## 2012-11-16 DIAGNOSIS — N760 Acute vaginitis: Secondary | ICD-10-CM

## 2012-11-16 DIAGNOSIS — R102 Pelvic and perineal pain: Secondary | ICD-10-CM

## 2012-11-16 LAB — POCT URINALYSIS DIP (DEVICE)
Bilirubin Urine: NEGATIVE
Hgb urine dipstick: NEGATIVE
Ketones, ur: NEGATIVE mg/dL
Protein, ur: NEGATIVE mg/dL
pH: 5.5 (ref 5.0–8.0)

## 2012-11-16 MED ORDER — METRONIDAZOLE 500 MG PO TABS: 500.0000 mg | ORAL_TABLET | Freq: Two times a day (BID) | ORAL | Status: DC

## 2012-11-16 NOTE — ED Provider Notes (Addendum)
History     CSN: [id number]  Arrival date & time 11/16/12  1319   First MD Initiated Contact with Patient 11/16/12 1504      Chief Complaint  Patient presents with  . Pelvic Pain    (Consider location/radiation/quality/duration/timing/severity/associated sxs/prior treatment) Patient is a 42 y.o. female presenting with pelvic pain. The history is provided by the patient. No language interpreter was used.  Pelvic Pain This is a recurrent problem. Episode onset: 2 WEEKS. The problem occurs constantly. The problem has not changed since onset.Associated symptoms comments: NONE. Nothing aggravates the symptoms. Nothing relieves the symptoms. She has tried nothing for the symptoms.   SAYS FELT LIKE BACTERIAL VAGINOSIS THAT SHE HAD BEFORE Past Medical History  Diagnosis Date  . Seasonal allergies   . Depression   . Anxiety   . BV (bacterial vaginosis)   . Trichomonas   . Yeast vaginitis   . Diabetes mellitus     Past Surgical History  Procedure Laterality Date  . Tubal ligation    . Toe nail    . Endometrial biopsy  2 in 2013    negative    Family History  Problem Relation Age of Onset  . Diabetes Mother   . Stroke Mother   . Depression Sister   . Alcohol abuse Sister   . Diabetes Sister   . Depression Brother   . Diabetes Brother     History  Substance Use Topics  . Smoking status: Former Smoker -- 2.00 packs/day for 6 years    Types: Cigarettes  . Smokeless tobacco: Never Used     Comment: used patches before  . Alcohol Use: No    OB History   Grav Para Term Preterm Abortions TAB SAB Ect Mult Living                  Review of Systems  Constitutional: Negative.   Genitourinary: Positive for vaginal discharge and pelvic pain.  All other systems reviewed and are negative.    Allergies  Apple; Shellfish allergy; and Wellbutrin  Home Medications   Current Outpatient Rx  Name  Route  Sig  Dispense  Refill  . aspirin EC 81 MG EC tablet   Oral   Take  1 tablet (81 mg total) by mouth daily.   90 tablet   3   . glipiZIDE (GLUCOTROL) 5 MG tablet   Oral   Take 1 tablet (5 mg total) by mouth 2 (two) times daily with a meal.   60 tablet   3   . atorvastatin (LIPITOR) 20 MG tablet   Oral   Take 1 tablet (20 mg total) by mouth daily at 6 PM.   30 tablet   1   . carvedilol (COREG) 3.125 MG tablet   Oral   Take 1 tablet (3.125 mg total) by mouth 2 (two) times daily with a meal.   60 tablet   1   . EPINEPHrine (EPIPEN) 0.3 mg/0.3 mL DEVI   Intramuscular   Inject 0.3 mLs (0.3 mg total) into the muscle as needed.   1 Device   1   . glucose blood (ONE TOUCH ULTRA TEST) test strip      Use as instructed   100 each   12   . ibuprofen (ADVIL,MOTRIN) 800 MG tablet   Oral   Take 1 tablet (800 mg total) by mouth every 8 (eight) hours as needed for pain.   30 tablet   0   .  metroNIDAZOLE (FLAGYL) 500 MG tablet   Oral   Take 1 tablet (500 mg total) by mouth 2 (two) times daily.   14 tablet   0   . Multiple Vitamin (MULITIVITAMIN WITH MINERALS) TABS   Oral   Take 1 tablet by mouth daily.         . nitroGLYCERIN (NITROSTAT) 0.3 MG SL tablet   Sublingual   Place 1 tablet (0.3 mg total) under the tongue every 5 (five) minutes as needed for chest pain.   10 tablet   0   . omeprazole (PRILOSEC) 20 MG capsule   Oral   Take 1 capsule (20 mg total) by mouth 2 (two) times daily.   60 capsule   1   . sucralfate (CARAFATE) 1 G tablet   Oral   Take 1 tablet (1 g total) by mouth 2 (two) times daily.   60 tablet   1     BP 146/87  Pulse 68  Temp(Src) 98.4 F (36.9 C) (Oral)  Resp 16  SpO2 100%  LMP 10/29/2012  Physical Exam  Constitutional: She is oriented to person, place, and time. She appears well-developed and well-nourished.  HENT:  Head: Normocephalic and atraumatic.  Eyes: Pupils are equal, round, and reactive to light.  Neck: Normal range of motion. Neck supple.  Cardiovascular: Normal rate and regular  rhythm.   Pulmonary/Chest: Effort normal and breath sounds normal.  Abdominal: Soft. There is tenderness.  MILD TENDERNESS SUPRAPUBIC AREA NO REBOUND  Musculoskeletal: Normal range of motion.  Neurological: She is alert and oriented to person, place, and time.  Skin: Skin is warm and dry.    ED Course  Procedures (including critical care time)  Labs Reviewed  POCT URINALYSIS DIP (DEVICE)  POCT PREGNANCY, URINE  CERVICOVAGINAL ANCILLARY ONLY  CERVICOVAGINAL ANCILLARY ONLY   No results found.   1. Vaginitis   2. Pelvic pain in female       MDM          Jani Files, MD 11/19/12 1955  Duwayne Heck [place], MD 11/19/12 [phone]

## 2012-11-16 NOTE — ED Notes (Signed)
Pt c/o intermittent pelvic pain onset 2 weeks Sx include mild white discharge,  Denies: dysuria, hematuria, f/v/n/d Hx of BV  She is alert and oriented w/no signs of acute distress.

## 2012-11-18 NOTE — ED Notes (Signed)
GC/Chlamydia neg., Affirm: Gardnerella pos., Candida and Trich neg.  Pt. adequately treated with Flagyl. Vassie Moselle 11/18/2012

## 2012-12-28 ENCOUNTER — Other Ambulatory Visit: Payer: Self-pay | Admitting: Family Medicine

## 2012-12-28 NOTE — Telephone Encounter (Signed)
Patient is calling because the pharmacy said that a new Rx was need for Glipizide, all refills have been used.  [facility] [address]

## 2012-12-29 ENCOUNTER — Other Ambulatory Visit: Payer: Self-pay | Admitting: *Deleted

## 2012-12-29 MED ORDER — GLIPIZIDE 5 MG PO TABS: 5.0000 mg | ORAL_TABLET | Freq: Two times a day (BID) | ORAL | Status: DC

## 2012-12-29 NOTE — Telephone Encounter (Signed)
Patient wants to let Dr. Louanne Belton know that she is completely out of her Glipizide.

## 2012-12-29 NOTE — Telephone Encounter (Signed)
Requested Prescriptions   Pending Prescriptions Disp Refills  . glipiZIDE (GLUCOTROL) 5 MG tablet 60 tablet 3    Sig: Take 1 tablet (5 mg total) by mouth 2 (two) times daily with a meal.   Wyatt Haste, RN-BSN

## 2012-12-29 NOTE — Telephone Encounter (Signed)
Fwd to Md.  Cathy Bond, CMA  

## 2012-12-29 NOTE — Telephone Encounter (Signed)
Patient calls again about needing a new Rx for Glipizide. Is completely out.

## 2013-01-11 ENCOUNTER — Encounter: Payer: Self-pay | Admitting: Family Medicine

## 2013-01-11 ENCOUNTER — Ambulatory Visit (INDEPENDENT_AMBULATORY_CARE_PROVIDER_SITE_OTHER): Payer: Medicare Other | Admitting: Family Medicine

## 2013-01-11 VITALS — BP 140/81 | HR 99 | Temp 99.4°F | Ht 64.0 in | Wt 163.0 lb

## 2013-01-11 DIAGNOSIS — N898 Other specified noninflammatory disorders of vagina: Secondary | ICD-10-CM

## 2013-01-11 DIAGNOSIS — R109 Unspecified abdominal pain: Secondary | ICD-10-CM | POA: Diagnosis not present

## 2013-01-11 DIAGNOSIS — E119 Type 2 diabetes mellitus without complications: Secondary | ICD-10-CM

## 2013-01-11 DIAGNOSIS — R1030 Lower abdominal pain, unspecified: Secondary | ICD-10-CM | POA: Insufficient documentation

## 2013-01-11 DIAGNOSIS — R350 Frequency of micturition: Secondary | ICD-10-CM

## 2013-01-11 LAB — POCT UA - MICROSCOPIC ONLY

## 2013-01-11 LAB — POCT URINALYSIS DIPSTICK
Glucose, UA: NEGATIVE
Ketones, UA: NEGATIVE
Protein, UA: NEGATIVE
Spec Grav, UA: 1.01
Urobilinogen, UA: 0.2

## 2013-01-11 LAB — POCT WET PREP (WET MOUNT): Clue Cells Wet Prep Whiff POC: POSITIVE

## 2013-01-11 MED ORDER — METRONIDAZOLE 500 MG PO TABS: 500.0000 mg | ORAL_TABLET | Freq: Two times a day (BID) | ORAL | Status: DC

## 2013-01-11 MED ORDER — CEFTRIAXONE SODIUM 1 G IJ SOLR
250.0000 mg | Freq: Once | INTRAMUSCULAR | Status: AC
  Administered 2013-01-11: 250 mg via INTRAMUSCULAR

## 2013-01-11 MED ORDER — DOXYCYCLINE HYCLATE 50 MG PO CAPS: 50.0000 mg | ORAL_CAPSULE | Freq: Two times a day (BID) | ORAL | Status: DC

## 2013-01-11 NOTE — Assessment & Plan Note (Signed)
Recurrent lower abdominal pain in female with discharge. UA is negative for infection. Had normal cervical cultures 2 months ago, and normal menses with LMP 2 weeks ago making fibroid/endometriosis etiology much less likely. Will treat as mild case of PID given recurrence of symptoms and the slight response to flagyl previously. She does have evidence of BV so will repeat flagyl, add doxycycline and rocephin x 1. She will notify MD if symptoms worsen or fail to improve and f/u with PCP in 2 weeks.

## 2013-01-11 NOTE — Patient Instructions (Signed)
Take the flagyl and doxycycline. Do not drink with meds. You can use aleve or motrin as needed for pain. If you have fever, vomiting or worsened symptoms then return to clinic.

## 2013-01-11 NOTE — Progress Notes (Signed)
  Subjective:    Patient ID: Cathy Bond, female    DOB: [birth year], 42 y.o.   MRN: [id number]  Urinary Frequency  Associated symptoms include frequency.  Vaginal Discharge The patient's primary symptoms include a vaginal discharge. Associated symptoms include frequency.    42 yo female with history of menorrhagia and recent treatment for BV in April. She is having recurrence of symptoms she had in April including moderate pelvic cramping/pain, urinary frequency and small amount of vaginal discharge. These symptoms improved for a short time with flagyl treatment in April, but recurred 3 weeks ago now are daily. She complains of lower abdominal cramping which is worse than she had with evaluation of fibroid in the past. She states she did have sex while taking flagyl treatment and is concerned this is why symptoms came back. Her LMP was 12/28/12, and symptoms returned one week prior to LMP.   She did have nausea last week. She denies dysuria, fever, chills, abnormal/heavy bleeding, emesis, diarrhea, weight loss, back pain.   Review of Systems  Genitourinary: Positive for frequency and vaginal discharge.   See HPI otherwise negative.  reports that she has quit smoking. Her smoking use included Cigarettes. She has a 12 pack-year smoking history. She has never used smokeless tobacco.     Objective:   Physical Exam  Vitals reviewed. Constitutional: She is oriented to person, place, and time. She appears well-developed and well-nourished. No distress.  HENT:  Head: Normocephalic and atraumatic.  Mouth/Throat: Oropharynx is clear and moist.  Neck: Neck supple.  Pulmonary/Chest: Effort normal.  Abdominal: Soft. Bowel sounds are normal. She exhibits no distension. There is no tenderness. There is no rebound and no guarding.  No CVA tenderness.  Genitourinary:  White discharge. No cervical motion TTP. Uterus not enlarged. No masses palpated.  Musculoskeletal: She exhibits no edema.   Neurological: She is alert and oriented to person, place, and time.  Skin: She is not diaphoretic.  Psychiatric: She has a normal mood and affect.        Assessment & Plan:

## 2013-01-27 ENCOUNTER — Telehealth: Payer: Self-pay | Admitting: *Deleted

## 2013-01-27 MED ORDER — GLUCOSE BLOOD VI STRP: ORAL_STRIP | Status: DC

## 2013-03-02 ENCOUNTER — Emergency Department (HOSPITAL_COMMUNITY): Payer: Medicare Other

## 2013-03-02 ENCOUNTER — Emergency Department (HOSPITAL_COMMUNITY)
Admission: EM | Admit: 2013-03-02 | Discharge: 2013-03-02 | Disposition: A | Discharge status: Home / Self Care | Payer: Medicare Other | Attending: Emergency Medicine | Admitting: Emergency Medicine

## 2013-03-02 ENCOUNTER — Encounter (HOSPITAL_COMMUNITY): Payer: Self-pay | Admitting: Emergency Medicine

## 2013-03-02 DIAGNOSIS — F17200 Nicotine dependence, unspecified, uncomplicated: Secondary | ICD-10-CM | POA: Diagnosis not present

## 2013-03-02 DIAGNOSIS — K649 Unspecified hemorrhoids: Secondary | ICD-10-CM

## 2013-03-02 DIAGNOSIS — Z79899 Other long term (current) drug therapy: Secondary | ICD-10-CM | POA: Insufficient documentation

## 2013-03-02 DIAGNOSIS — E119 Type 2 diabetes mellitus without complications: Secondary | ICD-10-CM | POA: Insufficient documentation

## 2013-03-02 DIAGNOSIS — R1032 Left lower quadrant pain: Secondary | ICD-10-CM | POA: Diagnosis not present

## 2013-03-02 DIAGNOSIS — Z8619 Personal history of other infectious and parasitic diseases: Secondary | ICD-10-CM | POA: Diagnosis not present

## 2013-03-02 DIAGNOSIS — R109 Unspecified abdominal pain: Secondary | ICD-10-CM | POA: Diagnosis not present

## 2013-03-02 DIAGNOSIS — Z3202 Encounter for pregnancy test, result negative: Secondary | ICD-10-CM | POA: Diagnosis not present

## 2013-03-02 DIAGNOSIS — Z9851 Tubal ligation status: Secondary | ICD-10-CM | POA: Diagnosis not present

## 2013-03-02 DIAGNOSIS — K625 Hemorrhage of anus and rectum: Secondary | ICD-10-CM | POA: Diagnosis not present

## 2013-03-02 DIAGNOSIS — Z8742 Personal history of other diseases of the female genital tract: Secondary | ICD-10-CM | POA: Diagnosis not present

## 2013-03-02 DIAGNOSIS — K921 Melena: Secondary | ICD-10-CM | POA: Insufficient documentation

## 2013-03-02 DIAGNOSIS — Z8659 Personal history of other mental and behavioral disorders: Secondary | ICD-10-CM | POA: Insufficient documentation

## 2013-03-02 DIAGNOSIS — Z7982 Long term (current) use of aspirin: Secondary | ICD-10-CM | POA: Insufficient documentation

## 2013-03-02 LAB — OCCULT BLOOD, POC DEVICE: Fecal Occult Bld: NEGATIVE

## 2013-03-02 LAB — GLUCOSE, CAPILLARY: Glucose-Capillary: 103 mg/dL — ABNORMAL HIGH (ref 70–99)

## 2013-03-02 LAB — BASIC METABOLIC PANEL
BUN: 12 mg/dL (ref 6–23)
CO2: 27 mEq/L (ref 19–32)
Chloride: 100 mEq/L (ref 96–112)
GFR calc non Af Amer: 82 mL/min — ABNORMAL LOW (ref >90)
Glucose, Bld: 211 mg/dL — ABNORMAL HIGH (ref 70–99)
Potassium: 3.6 mEq/L (ref 3.5–5.1)
Sodium: 136 mEq/L (ref 135–145)

## 2013-03-02 LAB — URINALYSIS, ROUTINE W REFLEX MICROSCOPIC
Glucose, UA: NEGATIVE mg/dL
Hgb urine dipstick: NEGATIVE
Specific Gravity, Urine: 1.005 (ref 1.005–1.030)
Urobilinogen, UA: 0.2 mg/dL (ref 0.0–1.0)
pH: 6 (ref 5.0–8.0)

## 2013-03-02 LAB — PREGNANCY, URINE: Preg Test, Ur: NEGATIVE

## 2013-03-02 LAB — HEPATIC FUNCTION PANEL
AST: 17 U/L (ref 0–37)
Albumin: 3.7 g/dL (ref 3.5–5.2)
Alkaline Phosphatase: 53 U/L (ref 39–117)
Total Protein: 7.3 g/dL (ref 6.0–8.3)

## 2013-03-02 LAB — CBC WITH DIFFERENTIAL/PLATELET
Hemoglobin: 12.8 g/dL (ref 12.0–15.0)
Lymphocytes Relative: 40 % (ref 12–46)
Lymphs Abs: 2.5 10*3/uL (ref 0.7–4.0)
Monocytes Relative: 4 % (ref 3–12)
Neutro Abs: 3.1 10*3/uL (ref 1.7–7.7)
Neutrophils Relative %: 50 % (ref 43–77)
Platelets: 282 10*3/uL (ref 150–400)
RBC: 4.49 MIL/uL (ref 3.87–5.11)
WBC: 6.2 10*3/uL (ref 4.0–10.5)

## 2013-03-02 LAB — URINE MICROSCOPIC-ADD ON

## 2013-03-02 MED ORDER — IOHEXOL 300 MG/ML  SOLN
25.0000 mL | INTRAMUSCULAR | Status: AC
  Administered 2013-03-02: 25 mL via ORAL

## 2013-03-02 MED ORDER — DOCUSATE SODIUM 100 MG PO CAPS: 100.0000 mg | ORAL_CAPSULE | Freq: Two times a day (BID) | ORAL | Status: DC

## 2013-03-02 MED ORDER — ONDANSETRON HCL 4 MG/2ML IJ SOLN
4.0000 mg | Freq: Once | INTRAMUSCULAR | Status: AC
  Administered 2013-03-02: 4 mg via INTRAVENOUS
  Filled 2013-03-02: qty 2

## 2013-03-02 MED ORDER — SODIUM CHLORIDE 0.9 % IV BOLUS (SEPSIS)
1000.0000 mL | Freq: Once | INTRAVENOUS | Status: AC
  Administered 2013-03-02: 1000 mL via INTRAVENOUS

## 2013-03-02 MED ORDER — IOHEXOL 300 MG/ML  SOLN
100.0000 mL | Freq: Once | INTRAMUSCULAR | Status: AC | PRN
  Administered 2013-03-02: 100 mL via INTRAVENOUS

## 2013-03-02 MED ORDER — MORPHINE SULFATE 4 MG/ML IJ SOLN
4.0000 mg | Freq: Once | INTRAMUSCULAR | Status: AC
  Administered 2013-03-02: 4 mg via INTRAVENOUS
  Filled 2013-03-02: qty 1

## 2013-03-02 NOTE — ED Notes (Signed)
Onset 2 days ago left flank pain and rectal bleeding bright red during bowel movement.  States slight nausea denies emesis or diarrhea.

## 2013-03-02 NOTE — ED Notes (Signed)
CT called; pt completed contrast 

## 2013-03-02 NOTE — ED Provider Notes (Signed)
CSN: [id number]     Arrival date & time 03/02/13  1804 History     First MD Initiated Contact with Patient 03/02/13 1959     Chief Complaint  Patient presents with  . Rectal Bleeding  . Flank Pain   (Consider location/radiation/quality/duration/timing/severity/associated sxs/prior Treatment) Patient is a 42 y.o. female presenting with hematochezia and flank pain. The history is provided by the patient.  Rectal Bleeding Quality:  Bright red Amount:  Scant Timing:  Sporadic Progression:  Unchanged Chronicity:  New Context: constipation   Context: not defecation, not foreign body, not hemorrhoids, not rectal injury, not rectal pain and not spontaneously   Similar prior episodes: no   Relieved by:  Nothing Worsened by:  Nothing tried Ineffective treatments:  None tried Associated symptoms: abdominal pain   Associated symptoms: no fever and no vomiting   Risk factors: no anticoagulant use, no hx of colorectal cancer and no hx of colorectal surgery   Flank Pain Associated symptoms include abdominal pain.    Past Medical History  Diagnosis Date  . Seasonal allergies   . Depression   . Anxiety   . BV (bacterial vaginosis)   . Trichomonas   . Yeast vaginitis   . Diabetes mellitus    Past Surgical History  Procedure Laterality Date  . Tubal ligation    . Toe nail    . Endometrial biopsy  2 in 2013    negative   Family History  Problem Relation Age of Onset  . Diabetes Mother   . Stroke Mother   . Depression Sister   . Alcohol abuse Sister   . Diabetes Sister   . Depression Brother   . Diabetes Brother    History  Substance Use Topics  . Smoking status: Current Every Day Smoker -- 2.00 packs/day for 6 years    Types: Cigarettes  . Smokeless tobacco: Never Used     Comment: used patches before  . Alcohol Use: No   OB History   Grav Para Term Preterm Abortions TAB SAB Ect Mult Living                 Review of Systems  Constitutional: Negative for fever.   Gastrointestinal: Positive for abdominal pain and hematochezia. Negative for vomiting.  Genitourinary: Positive for flank pain.  All other systems reviewed and are negative.    Allergies  Apple; Shellfish allergy; and Wellbutrin  Home Medications   Current Outpatient Rx  Name  Route  Sig  Dispense  Refill  . aspirin EC 81 MG EC tablet   Oral   Take 1 tablet (81 mg total) by mouth daily.   90 tablet   3   . atorvastatin (LIPITOR) 20 MG tablet   Oral   Take 1 tablet (20 mg total) by mouth daily at 6 PM.   30 tablet   1   . carvedilol (COREG) 3.125 MG tablet   Oral   Take 1 tablet (3.125 mg total) by mouth 2 (two) times daily with a meal.   60 tablet   1   . doxycycline (VIBRAMYCIN) 50 MG capsule   Oral   Take 1 capsule (50 mg total) by mouth 2 (two) times daily.   20 capsule   0   . EPINEPHrine (EPIPEN) 0.3 mg/0.3 mL DEVI   Intramuscular   Inject 0.3 mLs (0.3 mg total) into the muscle as needed.   1 Device   1   . glipiZIDE (GLUCOTROL) 5 MG tablet  Oral   Take 1 tablet (5 mg total) by mouth 2 (two) times daily with a meal.   60 tablet   3   . glucose blood (ONE TOUCH ULTRA TEST) test strip      Use one strip to check glucose daily.  Dx: 250.00   100 each   3   . ibuprofen (ADVIL,MOTRIN) 800 MG tablet   Oral   Take 1 tablet (800 mg total) by mouth every 8 (eight) hours as needed for pain.   30 tablet   0   . metroNIDAZOLE (FLAGYL) 500 MG tablet   Oral   Take 1 tablet (500 mg total) by mouth 2 (two) times daily.   14 tablet   0   . Multiple Vitamin (MULITIVITAMIN WITH MINERALS) TABS   Oral   Take 1 tablet by mouth daily.         . nitroGLYCERIN (NITROSTAT) 0.3 MG SL tablet   Sublingual   Place 1 tablet (0.3 mg total) under the tongue every 5 (five) minutes as needed for chest pain.   10 tablet   0   . omeprazole (PRILOSEC) 20 MG capsule   Oral   Take 1 capsule (20 mg total) by mouth 2 (two) times daily.   60 capsule   1   .  sucralfate (CARAFATE) 1 G tablet   Oral   Take 1 tablet (1 g total) by mouth 2 (two) times daily.   60 tablet   1    BP 147/92  Pulse 92  Temp(Src) 98.4 F (36.9 C) (Oral)  Resp 20  Ht 5\' 4"  (1.626 m)  Wt 163 lb (73.936 kg)  BMI 27.97 kg/m2  SpO2 99%  LMP 02/14/2013 Physical Exam  Nursing note and vitals reviewed. Constitutional: She is oriented to person, place, and time. She appears well-developed and well-nourished. No distress.  HENT:  Head: Normocephalic and atraumatic.  Eyes: EOM are normal. Pupils are equal, round, and reactive to light.  Neck: Normal range of motion. Neck supple.  Cardiovascular: Normal rate and regular rhythm.  Exam reveals no friction rub.   No murmur heard. Pulmonary/Chest: Effort normal and breath sounds normal. No respiratory distress. She has no wheezes. She has no rales.  Abdominal: Soft. She exhibits no distension. There is tenderness (LLQ - severe, mild suprapubic). There is no rebound.  Genitourinary: Guaiac negative stool. Cervix exhibits motion tenderness (very minimal, cervix appears normal). Cervix exhibits no discharge and no friability. Right adnexum displays no mass, no tenderness and no fullness. Left adnexum displays no mass, no tenderness and no fullness.  Musculoskeletal: Normal range of motion. She exhibits no edema.  Neurological: She is alert and oriented to person, place, and time.  Skin: She is not diaphoretic.    ED Course   Procedures (including critical care time)  Labs Reviewed  CBC WITH DIFFERENTIAL - Abnormal; Notable for the following:    HCT 35.5 (*)    MCHC 36.1 (*)    All other components within normal limits  BASIC METABOLIC PANEL - Abnormal; Notable for the following:    Glucose, Bld 211 (*)    GFR calc non Af Amer 82 (*)    All other components within normal limits  URINALYSIS, ROUTINE W REFLEX MICROSCOPIC  CBC  BASIC METABOLIC PANEL  HEPATIC FUNCTION PANEL   Ct Abdomen Pelvis W Contrast  03/02/2013    *RADIOLOGY REPORT*  Clinical Data: Rectal bleeding and left lower quadrant pain. Question diverticulitis.  CT ABDOMEN AND  PELVIS WITH CONTRAST  Technique:  Multidetector CT imaging of the abdomen and pelvis was performed following the standard protocol during bolus administration of intravenous contrast.  Contrast: OMNIPAQUE IOHEXOL 300 MG/ML  SOLN  Comparison: None.  Findings:  BODY WALL: Unremarkable.  LOWER CHEST:  Mediastinum: Unremarkable.  Lungs/pleura: No consolidation.  ABDOMEN/PELVIS:  Liver: No focal abnormality.  Biliary:  Borderline hepatic biliary dilatation.  No evidence of stone.  Unremarkable gallbladder  Pancreas: Unremarkable.  Spleen: Unremarkable.  Adrenals: Unremarkable.  Kidneys and ureters: No hydronephrosis or stone.  Bladder: Urine distended. Low attenuation in the region of the urethra is most likely small volume urine within the urethral canal, rather than urethral diverticulum.  Bowel: No obstruction. Normal appendix.  No evidence of diverticulitis.  Retroperitoneum: No mass or adenopathy.  Peritoneum: Trace free fluid in the lower left pelvis.  Reproductive: Right-sided corpus luteum.  Vascular: No acute abnormality.  OSSEOUS: No acute abnormalities.  IMPRESSION: Negative for diverticulitis or other acute intra-abdominal abnormality.   Original Report Authenticated By: Tiburcio Pea   1. Rectal bleeding   2. Hemorrhoid   3. Lower abdominal pain, left     MDM   42 year old female presents with left lower quadrant abdominal pain for the past 3 days. Radiating to the back or in the groin today. No fevers, however is having some nausea. Had one episode of painless bright red blood per rectum this morning. No blood clots. She is not on any blood thinners. She's afebrile vitals are stable here. She is severe left lower quadrant tenderness. Mild suprapubic tenderness. She has no CVA tenderness. Her pain is not seen consistent kidney stone. I feel like this is likely  diverticulitis. Will CT scan her abdomen give her pain medicines and check labs. Labs with stable hemoglobin, no leukocytosis.  CT scan reviewed by me, negative for diverticulitis. Patient's pelvic with no adnexal tenderness, very minimal cervical tenderness. Patient is not concerned for any STDs and denies vaginal discharge. No need to treat for cervicitics, PID. On rectal, small nonthrombosed hemorroid at anus. Very tender on rectal exam, unable to fully assess. Patient's bleeding likely from hemorrhoid, hemoccult negative. Unclear etiology of her 3 days of LLQ pain. Without diverticulitis on CT, reassuring pelvic without concerned for ovarian etiology, I feel she is stable for discharge and can f/u with PCP.   I have reviewed all labs and imaging and considered them in my medical decision making.   Dagmar Hait, MD 03/03/13 [phone]

## 2013-03-02 NOTE — ED Notes (Addendum)
Pt states she just used the restroom, did not have a BM, but reported dime size amount of red blood noted on tissue and in toilet. States she also has neck pain. Currently denies N/V/D, urinary problems

## 2013-03-03 LAB — WET PREP, GENITAL
Trich, Wet Prep: NONE SEEN
WBC, Wet Prep HPF POC: NONE SEEN

## 2013-03-04 LAB — URINE CULTURE: Culture: NO GROWTH

## 2013-03-31 ENCOUNTER — Encounter: Payer: Self-pay | Admitting: Family Medicine

## 2013-03-31 ENCOUNTER — Ambulatory Visit (INDEPENDENT_AMBULATORY_CARE_PROVIDER_SITE_OTHER): Payer: Medicare Other | Admitting: Family Medicine

## 2013-03-31 ENCOUNTER — Other Ambulatory Visit (HOSPITAL_COMMUNITY)
Admission: RE | Admit: 2013-03-31 | Discharge: 2013-03-31 | Disposition: A | Discharge status: Home / Self Care | Payer: Medicare Other | Source: Ambulatory Visit | Attending: Family Medicine | Admitting: Family Medicine

## 2013-03-31 VITALS — BP 137/91 | HR 81 | Temp 98.1°F | Ht 64.0 in | Wt 167.0 lb

## 2013-03-31 DIAGNOSIS — N63 Unspecified lump in unspecified breast: Secondary | ICD-10-CM

## 2013-03-31 DIAGNOSIS — Z113 Encounter for screening for infections with a predominantly sexual mode of transmission: Secondary | ICD-10-CM | POA: Diagnosis not present

## 2013-03-31 DIAGNOSIS — R102 Pelvic and perineal pain: Secondary | ICD-10-CM

## 2013-03-31 DIAGNOSIS — Z711 Person with feared health complaint in whom no diagnosis is made: Secondary | ICD-10-CM | POA: Diagnosis not present

## 2013-03-31 DIAGNOSIS — N938 Other specified abnormal uterine and vaginal bleeding: Secondary | ICD-10-CM | POA: Diagnosis not present

## 2013-03-31 DIAGNOSIS — E119 Type 2 diabetes mellitus without complications: Secondary | ICD-10-CM

## 2013-03-31 DIAGNOSIS — N644 Mastodynia: Secondary | ICD-10-CM

## 2013-03-31 DIAGNOSIS — N925 Other specified irregular menstruation: Secondary | ICD-10-CM | POA: Diagnosis not present

## 2013-03-31 DIAGNOSIS — M25559 Pain in unspecified hip: Secondary | ICD-10-CM

## 2013-03-31 DIAGNOSIS — N949 Unspecified condition associated with female genital organs and menstrual cycle: Secondary | ICD-10-CM

## 2013-03-31 DIAGNOSIS — N6310 Unspecified lump in the right breast, unspecified quadrant: Secondary | ICD-10-CM

## 2013-03-31 LAB — BASIC METABOLIC PANEL
BUN: 16 mg/dL (ref 6–23)
CO2: 27 mEq/L (ref 19–32)
Chloride: 104 mEq/L (ref 96–112)
Glucose, Bld: 106 mg/dL — ABNORMAL HIGH (ref 70–99)
Potassium: 4.2 mEq/L (ref 3.5–5.3)

## 2013-03-31 LAB — POCT URINALYSIS DIPSTICK
Blood, UA: NEGATIVE
Protein, UA: NEGATIVE
Spec Grav, UA: 1.02
Urobilinogen, UA: 0.2

## 2013-03-31 LAB — POCT WET PREP (WET MOUNT): Clue Cells Wet Prep Whiff POC: POSITIVE

## 2013-03-31 LAB — POCT GLYCOSYLATED HEMOGLOBIN (HGB A1C): Hemoglobin A1C: 5.7

## 2013-03-31 MED ORDER — METRONIDAZOLE 500 MG PO TABS: 500.0000 mg | ORAL_TABLET | Freq: Two times a day (BID) | ORAL | Status: DC

## 2013-03-31 MED ORDER — LORAZEPAM 1 MG PO TABS: 1.0000 mg | ORAL_TABLET | Freq: Once | ORAL | Status: DC | PRN

## 2013-03-31 NOTE — Assessment & Plan Note (Signed)
With concern for STD and breast tenderness U preg negative GCC collected BV on wet prep- flagyl 500 BID X 7 days UA negative for UTI

## 2013-03-31 NOTE — Assessment & Plan Note (Addendum)
Check RPR, GCC, and wet prep Tried HIV and TSH (for breast tenderness) but couldn't sign order as EMR states that is not covered, will work with what can be ordered, although I think an HIV test would be very beneficial.  Wet prep + for BV, treated per above Moderate cervical motion tenderness.

## 2013-03-31 NOTE — Assessment & Plan Note (Signed)
Ordered MRI per radiology reccs.

## 2013-03-31 NOTE — Assessment & Plan Note (Signed)
With pelvic pain, multiple HPO axis problems are in the differential She alos had uptake of isotope in 2 nodules in a cardiac nuclear stress test in 3/14, mammo and US WNL, but recc MRI Check prolactin, although there are no deficits to suggest prolactinoma Will proceed with MRI as recommedned by radiology after Mammo and [place].

## 2013-03-31 NOTE — Progress Notes (Signed)
  Subjective:    Patient ID: Cathy Bond, female    DOB: 1971/01/10, 42 y.o.   MRN: [id number]  HPI Pt here for same day for Breast tenderness and pelvic pain  Both problems have been going on for 3 weeks, She states that she had a light period last month tha t only lasted 3 days. Her usual period is regular every 4 weeks lasting 5 days with normal to heavy flow.   Breast tenderness - only to the touch, denys nipple discharge/inversion, skin changes, and new lumps/bumps.   Pelvic pain, suprapubic, no dyspareunia, no vaginal dc or irritation, or irreg bleeding.  Denies hair or nail changes, mood changes, cold/heat intolerance, or palpations Denys fevers and chills.  Had some transient constipation but her bowels are moving normally now.    Review of Systems Per HPI    Objective:   Physical Exam  Gen: NAD, alert, cooperative with exam HEENT: NCAT, EOMI, PERRL Breast: no :skin changes, gross asymetry, nipple inversion. normal feeling lumps/bumps without firm/fixed masses.  CV: RRR, good S1/S2, no murmur Resp: CTABL, no wheezes, non-labored Abd: SNTND, BS present, no guarding or organomegaly Ext: No edema, warm GU: thick white discharge, no cerical lesion, vaginal wall well rugated, + moderate cervical motion tenderness Neuro: Alert and oriented, strength 5/5 and sensation intact in all 4 extremities       Assessment & Plan:

## 2013-03-31 NOTE — Patient Instructions (Signed)
It was great to meet you today  I have ordered an MRI of your breasts  I have also ordered a few labs, I will let you know if we find anything that can explain your pain.

## 2013-03-31 NOTE — Assessment & Plan Note (Signed)
A1C checked today but problem not addressed At goal with A1C of 5.7

## 2013-04-08 ENCOUNTER — Telehealth: Payer: Self-pay | Admitting: *Deleted

## 2013-04-08 NOTE — Telephone Encounter (Signed)
Spoke with patient and informed her of MRI of breast at [facility] [address], for 04/28/2013 at 1:15pm.  Pt would also like to have results of blood work.  Will fwd to Dr. Ermalinda Memos for review.  Courteney Alderete, Darlyne Russian, CMA

## 2013-04-08 NOTE — Telephone Encounter (Signed)
Called and discussed that all of her labs were negative, and did not explain her symptoms. She stated that she understood.   Murtis Sink, MD [facility] Resident, PGY-2 04/08/2013, 12:24 PM

## 2013-04-15 ENCOUNTER — Emergency Department (INDEPENDENT_AMBULATORY_CARE_PROVIDER_SITE_OTHER)
Admission: EM | Admit: 2013-04-15 | Discharge: 2013-04-15 | Disposition: A | Discharge status: Home / Self Care | Payer: Medicare Other | Source: Home / Self Care | Attending: Family Medicine | Admitting: Family Medicine

## 2013-04-15 ENCOUNTER — Encounter (HOSPITAL_COMMUNITY): Payer: Self-pay | Admitting: *Deleted

## 2013-04-15 ENCOUNTER — Telehealth: Payer: Self-pay | Admitting: Family Medicine

## 2013-04-15 DIAGNOSIS — N898 Other specified noninflammatory disorders of vagina: Secondary | ICD-10-CM | POA: Diagnosis not present

## 2013-04-15 LAB — POCT URINALYSIS DIP (DEVICE)
Bilirubin Urine: NEGATIVE
Glucose, UA: NEGATIVE mg/dL
Hgb urine dipstick: NEGATIVE
Ketones, ur: NEGATIVE mg/dL
Nitrite: NEGATIVE
Specific Gravity, Urine: 1.01 (ref 1.005–1.030)

## 2013-04-15 MED ORDER — METRONIDAZOLE 0.75 % VA GEL: 1.0000 | Freq: Every day | VAGINAL | Status: DC

## 2013-04-15 NOTE — ED Provider Notes (Signed)
Cathy Bond is a 42 y.o. female who presents to Urgent Care today for dysuria or urinary urgency and frequency present for the last several days. This is associated with slight low back pain. She notes mild vaginal discharge. This is a continuation of her previous vaginal discharge. She was recently diagnosed with an treated for bacterial vaginosis. She denies having sex and she was recently treated in tested. She recently finished her course of metronidazole. She denies any fevers chills nausea vomiting or diarrhea. She states that her diabetes is well controlled with an A1c around 5. She feels well otherwise   Past Medical History  Diagnosis Date  . Seasonal allergies   . Depression   . Anxiety   . BV (bacterial vaginosis)   . Trichomonas   . Yeast vaginitis   . Diabetes mellitus    History  Substance Use Topics  . Smoking status: Current Every Day Smoker -- 2.00 packs/day for 6 years    Types: Cigarettes  . Smokeless tobacco: Never Used     Comment: used patches before  . Alcohol Use: No   ROS as above Medications reviewed. No current facility-administered medications for this encounter.   Current Outpatient Prescriptions  Medication Sig Dispense Refill  . aspirin EC 81 MG EC tablet Take 1 tablet (81 mg total) by mouth daily.  90 tablet  3  . docusate sodium (COLACE) 100 MG capsule Take 1 capsule (100 mg total) by mouth every 12 (twelve) hours.  60 capsule  0  . doxycycline (VIBRAMYCIN) 50 MG capsule Take 1 capsule (50 mg total) by mouth 2 (two) times daily.  20 capsule  0  . EPINEPHrine (EPIPEN) 0.3 mg/0.3 mL DEVI Inject 0.3 mLs (0.3 mg total) into the muscle as needed.  1 Device  1  . glipiZIDE (GLUCOTROL) 5 MG tablet Take 1 tablet (5 mg total) by mouth 2 (two) times daily with a meal.  60 tablet  3  . glucose blood (ONE TOUCH ULTRA TEST) test strip Use one strip to check glucose daily.  Dx: 250.00  100 each  3  . ibuprofen (ADVIL,MOTRIN) 800 MG tablet Take 1 tablet (800 mg  total) by mouth every 8 (eight) hours as needed for pain.  30 tablet  0  . LORazepam (ATIVAN) 1 MG tablet Take 1 tablet (1 mg total) by mouth once as needed for anxiety. Take 30 minutes before MRI  1 tablet  0  . metroNIDAZOLE (FLAGYL) 500 MG tablet Take 1 tablet (500 mg total) by mouth 2 (two) times daily.  14 tablet  0  . Multiple Vitamin (MULITIVITAMIN WITH MINERALS) TABS Take 1 tablet by mouth daily.      . Multiple Vitamins-Minerals (HAIR/SKIN/NAILS PO) Take 2 tablets by mouth 2 (two) times daily.      . nitroGLYCERIN (NITROSTAT) 0.3 MG SL tablet Place 1 tablet (0.3 mg total) under the tongue every 5 (five) minutes as needed for chest pain.  10 tablet  0    Exam:  BP 123/79  Pulse 64  Temp(Src) 97.8 F (36.6 C) (Oral)  Resp 18  SpO2 100%  LMP 04/15/2013 Gen: Well NAD HEENT: EOMI,  MMM Lungs: CTABL Nl WOB Heart: RRR no MRG Abd: NABS, NT, ND Exts: Non edematous BL  LE, warm and well perfused.   Results for orders placed during the hospital encounter of 04/15/13 (from the past 24 hour(s))  POCT URINALYSIS DIP (DEVICE)     Status: None   Collection Time  04/15/13  1:41 PM      Result Value Range   Glucose, UA NEGATIVE  NEGATIVE mg/dL   Bilirubin Urine NEGATIVE  NEGATIVE   Ketones, ur NEGATIVE  NEGATIVE mg/dL   Specific Gravity, Urine 1.010  1.005 - 1.030   Hgb urine dipstick NEGATIVE  NEGATIVE   pH 6.0  5.0 - 8.0   Protein, ur NEGATIVE  NEGATIVE mg/dL   Urobilinogen, UA 0.2  0.0 - 1.0 mg/dL   Nitrite NEGATIVE  NEGATIVE   Leukocytes, UA NEGATIVE  NEGATIVE   No results found.  Assessment and Plan: 42 y.o. female with bacterial vaginosis.  Patient has not done well with Flagyl in the past. Will use metronidazole vaginal cream. Followup with primary care provider. Discussed warning signs or symptoms. Please see discharge instructions. Patient expresses understanding.        Rodolph Bong, MD 04/15/13 1355

## 2013-04-15 NOTE — Telephone Encounter (Signed)
Pt called because she is having problems getting her test strips. The pharmacy said that there was something wrong with the prescription and needs to be corrected. She is not sure what is wrong and ask that we call the pharmacy and straighten this out. JW

## 2013-04-15 NOTE — ED Notes (Signed)
Pt  Reports   Symptoms         Symptoms  Of  Low  abd  Pain   And      Pain in  Flank  Area     tx  sev  Weeks  Ago  For  Vaginitis    -  Reports  Still  Has  A  Discharge

## 2013-04-16 ENCOUNTER — Telehealth: Payer: Self-pay | Admitting: *Deleted

## 2013-04-16 NOTE — Telephone Encounter (Signed)
Called pharmacy and left message to return call. P.S. teststrips were sent 01/27/13 with correct dx and also sigs (check glucose once daily) # 100 test strips with 3 refills. Lorenda Hatchet, Renato Battles

## 2013-04-23 NOTE — Telephone Encounter (Signed)
Error. .Cathy Bond  

## 2013-04-26 ENCOUNTER — Other Ambulatory Visit: Payer: Self-pay | Admitting: Family Medicine

## 2013-04-26 ENCOUNTER — Telehealth: Payer: Self-pay | Admitting: Family Medicine

## 2013-04-26 DIAGNOSIS — N63 Unspecified lump in unspecified breast: Secondary | ICD-10-CM

## 2013-04-26 MED ORDER — GLUCOSE BLOOD VI STRP: ORAL_STRIP | Status: DC

## 2013-04-26 MED ORDER — GLIPIZIDE 5 MG PO TABS: 5.0000 mg | ORAL_TABLET | Freq: Two times a day (BID) | ORAL | Status: DC

## 2013-04-26 NOTE — Telephone Encounter (Signed)
Prescriptions sent in. Thanks. --CMS

## 2013-04-26 NOTE — Telephone Encounter (Signed)
[phone] (alternate phone number) Pt would like refills for glipizide and test strips [facility] at [employer]

## 2013-04-28 ENCOUNTER — Ambulatory Visit
Admission: RE | Admit: 2013-04-28 | Discharge: 2013-04-28 | Disposition: A | Discharge status: Home / Self Care | Payer: Medicare Other | Source: Ambulatory Visit | Attending: Family Medicine | Admitting: Family Medicine

## 2013-04-28 ENCOUNTER — Other Ambulatory Visit: Payer: Self-pay | Admitting: Family Medicine

## 2013-04-28 DIAGNOSIS — N6310 Unspecified lump in the right breast, unspecified quadrant: Secondary | ICD-10-CM

## 2013-04-28 DIAGNOSIS — N644 Mastodynia: Secondary | ICD-10-CM

## 2013-04-28 DIAGNOSIS — N63 Unspecified lump in unspecified breast: Secondary | ICD-10-CM

## 2013-04-28 DIAGNOSIS — N6459 Other signs and symptoms in breast: Secondary | ICD-10-CM | POA: Diagnosis not present

## 2013-04-28 MED ORDER — GADOBENATE DIMEGLUMINE 529 MG/ML IV SOLN
15.0000 mL | Freq: Once | INTRAVENOUS | Status: AC | PRN
  Administered 2013-04-28: 15 mL via INTRAVENOUS

## 2013-05-25 ENCOUNTER — Telehealth: Payer: Self-pay | Admitting: Family Medicine

## 2013-05-25 NOTE — Telephone Encounter (Signed)
Paper Rx completed, signed by Dr. Lum Babe (pt has Medicare), and left at front desk for pt to pick up at her convenience. Please call her to let her know and express my apologies for how much trouble she's had with this. Thanks. --CMS

## 2013-05-25 NOTE — Telephone Encounter (Signed)
Pt says [facility] says the prescription for her test strips is wrong. She would like to come in and pick up a written prescrption for them

## 2013-05-25 NOTE — Telephone Encounter (Signed)
Pt notified.  Georgina Krist L, CMA  

## 2013-05-27 ENCOUNTER — Other Ambulatory Visit: Payer: Self-pay

## 2013-07-05 ENCOUNTER — Ambulatory Visit (INDEPENDENT_AMBULATORY_CARE_PROVIDER_SITE_OTHER): Payer: Medicare Other | Admitting: Family Medicine

## 2013-07-05 ENCOUNTER — Encounter: Payer: Self-pay | Admitting: Family Medicine

## 2013-07-05 VITALS — BP 144/91 | HR 74 | Ht 64.0 in | Wt 170.0 lb

## 2013-07-05 DIAGNOSIS — E119 Type 2 diabetes mellitus without complications: Secondary | ICD-10-CM

## 2013-07-05 DIAGNOSIS — J069 Acute upper respiratory infection, unspecified: Secondary | ICD-10-CM

## 2013-07-05 LAB — GLUCOSE, CAPILLARY: Glucose-Capillary: 84 mg/dL (ref 70–99)

## 2013-07-05 MED ORDER — DM-GUAIFENESIN ER 60-1200 MG PO TB12: 1.0000 | ORAL_TABLET | Freq: Two times a day (BID) | ORAL | Status: DC

## 2013-07-05 NOTE — Patient Instructions (Signed)
Thank you for coming in, today!  I sent you a prescription for dextromethorphan + guaifenisine (Robitussin plus Mucinex). This will help with your cough and cold symptoms. You can take it twice per day. You can also use Tylenol for pain or fever, and / or cetirizine (Zyrtec) for itching or allergy symptoms. You can try over the counter hydrocortisone cream for rash or itching. You should also drink lots of fluids to stay well-hydrated, even if you don't feel like eating. If you have really high fever (over 101), worse vomiting, worse pain, worse difficulty breathing, worse rash, or new symptoms, come back sooner rather than later.  Make an appointment to come see me in about a month, or sooner if you need. Please feel free to call with any questions or concerns at any time, at [phone]. --Dr. Casper Harrison

## 2013-07-05 NOTE — Progress Notes (Signed)
   Subjective:    Patient ID: Cathy Bond, female    DOB: [birth year], 43 y.o.   MRN: [id number]  HPI: Pt presents to clinic for SDA for shortness of breath for 4 days, worse at night. She has had congestion, cough, runny nose, sore throat, ear pain/stuffiness for about 2 weeks. She has taken Tylenol without much relief. The shortness of breath is described as wheezing with increased work of breathing. Her SOB clears some with cough; coughing has been productive of thick, whitish sputum. Pt has been vomiting; emesis is yellowish stomach contents, and mostly post-tussive. Pt has not had diarrhea. Pt has had some body aches. Pt did not have a flu shot this year but has had no sick contacts.  Of note, pt has been unable to get blood glucose test strips for several weeks due to problems with the Rx / pharmacy. She requests a blood sugar check, today.  Review of Systems: As above. Pt has also had some rash to the back of her neck which started before her other symptoms; she did notice some rash to her neck today, and Claritin did not help.     Objective:   Physical Exam BP 144/91  Pulse 74  Ht 5\' 4"  (1.626 m)  Wt 170 lb (77.111 kg)  BMI 29.17 kg/m2  SpO2 97% Gen: non-toxic appearing adult female in no acute distress HEENT: [place]/AT, MMM, EOMI, PERRLA  Faint, papular rash to posterior neck and around onto sides of neck  Rash slightly red, with few areas of excoriation but no frank bleeding or drainage Pulm: CTAB, slightly coarse transmitted upper airway sounds that clear with cough Cardio: RRR, no murmur appreciated Abd: soft, nontender, BS+ Ext: warm, well-perfused, no LE edema     Assessment & Plan:

## 2013-07-05 NOTE — Assessment & Plan Note (Signed)
Generally non-toxic appearing, very likely viral URI. No evidence for pneumonia, currently and history not concerning for bacterial pharyngitis or sinusitis. Rx for dextromethorphan-guaifenesin PRN. Recommended supportive care, otherwise. Reviewed red flags that would prompt immediate return to care. F/u as needed and in 1 month for formal general f/u of chronic issues such as DM, etc.

## 2013-07-05 NOTE — Assessment & Plan Note (Signed)
Last A1c at goal, but pt unable to check CBG's at home due to not getting strips (problem reported with Rx / pharmacy). Reports compliance with therapies, otherwise. CBG in clinic today 84. Paper Rx completed by me and signed by attending Dr. Randolm Idol, provided to pt today. F/u in about 1 month.

## 2013-07-30 IMAGING — MG MM DIGITAL DIAGNOSTIC BILAT
8 series · 8 of 8 positions shown · non-contrast
Comparison: None

CLINICAL DATA: 41-year-old female with two small separate areas of
abnormal right breast uptake on recent mild cardiac perfusion
study.

DIGITAL DIAGNOSTIC BILATERAL MAMMOGRAM WITH CAD AND RIGHT BREAST
ULTRASOUND:

[R CC (1 of 3)]
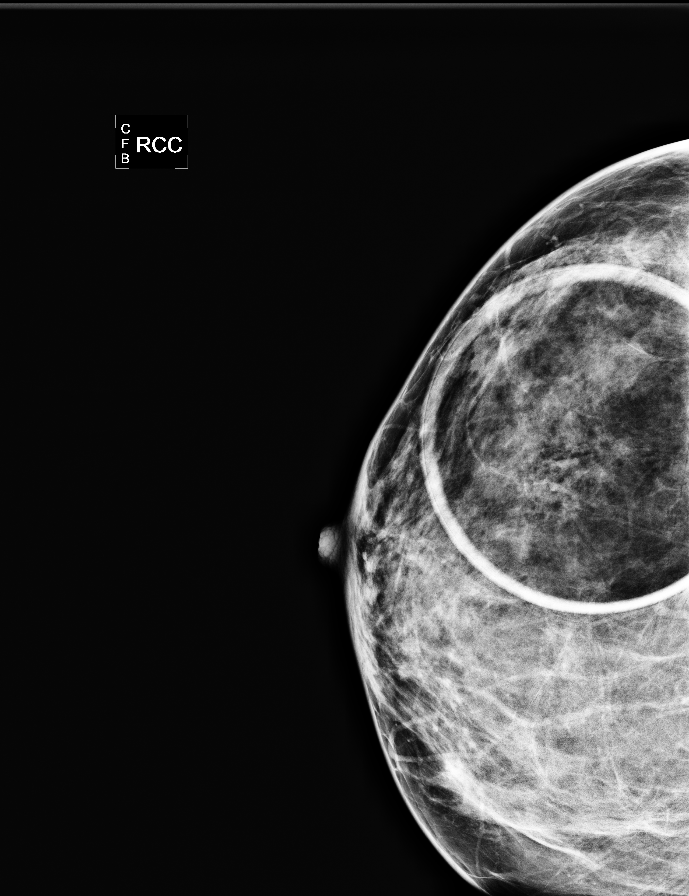

[R CC (2 of 3)]
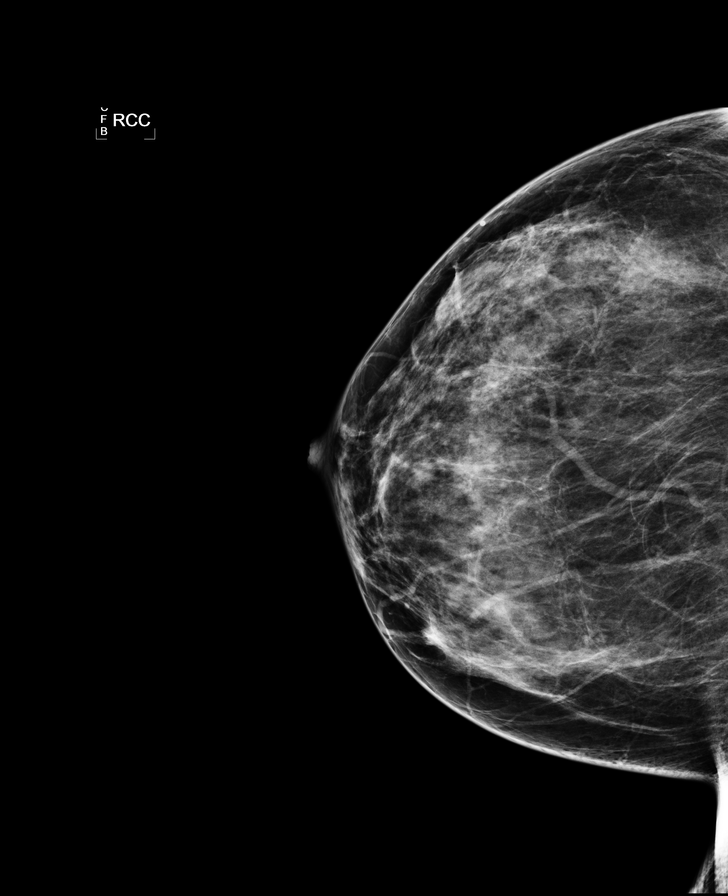

[L CC]
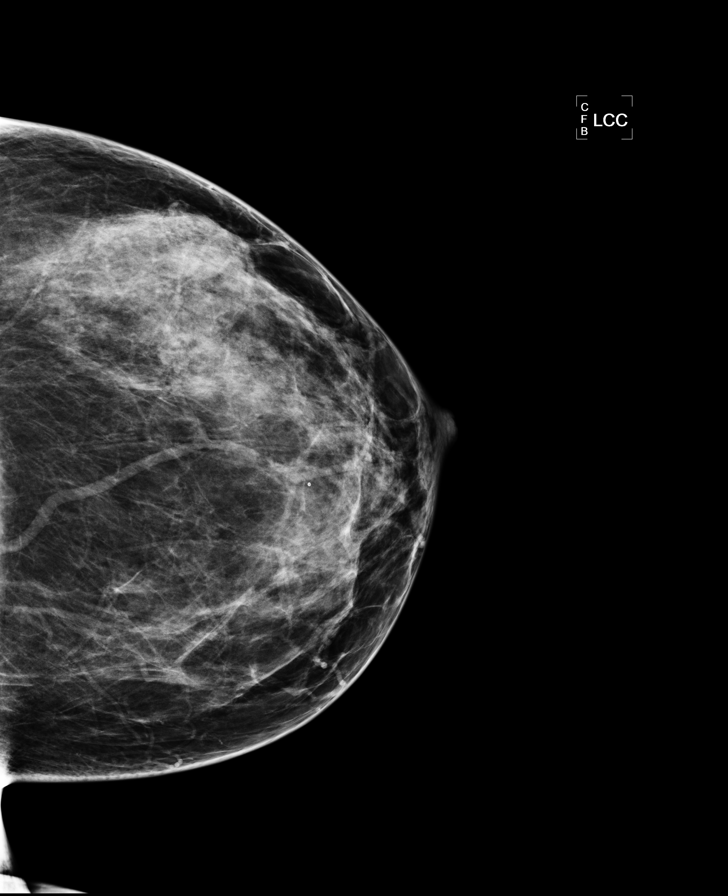

[L MLO]
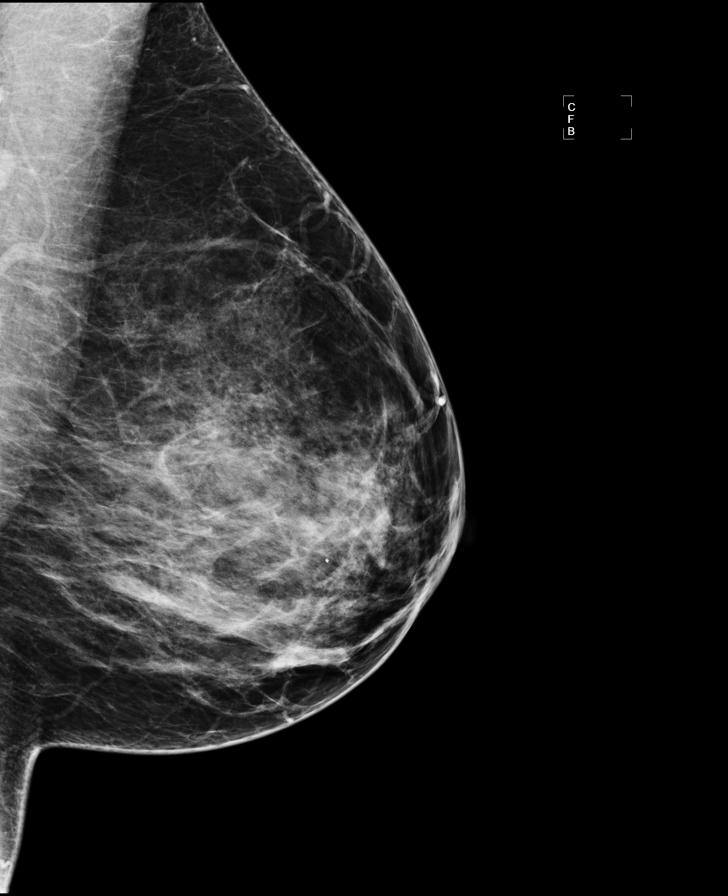

[R MLO (1 of 3)]
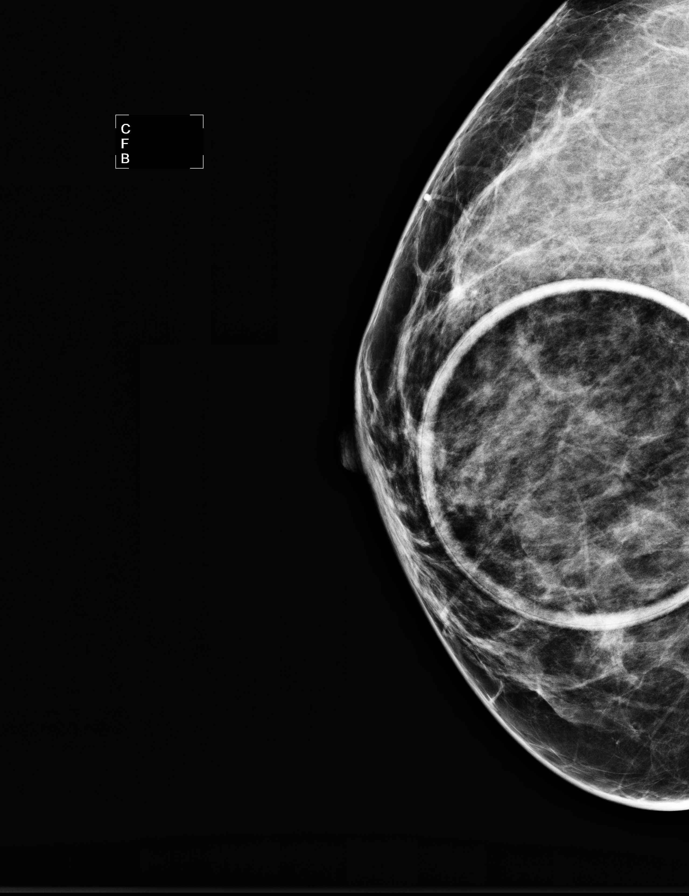

[R MLO (2 of 3)]
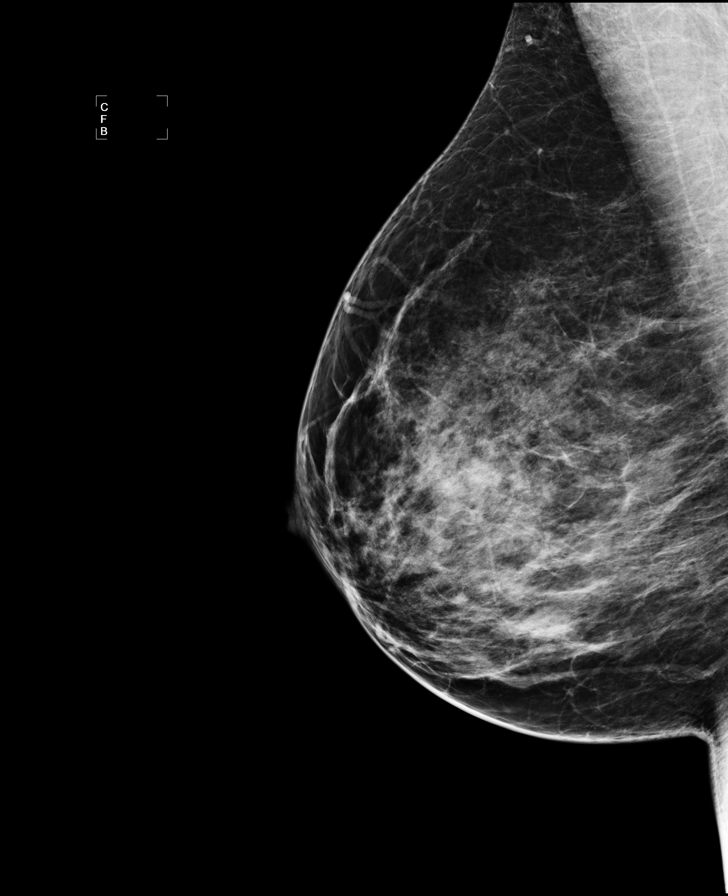

[R CC (3 of 3)]
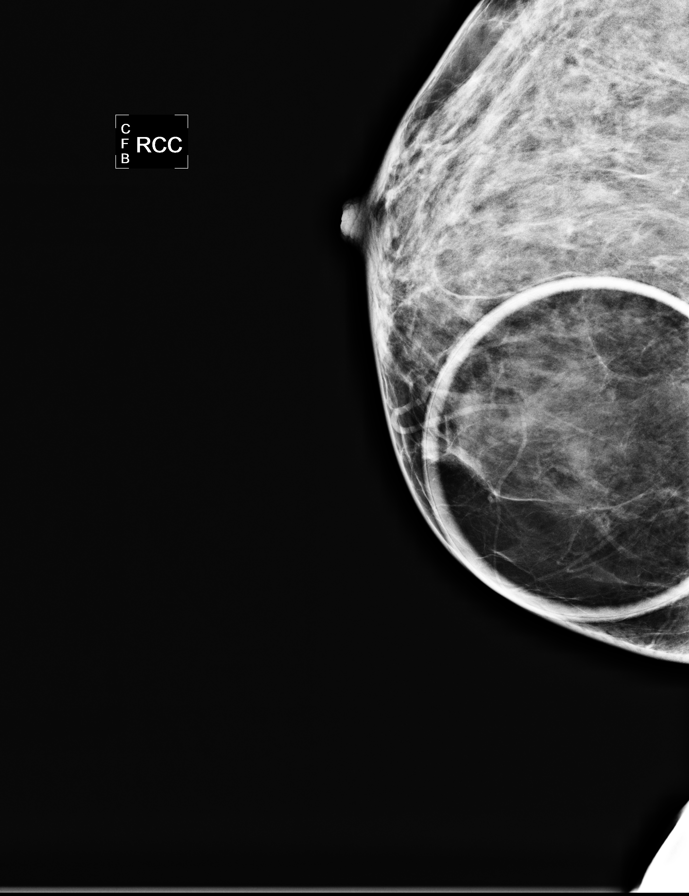

[R MLO (3 of 3)]
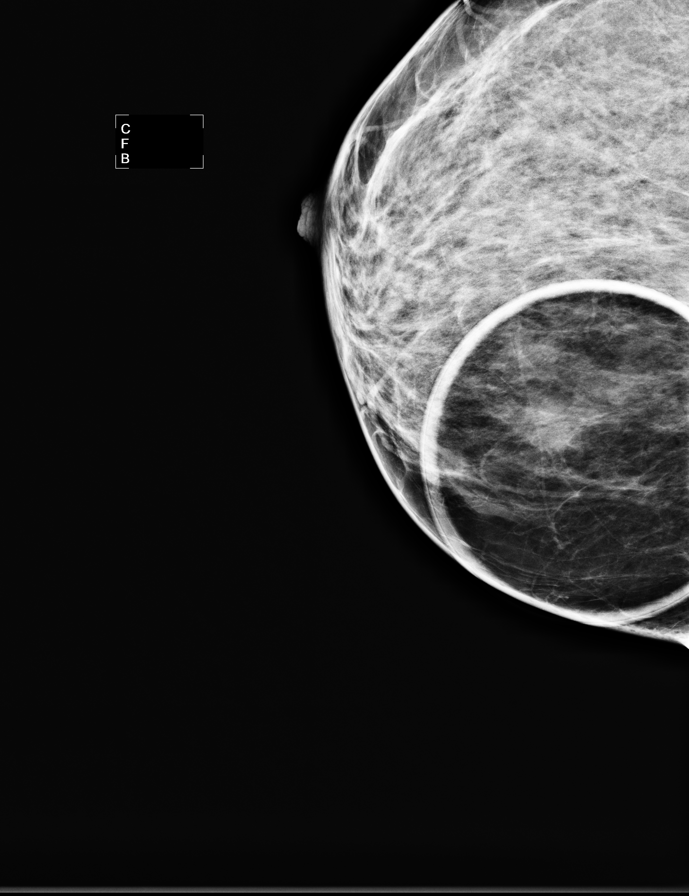

[8 of 8 positions shown; findings below may reference images not displayed]

FINDINGS: ACR Breast Density Category 3: The breast tissue is heterogeneously
dense.

Spot compression views of the right breast and routine views of
both breast demonstrate no definite mass, distortion or worrisome
calcifications bilaterally.

Mammographic images were processed with CAD.

On physical exam, no palpable abnormalities identified within the
right breast

Ultrasound is performed, showing no evidence of solid or cystic
mass, distortion or abnormal areas of shadowing within the right
breast.
IMPRESSION: No mammographic, palpable or sonographic abnormalities identified
within the right breast.  However given the abnormal right breast
activity on recent sestamibi exam, breast MRI with/without contrast
is recommended to exclude neoplasm.

BI-RADS CATEGORY 0:  Incomplete.  Need additional imaging
evaluation.

RECOMMENDATION:
Bilateral breast MRI with and without contrast.

I have discussed the findings and recommendations with the patient.
Results were also provided in writing at the conclusion of the
visit.

## 2013-08-31 ENCOUNTER — Other Ambulatory Visit: Payer: Self-pay | Admitting: Family Medicine

## 2013-10-11 ENCOUNTER — Ambulatory Visit (INDEPENDENT_AMBULATORY_CARE_PROVIDER_SITE_OTHER): Payer: Medicare Other | Admitting: Family Medicine

## 2013-10-11 ENCOUNTER — Encounter: Payer: Self-pay | Admitting: Family Medicine

## 2013-10-11 ENCOUNTER — Other Ambulatory Visit (HOSPITAL_COMMUNITY)
Admission: RE | Admit: 2013-10-11 | Discharge: 2013-10-11 | Disposition: A | Discharge status: Home / Self Care | Payer: Medicare Other | Source: Ambulatory Visit | Attending: Family Medicine | Admitting: Family Medicine

## 2013-10-11 VITALS — BP 141/88 | HR 61 | Temp 97.8°F | Ht 64.0 in | Wt 168.0 lb

## 2013-10-11 DIAGNOSIS — Z113 Encounter for screening for infections with a predominantly sexual mode of transmission: Secondary | ICD-10-CM | POA: Diagnosis not present

## 2013-10-11 DIAGNOSIS — N760 Acute vaginitis: Secondary | ICD-10-CM

## 2013-10-11 DIAGNOSIS — B9689 Other specified bacterial agents as the cause of diseases classified elsewhere: Secondary | ICD-10-CM

## 2013-10-11 DIAGNOSIS — M25561 Pain in right knee: Secondary | ICD-10-CM | POA: Insufficient documentation

## 2013-10-11 DIAGNOSIS — M25569 Pain in unspecified knee: Secondary | ICD-10-CM | POA: Diagnosis not present

## 2013-10-11 DIAGNOSIS — A499 Bacterial infection, unspecified: Secondary | ICD-10-CM

## 2013-10-11 LAB — POCT WET PREP (WET MOUNT)
CLUE CELLS WET PREP WHIFF POC: POSITIVE
WBC, Wet Prep HPF POC: <5

## 2013-10-11 MED ORDER — METRONIDAZOLE 0.75 % VA GEL: 1.0000 | Freq: Every day | VAGINAL | Status: DC

## 2013-10-11 MED ORDER — METRONIDAZOLE 500 MG PO TABS: 500.0000 mg | ORAL_TABLET | Freq: Three times a day (TID) | ORAL | Status: DC

## 2013-10-11 NOTE — Progress Notes (Signed)
   Subjective:    Patient ID: Cathy Bond, female    DOB: Mar 13, 1971, 43 y.o.   MRN: [id number]  HPI: Pt presents to clinic for SDA for pelvic pain, also complaining of right knee pain. Details below. Note: pt requests paper Rx for blood glucose strips.  Pelvic pain - complains of pain with intercourse (without bleeding) and small amount of vaginal discharge - discharge is whitish, thick; reports she has had BV several times in the past, which was similar - pain present for about 1 week, discharge for 2 weeks - denies itching, skin lesions or breaks - has had regular periods except for her most recent one, which was prolonged a few days - has had no new sexual partners other than a stable boyfriend  Right knee pain - new, started around 8 weeks ago - has run into recently deceased father's hover-round / other equipment several times over 2 months - pain in knee itself, with swelling, with sometimes pain in foot - has taken no medications for it; worse with standing / walking, and generally not improving - occasionally has some limping and complains of occasional 'catching,' which is painful  Review of Systems: As above.     Objective:   Physical Exam BP 141/88  Pulse 61  Temp(Src) 97.8 F (36.6 C) (Oral)  Ht 5\' 4"  (1.626 m)  Wt 168 lb (76.204 kg)  BMI 28.82 kg/m2  LMP 09/28/2013 Gen: well-appearing adult female in NAD MSK:  Right knee with surrounding soft tissue slightly swollen compared to left but no frank effusion  No redness / skin breakdown of either knee  Mild crepitus bilaterally  Tenderness along medial joint line worse than lateral, and mild tenderness directly over patella  No ligamentous instability of knee  ROM full for bilateral knees, with increased pain with passive extension to limit (completely straight)  Positive pain with varus and valgus stress  No pain with patellar compresson GU: normal external vaginal / vulvar structures without frank lesions  or skin breakdown / ulceration / blisters  Speculum: normal vaginal mucosae with moderate amount thick, whitish discharge; no bleeding noted   Cervix not completely visualized but no obvious ulceration or other abnormality  Bimanual: mild CMT, no fundal tenderness, no discrete adnexal masses or tenderness     Assessment & Plan:

## 2013-10-11 NOTE — Assessment & Plan Note (Signed)
Whitish vaginal discharge with numerous clue cells on wet prep and positive whiff test, with history of BV with similar symptoms. Plan Metrogel x5 days (pt intolerant to Flagyl tablets in the past). Also collected GC/Chlamydia specimen today, and will f/u results as appropriate. Red flags reviewed. F/u as needed.

## 2013-10-11 NOTE — Patient Instructions (Addendum)
Thank you for coming in, today!  Your tests show BV again. I will prescribe metronidazole 500 mg three times per day, for 1 week. Avoid drinking ANY alcohol while taking this medicine and for 2-3 days afterward. Come back to see me if you have any other problems or if this does not get better.  For your knee: Try resting your knee with elevation above the level of your heart. You may use ice (15 minutes on, 15 minutes off, for an hour at a time) several times per day. Never place ice directly on the skin. You can take Tylenol for any pain.  If your pain is not better in the next week, give the sports medicine clinic a call. They can look more closely at the knee to see if anything needs to be done. Their number is [phone].  Please feel free to call with any questions or concerns at any time, at [phone]. --Dr. Venetia Maxon

## 2013-10-11 NOTE — Assessment & Plan Note (Signed)
Expect this represents mild ligamentous sprain. Recent history of a few mild injuries ('banging' knee on various furniture, etc) with exam generally unremarkable; some pain with palpation and full ROM, no obvious deformity or other abnormality. Recommended supportive / conservative measures for now (ice, rest, elevation, Tylenol, etc). Also suggested knee sleeve in the meantime. F/u in 1-2 weeks with me or sports medicine clinic if symptoms do not improve in 1-2 weeks. Otherwise f/u as needed.

## 2013-10-12 ENCOUNTER — Encounter: Payer: Self-pay | Admitting: Family Medicine

## 2013-10-12 LAB — CERVICOVAGINAL ANCILLARY ONLY
CHLAMYDIA, DNA PROBE: NEGATIVE
NEISSERIA GONORRHEA: NEGATIVE

## 2013-11-01 ENCOUNTER — Other Ambulatory Visit: Payer: Self-pay | Admitting: Family Medicine

## 2013-11-27 ENCOUNTER — Encounter (HOSPITAL_COMMUNITY): Payer: Self-pay | Admitting: Emergency Medicine

## 2013-11-27 ENCOUNTER — Emergency Department (HOSPITAL_COMMUNITY)
Admission: EM | Admit: 2013-11-27 | Discharge: 2013-11-27 | Disposition: A | Discharge status: Home / Self Care | Payer: Medicare Other | Attending: Emergency Medicine | Admitting: Emergency Medicine

## 2013-11-27 DIAGNOSIS — M25569 Pain in unspecified knee: Secondary | ICD-10-CM | POA: Diagnosis not present

## 2013-11-27 DIAGNOSIS — E119 Type 2 diabetes mellitus without complications: Secondary | ICD-10-CM | POA: Diagnosis not present

## 2013-11-27 DIAGNOSIS — N938 Other specified abnormal uterine and vaginal bleeding: Secondary | ICD-10-CM | POA: Diagnosis not present

## 2013-11-27 DIAGNOSIS — N949 Unspecified condition associated with female genital organs and menstrual cycle: Secondary | ICD-10-CM | POA: Insufficient documentation

## 2013-11-27 DIAGNOSIS — Z8709 Personal history of other diseases of the respiratory system: Secondary | ICD-10-CM | POA: Insufficient documentation

## 2013-11-27 DIAGNOSIS — Z79899 Other long term (current) drug therapy: Secondary | ICD-10-CM | POA: Insufficient documentation

## 2013-11-27 DIAGNOSIS — M25559 Pain in unspecified hip: Secondary | ICD-10-CM | POA: Diagnosis not present

## 2013-11-27 DIAGNOSIS — Z792 Long term (current) use of antibiotics: Secondary | ICD-10-CM | POA: Diagnosis not present

## 2013-11-27 DIAGNOSIS — N925 Other specified irregular menstruation: Secondary | ICD-10-CM | POA: Diagnosis not present

## 2013-11-27 DIAGNOSIS — Z3202 Encounter for pregnancy test, result negative: Secondary | ICD-10-CM | POA: Diagnosis not present

## 2013-11-27 DIAGNOSIS — Z9851 Tubal ligation status: Secondary | ICD-10-CM | POA: Diagnosis not present

## 2013-11-27 DIAGNOSIS — Z8619 Personal history of other infectious and parasitic diseases: Secondary | ICD-10-CM | POA: Insufficient documentation

## 2013-11-27 DIAGNOSIS — G8929 Other chronic pain: Secondary | ICD-10-CM | POA: Insufficient documentation

## 2013-11-27 DIAGNOSIS — F411 Generalized anxiety disorder: Secondary | ICD-10-CM | POA: Diagnosis not present

## 2013-11-27 DIAGNOSIS — Z7982 Long term (current) use of aspirin: Secondary | ICD-10-CM | POA: Insufficient documentation

## 2013-11-27 DIAGNOSIS — M2550 Pain in unspecified joint: Secondary | ICD-10-CM

## 2013-11-27 DIAGNOSIS — M7989 Other specified soft tissue disorders: Secondary | ICD-10-CM | POA: Insufficient documentation

## 2013-11-27 DIAGNOSIS — F17200 Nicotine dependence, unspecified, uncomplicated: Secondary | ICD-10-CM | POA: Insufficient documentation

## 2013-11-27 LAB — URINE MICROSCOPIC-ADD ON

## 2013-11-27 LAB — WET PREP, GENITAL
Clue Cells Wet Prep HPF POC: NONE SEEN
Trich, Wet Prep: NONE SEEN
Yeast Wet Prep HPF POC: NONE SEEN

## 2013-11-27 LAB — CBC
HEMATOCRIT: 36.1 % (ref 36.0–46.0)
HEMOGLOBIN: 12.5 g/dL (ref 12.0–15.0)
MCH: 28.3 pg (ref 26.0–34.0)
MCHC: 34.6 g/dL (ref 30.0–36.0)
MCV: 81.9 fL (ref 78.0–100.0)
Platelets: 277 10*3/uL (ref 150–400)
RBC: 4.41 MIL/uL (ref 3.87–5.11)
RDW: 13.5 % (ref 11.5–15.5)
WBC: 5.3 10*3/uL (ref 4.0–10.5)

## 2013-11-27 LAB — PREGNANCY, URINE: PREG TEST UR: NEGATIVE

## 2013-11-27 LAB — URINALYSIS, ROUTINE W REFLEX MICROSCOPIC
Bilirubin Urine: NEGATIVE
Glucose, UA: NEGATIVE mg/dL
KETONES UR: NEGATIVE mg/dL
NITRITE: NEGATIVE
PROTEIN: NEGATIVE mg/dL
Specific Gravity, Urine: 1.009 (ref 1.005–1.030)
Urobilinogen, UA: 0.2 mg/dL (ref 0.0–1.0)
pH: 6 (ref 5.0–8.0)

## 2013-11-27 LAB — SAMPLE TO BLOOD BANK

## 2013-11-27 LAB — BASIC METABOLIC PANEL
BUN: 12 mg/dL (ref 6–23)
CO2: 23 mEq/L (ref 19–32)
Calcium: 9 mg/dL (ref 8.4–10.5)
Chloride: 104 mEq/L (ref 96–112)
Creatinine, Ser: 0.76 mg/dL (ref 0.50–1.10)
Glucose, Bld: 61 mg/dL — ABNORMAL LOW (ref 70–99)
POTASSIUM: 3.9 meq/L (ref 3.7–5.3)
SODIUM: 139 meq/L (ref 137–147)

## 2013-11-27 NOTE — ED Provider Notes (Signed)
CSN: [id number]     Arrival date & time 11/27/13  1219 History   First MD Initiated Contact with Patient 11/27/13 1238     Chief Complaint  Patient presents with  . Leg Swelling    Right leg  . Vaginal Bleeding    x 2 weeks.     (Consider location/radiation/quality/duration/timing/severity/associated sxs/prior Treatment) Patient is a 43 y.o. female presenting with vaginal bleeding. The history is provided by the patient.  Vaginal Bleeding  patient here complaining of chronic right knee and hip pain x3 months. Pain characterized as sharp and worse with standing or movement. Denies any history of trauma. No fevers chills. Some swelling to her right knee noted. Hasn't seen her Dr. for similar symptoms and has been treated conservatively.  She also complains of vaginal bleeding since the start of her normal menstrual cycle x2 weeks. Note some abdominal cramping but denies any severe pain no dysuria or hematuria. 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 This is similar. She's also had tubal ligation and denies any risk of pregnancy.  Past Medical History  Diagnosis Date  . Seasonal allergies   . Depression   . Anxiety   . BV (bacterial vaginosis)   . Trichomonas   . Yeast vaginitis   . Diabetes mellitus    Past Surgical History  Procedure Laterality Date  . Tubal ligation    . Toe nail    . Endometrial biopsy  2 in 2013    negative   Family History  Problem Relation Age of Onset  . Diabetes Mother   . Stroke Mother   . Depression Sister   . Alcohol abuse Sister   . Diabetes Sister   . Depression Brother   . Diabetes Brother    History  Substance Use Topics  . Smoking status: Current Every Day Smoker -- 2.00 packs/day for 6 years    Types: Cigarettes  . Smokeless tobacco: Never Used     Comment: used patches before  . Alcohol Use: No   OB History   Grav Para Term Preterm Abortions TAB SAB Ect Mult Living                 Review of Systems   Genitourinary: Positive for vaginal bleeding.  All other systems reviewed and are negative.     Allergies  Apple; Shellfish allergy; and Wellbutrin  Home Medications   Prior to Admission medications   Medication Sig Start Date End Date Taking? Authorizing Provider  aspirin EC 81 MG EC tablet Take 1 tablet (81 mg total) by mouth daily. 09/07/12   Leone Haven, MD  Dextromethorphan-Guaifenesin 60-1200 MG per 12 hr tablet Take 1 tablet by mouth every 12 (twelve) hours. 07/05/13   [place], MD  docusate sodium (COLACE) 100 MG capsule Take 1 capsule (100 mg total) by mouth every 12 (twelve) hours. 03/02/13   Osvaldo Shipper, MD  doxycycline (VIBRAMYCIN) 50 MG capsule Take 1 capsule (50 mg total) by mouth 2 (two) times daily. 01/11/13   Clovis Cao, MD  EPINEPHrine (EPIPEN) 0.3 mg/0.3 mL DEVI Inject 0.3 mLs (0.3 mg total) into the muscle as needed. 01/24/12   Teressa Lower, MD  glipiZIDE (GLUCOTROL) 5 MG tablet TAKE ONE TABLET BY MOUTH TWICE DAILY WITH  A  MEAL    Sharon [address], MD  glucose blood (ONE TOUCH ULTRA TEST) test strip Use one strip to check sugar once daily. Dx: 250.00 04/26/13   Emmaline Kluver,  MD  ibuprofen (ADVIL,MOTRIN) 800 MG tablet Take 1 tablet (800 mg total) by mouth every 8 (eight) hours as needed for pain. 05/14/12   Vivi Ferns, MD  LORazepam (ATIVAN) 1 MG tablet Take 1 tablet (1 mg total) by mouth once as needed for anxiety. Take 30 minutes before MRI 03/31/13   Timmothy Euler, MD  metroNIDAZOLE (METROGEL) 0.75 % vaginal gel Place 1 Applicatorful vaginally at bedtime. For 5 days 10/11/13   [place], MD  Multiple Vitamin (MULITIVITAMIN WITH MINERALS) TABS Take 1 tablet by mouth daily.    Historical Provider, MD  Multiple Vitamins-Minerals (HAIR/SKIN/NAILS PO) Take 2 tablets by mouth 2 (two) times daily.    Historical Provider, MD  nitroGLYCERIN (NITROSTAT) 0.3 MG SL tablet Place 1 tablet (0.3 mg total) under the tongue every 5  (five) minutes as needed for chest pain. 09/07/12   Leone Haven, MD   BP 123/82  Pulse 94  Temp(Src) 98.8 F (37.1 C) (Oral)  Resp 16  Ht 5\' 4"  (1.626 m)  Wt 163 lb 6 oz (74.106 kg)  BMI 28.03 kg/m2  SpO2 98%  LMP 11/27/2013 Physical Exam  Nursing note and vitals reviewed. Constitutional: She is oriented to person, place, and time. She appears well-developed and well-nourished.  Non-toxic appearance. No distress.  HENT:  Head: Normocephalic and atraumatic.  Eyes: Conjunctivae, EOM and lids are normal. Pupils are equal, round, and reactive to light.  Neck: Normal range of motion. Neck supple. No tracheal deviation present. No mass present.  Cardiovascular: Normal rate, regular rhythm and normal heart sounds.  Exam reveals no gallop.   No murmur heard. Pulmonary/Chest: Effort normal and breath sounds normal. No stridor. No respiratory distress. She has no decreased breath sounds. She has no wheezes. She has no rhonchi. She has no rales.  Abdominal: Soft. Normal appearance and bowel sounds are normal. She exhibits no distension. There is no tenderness. There is no rigidity, no rebound, no guarding and no CVA tenderness.  Genitourinary: There is bleeding around the vagina.  Musculoskeletal: Normal range of motion. She exhibits no edema and no tenderness.  Neurological: She is alert and oriented to person, place, and time. She has normal strength. No cranial nerve deficit or sensory deficit. GCS eye subscore is 4. GCS verbal subscore is 5. GCS motor subscore is 6.  Skin: Skin is warm and dry. No abrasion and no rash noted.  Psychiatric: She has a normal mood and affect. Her speech is normal and behavior is normal.    ED Course  Procedures (including critical care time) Labs Review Labs Reviewed  WET PREP, GENITAL  GC/CHLAMYDIA PROBE AMP  CBC  PREGNANCY, URINE  BASIC METABOLIC PANEL  URINALYSIS, ROUTINE W REFLEX MICROSCOPIC  SAMPLE TO BLOOD BANK    Imaging Review No results  found.   EKG Interpretation None      MDM   Final diagnoses:  None    Patient without signs of anemia. Suspect that her knee and hip pain are musculoskeletal in nature. No concern for DVT. She is to followup in the clinic    Leota Jacobsen, MD 11/27/13 1436

## 2013-11-27 NOTE — Discharge Instructions (Signed)
Abnormal Uterine Bleeding Abnormal uterine bleeding means bleeding from the vagina that is not your normal menstrual period. This can be:  Bleeding or spotting between periods.  Bleeding after sex (sexual intercourse).  Bleeding that is heavier or more than normal.  Periods that last longer than usual.  Bleeding after menopause. There are many problems that may cause this. Treatment will depend on the cause of the bleeding. Any kind of bleeding that is not normal should be reviewed by your doctor.  HOME CARE Watch your condition for any changes. These actions may lessen any discomfort you are having:  Do not use tampons or douches as told by your doctor.  Change your pads often. You should get regular pelvic exams and Pap tests. Keep all appointments for tests as told by your doctor. GET HELP IF:  You are bleeding for more than 1 week.  You feel dizzy at times. GET HELP RIGHT AWAY IF:   You pass out.  You have to change pads every 15 to 30 minutes.  You have belly pain.  You have a fever.  You become sweaty or weak.  You are passing large blood clots from the vagina.  You feel sick to your stomach (nauseous) and throw up (vomit). MAKE SURE YOU:  Understand these instructions.  Will watch your condition.  Will get help right away if you are not doing well or get worse. Document Released: 05/05/2009 Document Revised: 04/28/2013 Document Reviewed: 02/04/2013 [facility] Patient Information 2014 [place], [place].  Abnormal Uterine Bleeding Abnormal uterine bleeding means bleeding from the vagina that is not your normal menstrual period. This can be:  Bleeding or spotting between periods.  Bleeding after sex (sexual intercourse).  Bleeding that is heavier or more than normal.  Periods that last longer than usual.  Bleeding after menopause. There are many problems that may cause this. Treatment will depend on the cause of the bleeding. Any kind of bleeding that is  not normal should be reviewed by your doctor.  HOME CARE Watch your condition for any changes. These actions may lessen any discomfort you are having:  Do not use tampons or douches as told by your doctor.  Change your pads often. You should get regular pelvic exams and Pap tests. Keep all appointments for tests as told by your doctor. GET HELP IF:  You are bleeding for more than 1 week.  You feel dizzy at times. GET HELP RIGHT AWAY IF:   You pass out.  You have to change pads every 15 to 30 minutes.  You have belly pain.  You have a fever.  You become sweaty or weak.  You are passing large blood clots from the vagina.  You feel sick to your stomach (nauseous) and throw up (vomit). MAKE SURE YOU:  Understand these instructions.  Will watch your condition.  Will get help right away if you are not doing well or get worse. Document Released: 05/05/2009 Document Revised: 04/28/2013 Document Reviewed: 02/04/2013 [facility] Patient Information 2014 [place], [place].  Arthralgia Arthralgia is joint pain. A joint is a place where two bones meet. Joint pain can happen for many reasons. The joint can be bruised, stiff, infected, or weak from aging. Pain usually goes away after resting and taking medicine for soreness.  HOME CARE  Rest the joint as told by your doctor.  Keep the sore joint raised (elevated) for the first 24 hours.  Put ice on the joint area.  Put ice in a plastic bag.  Place  a towel between your skin and the bag.  Leave the ice on for 15-20 minutes, 03-04 times a day.  Wear your splint, casting, elastic bandage, or sling as told by your doctor.  Only take medicine as told by your doctor. Do not take aspirin.  Use crutches as told by your doctor. Do not put weight on the joint until told to by your doctor. GET HELP RIGHT AWAY IF:   You have bruising, puffiness (swelling), or more pain.  Your fingers or toes turn blue or start to lose feeling  (numb).  Your medicine does not lessen the pain.  Your pain becomes severe.  You have a temperature by mouth above 102 F (38.9 C), not controlled by medicine.  You cannot move or use the joint. MAKE SURE YOU:   Understand these instructions.  Will watch your condition.  Will get help right away if you are not doing well or get worse. Document Released: 06/26/2009 Document Revised: 09/30/2011 Document Reviewed: 06/26/2009 [facility] Patient Information 2014 [place], [place].

## 2013-11-27 NOTE — ED Notes (Addendum)
Pt c/o right leg swelling and vaginal bleed. Leg swelling ongoing x 3 months and vaginal bleeding x 2 weeks. Pt reports waking up today with sharp right sided abdominal pain that radiates to right shoulder. Pt reports using 2 pads in 1 hour.

## 2013-11-29 LAB — GC/CHLAMYDIA PROBE AMP
CT Probe RNA: NEGATIVE
GC PROBE AMP APTIMA: NEGATIVE

## 2014-01-10 ENCOUNTER — Other Ambulatory Visit: Payer: Self-pay | Admitting: Family Medicine

## 2014-02-02 ENCOUNTER — Emergency Department (HOSPITAL_COMMUNITY): Payer: Medicare Other

## 2014-02-02 ENCOUNTER — Emergency Department (HOSPITAL_COMMUNITY)
Admission: EM | Admit: 2014-02-02 | Discharge: 2014-02-02 | Disposition: A | Discharge status: Home / Self Care | Payer: Medicare Other | Attending: Emergency Medicine | Admitting: Emergency Medicine

## 2014-02-02 ENCOUNTER — Encounter (HOSPITAL_COMMUNITY): Payer: Self-pay | Admitting: Emergency Medicine

## 2014-02-02 DIAGNOSIS — L84 Corns and callosities: Secondary | ICD-10-CM

## 2014-02-02 DIAGNOSIS — F3289 Other specified depressive episodes: Secondary | ICD-10-CM | POA: Insufficient documentation

## 2014-02-02 DIAGNOSIS — Z3202 Encounter for pregnancy test, result negative: Secondary | ICD-10-CM | POA: Insufficient documentation

## 2014-02-02 DIAGNOSIS — E119 Type 2 diabetes mellitus without complications: Secondary | ICD-10-CM | POA: Insufficient documentation

## 2014-02-02 DIAGNOSIS — W2203XA Walked into furniture, initial encounter: Secondary | ICD-10-CM | POA: Insufficient documentation

## 2014-02-02 DIAGNOSIS — Z8619 Personal history of other infectious and parasitic diseases: Secondary | ICD-10-CM | POA: Insufficient documentation

## 2014-02-02 DIAGNOSIS — F411 Generalized anxiety disorder: Secondary | ICD-10-CM | POA: Insufficient documentation

## 2014-02-02 DIAGNOSIS — Y9289 Other specified places as the place of occurrence of the external cause: Secondary | ICD-10-CM | POA: Diagnosis not present

## 2014-02-02 DIAGNOSIS — F329 Major depressive disorder, single episode, unspecified: Secondary | ICD-10-CM | POA: Diagnosis not present

## 2014-02-02 DIAGNOSIS — R300 Dysuria: Secondary | ICD-10-CM | POA: Insufficient documentation

## 2014-02-02 DIAGNOSIS — F17200 Nicotine dependence, unspecified, uncomplicated: Secondary | ICD-10-CM | POA: Insufficient documentation

## 2014-02-02 DIAGNOSIS — M79674 Pain in right toe(s): Secondary | ICD-10-CM

## 2014-02-02 DIAGNOSIS — M79609 Pain in unspecified limb: Secondary | ICD-10-CM | POA: Diagnosis not present

## 2014-02-02 DIAGNOSIS — Z7982 Long term (current) use of aspirin: Secondary | ICD-10-CM | POA: Diagnosis not present

## 2014-02-02 DIAGNOSIS — S90129A Contusion of unspecified lesser toe(s) without damage to nail, initial encounter: Secondary | ICD-10-CM | POA: Diagnosis not present

## 2014-02-02 DIAGNOSIS — Z792 Long term (current) use of antibiotics: Secondary | ICD-10-CM | POA: Insufficient documentation

## 2014-02-02 DIAGNOSIS — Z79899 Other long term (current) drug therapy: Secondary | ICD-10-CM | POA: Insufficient documentation

## 2014-02-02 DIAGNOSIS — Z8742 Personal history of other diseases of the female genital tract: Secondary | ICD-10-CM | POA: Diagnosis not present

## 2014-02-02 DIAGNOSIS — Y9389 Activity, other specified: Secondary | ICD-10-CM | POA: Diagnosis not present

## 2014-02-02 LAB — URINALYSIS, ROUTINE W REFLEX MICROSCOPIC
Bilirubin Urine: NEGATIVE
GLUCOSE, UA: NEGATIVE mg/dL
Hgb urine dipstick: NEGATIVE
KETONES UR: NEGATIVE mg/dL
LEUKOCYTES UA: NEGATIVE
Nitrite: NEGATIVE
PROTEIN: NEGATIVE mg/dL
Specific Gravity, Urine: 1.011 (ref 1.005–1.030)
Urobilinogen, UA: 0.2 mg/dL (ref 0.0–1.0)
pH: 5.5 (ref 5.0–8.0)

## 2014-02-02 LAB — POC URINE PREG, ED: Preg Test, Ur: NEGATIVE

## 2014-02-02 LAB — CBG MONITORING, ED: GLUCOSE-CAPILLARY: 78 mg/dL (ref 70–99)

## 2014-02-02 NOTE — Discharge Instructions (Signed)
Continue all regular medications. Follow up with your primary care doctor as needed. Follow up with podiatrist for treatment of your callus. Keep toes buddy taped, ice, elevated. Ibuprofen for pain. Your urine analysis is normal today.   Corns and Calluses Corns are small areas of thickened skin that usually occur on the top, sides, or tip of a toe. They contain a cone-shaped core with a point that can press on a nerve below. This causes pain. Calluses are areas of thickened skin that usually develop on hands, fingers, palms, soles of the feet, and heels. These are areas that experience frequent friction or pressure. CAUSES  Corns are usually the result of rubbing (friction) or pressure from shoes that are too tight or do not fit properly. Calluses are caused by repeated friction and pressure on the affected areas. SYMPTOMS  A hard growth on the skin.  Pain or tenderness under the skin.  Sometimes, redness and swelling.  Increased discomfort while wearing tight-fitting shoes. DIAGNOSIS  Your caregiver can usually tell what the problem is by doing a physical exam. TREATMENT  Removing the cause of the friction or pressure is usually the only treatment needed. However, sometimes medicines can be used to help soften the hardened, thickened areas. These medicines include salicylic acid plasters and 12% ammonium lactate lotion. These medicines should only be used under the direction of your caregiver. HOME CARE INSTRUCTIONS   Try to remove pressure from the affected area.  You may wear donut-shaped corn pads to protect your skin.  You may use a pumice stone or nonmetallic nail file to gently reduce the thickness of a corn.  Wear properly fitted footwear.  If you have calluses on the hands, wear gloves during activities that cause friction.  If you have diabetes, you should regularly examine your feet. Tell your caregiver if you notice any problems with your feet. SEEK IMMEDIATE MEDICAL CARE  IF:   You have increased pain, swelling, redness, or warmth in the affected area.  Your corn or callus starts to drain fluid or bleeds.  You are not getting better, even with treatment. Document Released: 04/13/2004 Document Revised: 09/30/2011 Document Reviewed: 03/05/2011 [facility] Patient Information 2015 [place], [place]. This information is not intended to replace advice given to you by your health care provider. Make sure you discuss any questions you have with your health care provider.  Buddy Taping of Toes We have taped your toes together to keep them from moving. This is called "buddy taping" since we used a part of your own body to keep the injured part still. We placed soft padding between your toes to keep them from rubbing against each other. Buddy taping will help with healing and to reduce pain. Keep your toes buddy taped together for as long as directed by your caregiver. HOME CARE INSTRUCTIONS   Raise your injured area above the level of your heart while sitting or lying down. Prop it up with pillows.  An ice pack used every twenty minutes, while awake, for the first one to two days may be helpful. Put ice in a plastic bag and put a towel between the bag and your skin.  Watch for signs that the taping is too tight. These signs may be:  Numbness of your taped toes.  Coolness of your taped toes.  Color change in the area beyond the tape.  Increased pain.  If you have any of these signs, loosen or rewrap the tape. If you need to loosen or rewrap  the buddy tape, make sure you use the padding again. SEEK IMMEDIATE MEDICAL CARE IF:   You have worse pain, swelling, inflammation (soreness), drainage or bleeding after you rewrap the tape.  Any new problems occur. MAKE SURE YOU:   Understand these instructions.  Will watch your condition.  Will get help right away if you are not doing well or get worse. Document Released: 04/11/2004 Document Revised: 09/30/2011 Document  Reviewed: 07/05/2008 [facility] Patient Information 2015 [place], [place]. This information is not intended to replace advice given to you by your health care provider. Make sure you discuss any questions you have with your health care provider.

## 2014-02-02 NOTE — ED Provider Notes (Signed)
CSN: [id number]     Arrival date & time 02/02/14  0846 History   First MD Initiated Contact with Patient 02/02/14 0848     Chief Complaint  Patient presents with  . Dysuria  . Foot Pain     (Consider location/radiation/quality/duration/timing/severity/associated sxs/prior Treatment) HPI Cathy Bond is a 43 y.o. female who presents to emergency department with multiple complaints. Patient states that she has had urinary frequency for the last several weeks. She is diabetic and has not been checking her blood sugar because cannot afford the strips. She denies any dysuria, hematuria, urinary urgency. She denies any fever, chills. Denies any vaginal discharge or abdominal pain. There is no flank pain. Has not tried any medications for this. Patient is also complaining of pain to her left heel. States she thought she had a callus and bought over-the-counter medications and has been using it with no relief. She's unsure if there is a foreign body in the foot. Patient is also complaining of pain to the right fifth toe. Patient states that she hit it on the edge of a furniture 2 weeks ago and has had pain and bruising since. Pain is worsened with movement of the toe palpation of the toe.  Past Medical History  Diagnosis Date  . Seasonal allergies   . Depression   . Anxiety   . BV (bacterial vaginosis)   . Trichomonas   . Yeast vaginitis   . Diabetes mellitus    Past Surgical History  Procedure Laterality Date  . Tubal ligation    . Toe nail    . Endometrial biopsy  2 in 2013    negative   Family History  Problem Relation Age of Onset  . Diabetes Mother   . Stroke Mother   . Depression Sister   . Alcohol abuse Sister   . Diabetes Sister   . Depression Brother   . Diabetes Brother    History  Substance Use Topics  . Smoking status: Current Every Day Smoker -- 2.00 packs/day for 6 years    Types: Cigarettes  . Smokeless tobacco: Never Used     Comment: used patches before  .  Alcohol Use: No   OB History   Grav Para Term Preterm Abortions TAB SAB Ect Mult Living                 Review of Systems  Constitutional: Negative for fever and chills.  Respiratory: Negative.   Cardiovascular: Negative.   Gastrointestinal: Negative for nausea, vomiting, abdominal pain and constipation.  Genitourinary: Positive for frequency. Negative for dysuria, urgency, flank pain, vaginal bleeding, vaginal discharge, difficulty urinating, vaginal pain and pelvic pain.  Musculoskeletal: Positive for arthralgias. Negative for back pain.  Skin: Negative for rash.  Neurological: Negative for weakness and numbness.      Allergies  Apple; Shellfish allergy; and Wellbutrin  Home Medications   Prior to Admission medications   Medication Sig Start Date End Date Taking? Authorizing Provider  aspirin EC 81 MG EC tablet Take 1 tablet (81 mg total) by mouth daily. 09/07/12   Leone Haven, MD  Dextromethorphan-Guaifenesin 60-1200 MG per 12 hr tablet Take 1 tablet by mouth every 12 (twelve) hours. 07/05/13   Parrish, MD  docusate sodium (COLACE) 100 MG capsule Take 1 capsule (100 mg total) by mouth every 12 (twelve) hours. 03/02/13   Osvaldo Shipper, MD  doxycycline (VIBRAMYCIN) 50 MG capsule Take 1 capsule (50 mg total) by mouth 2 (two)  times daily. 01/11/13   Clovis Cao, MD  EPINEPHrine (EPIPEN) 0.3 mg/0.3 mL DEVI Inject 0.3 mLs (0.3 mg total) into the muscle as needed. 01/24/12   Teressa Lower, MD  glipiZIDE (GLUCOTROL) 5 MG tablet TAKE ONE TABLET BY MOUTH TWICE DAILY WITH  A  MEAL 01/10/14   Sharon Mt Street, MD  glucose blood (ONE TOUCH ULTRA TEST) test strip Use one strip to check sugar once daily. Dx: 250.00 04/26/13   Sharon Mt [address], MD  ibuprofen (ADVIL,MOTRIN) 800 MG tablet Take 1 tablet (800 mg total) by mouth every 8 (eight) hours as needed for pain. 05/14/12   Vivi Ferns, MD  LORazepam (ATIVAN) 1 MG tablet Take 1 tablet (1 mg total) by mouth once as  needed for anxiety. Take 30 minutes before MRI 03/31/13   Timmothy Euler, MD  metroNIDAZOLE (METROGEL) 0.75 % vaginal gel Place 1 Applicatorful vaginally at bedtime. For 5 days 10/11/13   [place], MD  Multiple Vitamin (MULITIVITAMIN WITH MINERALS) TABS Take 1 tablet by mouth daily.    Historical Provider, MD  Multiple Vitamins-Minerals (HAIR/SKIN/NAILS PO) Take 2 tablets by mouth 2 (two) times daily.    Historical Provider, MD  nitroGLYCERIN (NITROSTAT) 0.3 MG SL tablet Place 1 tablet (0.3 mg total) under the tongue every 5 (five) minutes as needed for chest pain. 09/07/12   Leone Haven, MD   BP 133/81  Pulse 81  Temp(Src) 98.6 F (37 C)  Resp 13  SpO2 99% Physical Exam  Nursing note and vitals reviewed. Constitutional: She is oriented to person, place, and time. She appears well-developed and well-nourished. No distress.  HENT:  Head: Normocephalic.  Eyes: Conjunctivae are normal.  Neck: Neck supple.  Cardiovascular: Normal rate, regular rhythm and normal heart sounds.   Pulmonary/Chest: Effort normal and breath sounds normal. No respiratory distress. She has no wheezes. She has no rales.  Abdominal: Soft. Bowel sounds are normal. She exhibits no distension. There is no tenderness. There is no rebound.  No CVA tenderness  Musculoskeletal: She exhibits no edema.  Mild bruising to the right toe. Tender to palpation over MTP, and IP joints. Pain with ROM. Calluse to the left heel, tender to palpation  Neurological: She is alert and oriented to person, place, and time.  Skin: Skin is warm and dry.  Psychiatric: She has a normal mood and affect. Her behavior is normal.    ED Course  Procedures (including critical care time) Labs Review Labs Reviewed  URINALYSIS, ROUTINE W REFLEX MICROSCOPIC  CBG MONITORING, ED  POC URINE PREG, ED    Imaging Review Dg Foot 2 Views Left  02/02/2014   CLINICAL DATA:  43 year old female with pain in the left heel. Initial  encounter.  EXAM: LEFT FOOT - 2 VIEW  COMPARISON:  02/25/2008.  FINDINGS: Stable and normal bone mineralization. Chronic degenerative spurring at the heel not significantly changed. Calcaneus intact. Joint spaces and alignment preserved. No acute fracture or dislocation.  IMPRESSION: No acute osseous abnormality identified in the left foot.   Electronically Signed   By: Lars Pinks M.D.   On: 02/02/2014 09:37   Dg Toe 5th Right  02/02/2014   CLINICAL DATA:  43 year old female with right fifth toe pain. Diabetes. Initial encounter.  EXAM: RIGHT FIFTH TOE  COMPARISON:  None.  FINDINGS: Three views of the right fifth toe. Interphalangeal joint space loss with mild degenerative spurring and possible subchondral cysts, but no osteolysis or suspicious osseous erosion. Fifth MTP appears  normal. No acute fracture or dislocation identified.  IMPRESSION: Right fifth toe IP joint changes which appear degenerative. No acute fracture or plain radiographic evidence of osteomyelitis identified.   Electronically Signed   By: Lars Pinks M.D.   On: 02/02/2014 09:39     EKG Interpretation None      MDM   Final diagnoses:  Callus of heel  Pain in toe of right foot  Type 2 diabetes mellitus without complication    Pt with left heel callus, right 5th toe pain and swelling. X-rays obtained, negative. Pt most likely will need to see a podiatrist for callus removal. Instructed to buddy tape toes, ice, elevate, NSAIDs. Pt's UA normal. Blood sugar normal. She denies any vaginal complaints, specifically asked if there is any possibility for an STI, pt stated no, deferred pelvic exam. She does not have abdominal pain. Afebrile. Will d/c home with outpatient follow up.   Spoke with case manager regarding glu meter sticks. Verified with pharmacy, will be able to fill for $2.   Filed Vitals:   02/02/14 0854 02/02/14 1114  BP: 133/81 125/82  Pulse: 81 62  Temp: 98.6 F (37 C) 97.9 F (36.6 C)  Resp: 13 16  SpO2: 99% 99%        Artia Singley A Loralei Radcliffe, PA-C 02/02/14 1600

## 2014-02-02 NOTE — ED Notes (Signed)
Left foot on her heel hurts  X 1 month thinks she may have FB in it ,  Rt pinky toe hurts after banging it on edge of bed and she thinks she may have UTI having pain when she voids x 2 weeks denies vag d/c has had her tubes tied 22 years ago

## 2014-02-02 NOTE — Progress Notes (Signed)
  CARE MANAGEMENT ED NOTE 02/02/2014  Patient:  DMIYAH, LISCANO   Account Number:  [id number]  Date Initiated:  02/02/2014  Documentation initiated by:  Edwyna Shell  Subjective/Objective Assessment:   43 yo female presenting to the ED with multiple complaints     Subjective/Objective Assessment Detail:     Action/Plan:   Patient will pick up test strip from pharmacy for $2.08, covered by Medicaid   Action/Plan Detail:   Anticipated DC Date:       Status Recommendation to Physician:   Result of Recommendation:  Agreed    [place]  CM consult  Medication Assistance    Choice offered to / List presented to:            Status of service:  Completed, signed off  ED Comments:   ED Comments Detail:  CM consulted to follow up with patients DM supplies. This CM spoke with the patient and she stated that she is unable to obtain her test strips. She stated that when she tries to get the prescription filled for the test strips that they tell her the Medicaid will not cover the strips. This CM called the [facility] at [facility] and spoke with pharmacy technician and she informed this CM that the patient has a prescription on file for the strips and she can come in today and get them filled for $2.08. This CM explained to the patient that she can get the scripts and they are covered by Medicaid. Explained to the patient that if she ever has any difficulty getting her medications or DM supplies to contact her PCP at [facility] to make them aware so they can help and also gave the patient this CM contact information for future assistance if needed. Patient verbalized understanding and had no other questions or concerns. ED PA and staff nurse updated.

## 2014-02-09 NOTE — ED Provider Notes (Signed)
Medical screening examination/treatment/procedure(s) were conducted as a shared visit with non-physician practitioner(s) and myself.  I personally evaluated the patient during the encounter.   Pt c/o urinary frequency. Hx dm.  No fevers or flank tenderness. abd soft nt. Labs.   Mirna Mires, MD 02/09/14 [phone]

## 2014-02-24 ENCOUNTER — Ambulatory Visit (INDEPENDENT_AMBULATORY_CARE_PROVIDER_SITE_OTHER): Payer: Medicare Other | Admitting: Family Medicine

## 2014-02-24 ENCOUNTER — Encounter: Payer: Self-pay | Admitting: Family Medicine

## 2014-02-24 VITALS — BP 125/79 | HR 80 | Temp 98.2°F | Resp 16 | Ht 64.0 in | Wt 161.0 lb

## 2014-02-24 DIAGNOSIS — E119 Type 2 diabetes mellitus without complications: Secondary | ICD-10-CM | POA: Diagnosis not present

## 2014-02-24 DIAGNOSIS — L84 Corns and callosities: Secondary | ICD-10-CM | POA: Insufficient documentation

## 2014-02-24 LAB — POCT GLYCOSYLATED HEMOGLOBIN (HGB A1C): Hemoglobin A1C: 5.7

## 2014-02-24 NOTE — Progress Notes (Signed)
   Subjective:    Patient ID: Cathy Bond, female    DOB: 1971-07-02, 43 y.o.   MRN: [id number]  HPI: Pt presents to clinic for f/u of diabetes with some reports of hypoglycemia, recently - pt reports compliance with glipizide 5 mg twice a day, but has been taking 2.5 mg twice a day for a few days - reports compliance with CBG checks, and does report some CBG's 50's, 40's on a couple of occasions - typically has been around 70's-80's, for a couple of weeks - last CBG was today, 78 - reports some dizziness when her blood sugars are low - reports normal diet and normal activity - does have a callous on her foot that she was seen in the ED for; she was told it was 'deep' and that she may need it removed "surgically"  Review of Systems: As above. Pt reports some headaches but no fevers / chills, cough, SOB, chest pain, abdominal pain, but some chronic constipation.     Objective:   Physical Exam BP 125/79  Pulse 80  Temp(Src) 98.2 F (36.8 C)  Resp 16  Ht 5\' 4"  (1.626 m)  Wt 161 lb (73.029 kg)  BMI 27.62 kg/m2  LMP 01/04/2014 GEn: well-appearing adult female in NAD HEENT: [place]/AT, EOMI, MMM Cardio: RRR, no murmur appreciated Pulm: CTAB, no wheezes Abd: soft, nontender, nondistended, BS+ Ext: warm, well-perfused, no LE edema Diabetic foot exam: several dysmorphic nails but no frank ulcerations, bleeding, or skin cracks  Large, hard calluses present to heels, L > R  Sensation grossly intact throughout bilateral feet, pulses intact / symmetric       Assessment & Plan:  See problem list notes.

## 2014-02-24 NOTE — Patient Instructions (Signed)
Thank you for coming in, today!  We will check your A1c and kidney function, today. I will call you or send you a letter with the results.  You can keep taking a half of a 5 mg tablet of glipizide twice a day, for now. If your blood sugars at home continue to be very low, come back to see me next month. We may need to adjust your medicine (try a different one, or try a lower dose of the glipizide).  I will refer you to the podiatrist, as well. They can help manage your heel callus.  Otherwise, come back to see me in about 3 month. Please feel free to call with any questions or concerns at any time, at [phone]. --Dr. Venetia Maxon

## 2014-02-24 NOTE — Assessment & Plan Note (Signed)
Pt with bilateral foot calluses, largest over right heel, with significant pain while walking, but diabetic foot exam otherwise grossly normal. Some dysmorphic nails, as well, with history of ingrown nails. Referred to podiatry, today. F/u as needed.

## 2014-02-24 NOTE — Assessment & Plan Note (Signed)
A: A1c at goal, though some apparent hypoglycemic episodes with glipizide 5 mg BID; pt has been halving this for a few days. Pt has lost weight, recently and feels maybe she "doesn't need as much medicine as before." Otherwise feels "okay," though does have some general discomfort with chronic constipation.  P: Continue glipizide but advised pt to continue 2.5 mg BID dosing, for now. BMP checked today. Rx's given for lancets and strips (completed by me, oversigned by Dr. Lindell Noe); pt to monitor sugars closely and if lows continue, she will f/u in 1 month for med adjustment. Otherwise plan to f/u in 3 months.

## 2014-02-25 ENCOUNTER — Encounter: Payer: Self-pay | Admitting: Family Medicine

## 2014-02-25 LAB — BASIC METABOLIC PANEL
BUN: 19 mg/dL (ref 6–23)
CHLORIDE: 104 meq/L (ref 96–112)
CO2: 29 meq/L (ref 19–32)
Calcium: 9.4 mg/dL (ref 8.4–10.5)
Creat: 0.94 mg/dL (ref 0.50–1.10)
GLUCOSE: 105 mg/dL — AB (ref 70–99)
POTASSIUM: 4 meq/L (ref 3.5–5.3)
Sodium: 141 mEq/L (ref 135–145)

## 2014-02-28 ENCOUNTER — Encounter: Payer: Self-pay | Admitting: Family Medicine

## 2014-03-09 ENCOUNTER — Ambulatory Visit: Payer: Self-pay | Admitting: Podiatry

## 2014-03-18 ENCOUNTER — Encounter: Payer: Self-pay | Admitting: Podiatrist

## 2014-03-18 ENCOUNTER — Ambulatory Visit (INDEPENDENT_AMBULATORY_CARE_PROVIDER_SITE_OTHER): Payer: Medicare Other | Admitting: Podiatrist

## 2014-03-18 VITALS — BP 130/80 | HR 65 | Resp 18

## 2014-03-18 DIAGNOSIS — M799 Soft tissue disorder, unspecified: Secondary | ICD-10-CM

## 2014-03-18 DIAGNOSIS — D2370 Other benign neoplasm of skin of unspecified lower limb, including hip: Secondary | ICD-10-CM

## 2014-03-18 NOTE — Patient Instructions (Signed)
If it comes back try the compound W or Dr. Cydney Ok Salicylic acid liquid or gel  Salicylic Acid topical gel, cream, lotion, solution What is this medicine? SALICYCLIC ACID (SAL i SIL ik AS id) breaks down layers of thick skin. It is used to treat common and plantar warts, psoriasis, calluses, and corns. It is also used to treat or to prevent acne. This medicine may be used for other purposes; ask your health care provider or pharmacist if you have questions. COMMON BRAND NAME(S): [place] Jabs, Clear Away Liquid, Compound W, Corn/Callus Remover, Dermarest Psoriasis Moisturizer, Dermarest Psoriasis Overnight Treatment, Dermarest Psoriasis Scalp Treatment, Dermarest Psoriasis Skin Treatment, DuoFilm Wart Remover, [place]ofilm, Hydrisalic, K[place], Neutrogena Acne Wash, [place], RE SA, SalAC, [employer], [place], [place], [place], [place] 2 in 1 Anti-Dandruff, [place], [place], Wart-Off What should I tell my health care provider before I take this medicine? They need to know if you have any of these conditions: -child with chickenpox, the flu, or other viral infection -kidney disease -liver disease -an unusual or allergic reaction to salicylic acid, other medicines, foods, dyes, or preservatives -pregnant or trying to get pregnant -breast-feeding How should I use this medicine? This medicine is for external use only. Follow the directions on the label. Do not apply to raw or irritated skin. Avoid getting medicine in your eyes, lips, nose, mouth, or other sensitive areas. Use this medicine at regular intervals. Do not use more often than directed. Talk to your pediatrician regarding the use of this medicine in children. Special care may be needed. This medicine is not approved for use in children under 61 years old. Overdosage: If you think you have taken too much of this medicine contact a poison control center or emergency room at once. NOTE: This medicine is only for you. Do not share this medicine  with others. What if I miss a dose? If you miss a dose, use it as soon as you can. If it is almost time for your next dose, use only that dose. Do not use double or extra doses. What may interact with this medicine? -medicines that change urine pH like ammonium chloride, sodium bicarbonate, and others -medicines that treat or prevent blood clots like warfarin -methotrexate -pyrazinamide -some medicines for diabetes -some medicines for gout -steroid medicines like prednisone or cortisone This list may not describe all possible interactions. Give your health care provider a list of all the medicines, herbs, non-prescription drugs, or dietary supplements you use. Also tell them if you smoke, drink alcohol, or use illegal drugs. Some items may interact with your medicine. What should I watch for while using this medicine? Tell your doctor is your symptoms do not get better or if they get worse. This medicine can make you more sensitive to the sun. Keep out of the sun. If you cannot avoid being in the sun, wear protective clothing and use sunscreen. Do not use sun lamps or tanning beds/booths. Use of this medicine in children under 12 years or in patients with kidney or liver disease may increase the risk of serious side effects. These patients should not use this medicine over large areas of skin. If you notice symptoms such as nausea, vomiting, dizziness, loss of hearing, ringing in the ears, unusual weakness or tiredness, fast or labored breathing, diarrhea, or confusion, stop using this medicine and contact your doctor or health care professional. What side effects may I notice from receiving this medicine? Side effects that you should report to your doctor or health care  professional as soon as possible: -allergic reactions like skin rash, itching or hives, swelling of the face, lips, or tongue Side effects that usually do not require medical attention (report to your doctor or health care  professional if they continue or are bothersome): -skin irritation This list may not describe all possible side effects. Call your doctor for medical advice about side effects. You may report side effects to FDA at 1-800-FDA-1088. Where should I keep my medicine? Keep out of the reach of children. Store at room temperature between 15 and 30 degrees C (59 and 86 degrees F). Do not freeze. Throw away any unused medicine after the expiration date. NOTE: This sheet is a summary. It may not cover all possible information. If you have questions about this medicine, talk to your doctor, pharmacist, or health care provider.  2015, Elsevier/Gold Standard. (2008-03-11 13:36:20)

## 2014-03-18 NOTE — Progress Notes (Signed)
   Subjective:    Patient ID: Cathy Bond, female    DOB: 04-19-1971, 43 y.o.   MRN: [id number]  HPI i have some thing on the bottom of my left foot and i went to the emergency room 02/24/14 and they told her to take tylenol and burns and throbs and numbness and tingling and my foot do not get cold and sore and tender and hurts with shoes and I am a diabetic and this has been going on for about 3 months    Review of Systems  HENT: Positive for sinus pressure.   Eyes: Positive for itching.  Cardiovascular:       Calf pain with walking  Gastrointestinal: Positive for constipation.  Musculoskeletal: Positive for back pain.       Joint pain  All other systems reviewed and are negative.      Objective:   Physical Exam Patient is awake, alert, and oriented x 3.  In no acute distress.  Vascular status is intact with palpable pedal pulses at 2/4 DP and PT bilateral and capillary refill time within normal limits. Neurological sensation is also intact bilaterally via Semmes Weinstein monofilament at 5/5 sites. Light touch, vibratory sensation, Achilles tendon reflex is intact. Dermatological exam reveals skin color, turger and texture as normal. No open lesions present.  Musculature intact with dorsiflexion, plantarflexion, inversion, eversion.  Discreet lesion present on the plantar aspect of the left heel.  Pain with direct pressure is present.  Skin tension lines are present.  Underlying integument is intact after debridement.      Assessment & Plan:  Benign soft tissue lesion plantar left heel/ diabetic  Plan:  Excised the lesion with a 15 blade without complication.  She will call if any problems or concerns arise in the future.  If the lesion comes back she will call.

## 2014-05-17 ENCOUNTER — Encounter: Payer: Self-pay | Admitting: Family Medicine

## 2014-05-17 ENCOUNTER — Ambulatory Visit (INDEPENDENT_AMBULATORY_CARE_PROVIDER_SITE_OTHER): Payer: Medicare Other | Admitting: Family Medicine

## 2014-05-17 VITALS — BP 138/83 | HR 66 | Temp 98.0°F | Ht 64.0 in | Wt 175.5 lb

## 2014-05-17 DIAGNOSIS — M25562 Pain in left knee: Secondary | ICD-10-CM

## 2014-05-17 DIAGNOSIS — L509 Urticaria, unspecified: Secondary | ICD-10-CM

## 2014-05-17 DIAGNOSIS — N912 Amenorrhea, unspecified: Secondary | ICD-10-CM | POA: Insufficient documentation

## 2014-05-17 DIAGNOSIS — E119 Type 2 diabetes mellitus without complications: Secondary | ICD-10-CM

## 2014-05-17 DIAGNOSIS — R1031 Right lower quadrant pain: Secondary | ICD-10-CM

## 2014-05-17 DIAGNOSIS — M25561 Pain in right knee: Secondary | ICD-10-CM

## 2014-05-17 LAB — POCT URINE PREGNANCY: PREG TEST UR: NEGATIVE

## 2014-05-17 MED ORDER — GLIPIZIDE ER 2.5 MG PO TB24: 2.5000 mg | ORAL_TABLET | Freq: Every day | ORAL | Status: DC

## 2014-05-17 NOTE — Assessment & Plan Note (Addendum)
Pt is under a comprehensive treatment plan for DM and attends clinic visits every 3-6 months. A: A1c at goal (better than goal) at last visit, and not due for recheck, but still with some hypoglycemic events through the day with glipizide 2.5 mg daily. Pt does feel she has gained some weight back and generally feels okay.  P: Continue glipizide but reduce to 2.5 mg 24-hour tablet once a day dosing. Continue CBG's daily. Monitor sugars and f/u as needed. Otherwise return to clinic in a few months and repeat A1c at that time. Also plan to Rx diabetic shoes / insoles, given hx of callus, s/p excision by podiatry.

## 2014-05-17 NOTE — Assessment & Plan Note (Signed)
Chronic / intermittent, possibly related to abnormal uterine bleeding. Unremarkable, currently, with minimal exam findings. [place] as per separate problem list note, with consideration for further testing (labs, abdominal imaging, etc), depending on US findings and any further changes in clinical appearance.

## 2014-05-17 NOTE — Progress Notes (Addendum)
Subjective:    Patient ID: Cathy Bond, female    DOB: March 03, 1971, 43 y.o.   MRN: [id number]  HPI: Pt presents to clinic for DM follow-up but also has complaints of hives and headaches, as well as 3 months of amenorrhea  DM - reports compliance with glipizide 2.5 mg BID and checks sugars daily - describes a few relative hypoglycemic episodes with sugars around high 60's (dips during the day), which causes tiredness and headaches - pt has seen podiatry and had a large callous removed, which has resolved her symptoms of pain (see last office note) - pt states she would like diabetic shoes / insoles to help prevent similar calluses, ulcers, etc - reports she is going to make an ophthalmologist appt  Hives / coryza-type symptoms - rash present on her arms, intermittently for a couple of weeks - reports itchiness with her rash, which is helped with cetirizine / loratadine (cetirizine works better) - also describes headaches, dizziness, itchy ears / eyes, and coughing without production - reports her sexual partner has been ill and may have the flu, with similar symptoms for about 2 weeks  Amenorrhea for 3 months - pt is sexually active but has had a BTL (1982) - does have some occasional spotting but last period was June 2015 and she was regular prior to that - reports feeling "full or bloated" at times but has no family history of GYN cancer - does report some groin pain for 3-4 days; other symptoms have been present since periods have been abnormal - pt has had no weight loss (has actually gained about 8 pounds) - pt is sexually active with one stable partner for many years and is otherwise without GYN or vaginal complaints  Bilateral knee pain - present on and off for months, right > left, today not as bad as some days - some relief with conservative measures, but Tylenol, ice / rest, and stretching does not fully alleviate pain - denies swelling, frank injury - denies buckling or  giving way of the knees - pain worse with standing for long periods of time Pt refuses flu shot, today.  Review of Systems: As above. Has some constipation but denies fever / chills, N/V, or diarrhea.     Objective:   Physical Exam BP 138/83  Pulse 66  Temp(Src) 98 F (36.7 C) (Oral)  Ht 5\' 4"  (1.626 m)  Wt 175 lb 8 oz (79.606 kg)  BMI 30.11 kg/m2 Gen: well-appearing adult female in NAD HEENT: [place]/AT, EOMI, PERRLA, TM's clear bilaterally  MMM, posterior oropharynx and nasal mucosae mildly red / inflamed  No tonsillar exudates, no cervical lymphadenopathy Cardio: RRR, no murmur Pulm: CTAB, no wheezes, normal WOB Abd: soft, nontender other than mild tenderness to palpation deeply in right groin, BS+, no masses appreciated Ext: warm, well-perfused, no LE edema  UE bilaterally without rashes, hives, or skin breakdown  Bilateral knees without gross deformity but some diffuse tenderness to palpation  No frank knee effusions and active ROM full, with increased pain on resisted passive ROM Skin: no rashes, as above, otherwise warm / dry / intact  Urine pregnancy test NEGATIVE     Assessment & Plan:  Urticaria - not present and potentially exacerbated by soap / detergeant changes - monitor clinically, avoid scented soaps / detergents and resume prior products - continue antihistamine PRN and f/u as needed, sooner rather than later if worsening  URI-type symptoms - close contact with flu-like symptoms, with otherwise well appearance -  likely viral; recommended symptomatic / supportive care and f/u PRN if worsening  Bilateral knee pain - continue conservative measures - defer xrays at this point - consider orthoses / braces as previous conservative measures have not been entirely helpful - consider sports medicine referral, injection, etc, if continuing to worsen  See problem list notes otherwise.   The above HPI was obtained with assistance from [facility], with  clarification from my own interview. The above exam and all assessments / plans reflect my independent exam and A/P.

## 2014-05-17 NOTE — Assessment & Plan Note (Signed)
A: No period for three months and no current bleeding, but has had spotting. No vaginal complaints, otherwise, and with unremarkable exam and negative urine pregnancy test. Note on chart review, pt has had similar issues in the past (abnormal bleeding) and had transvaginal US that showed a thickened endometrial stripe but pt did not f/u for endometrial biopsy, at that time. Note pt states she has been told she has fibroid(s) in the past.  P: Deferred pelvic exam today per pt preference, and doubt CBC or other labwork is necessary, yet. Ordered for pelvic and TV ultrasounds to evaluate anatomical cause for bleeding abnormality; also counseled pt that this may represent beginning of menopause. Will f/u imaging and determine further plans at that time; may decide to get endometrial biopsy and/or refer to OBGYN, based on findings.

## 2014-05-17 NOTE — Patient Instructions (Signed)
Thank you for coming in, today!  We talked about several things today.  For your diabetes, I will send in a new prescription for glipizide, the same medicine, but in a 24-hour tablet. The new dose is essentially half of what you're taking now, 2.5 mg per day. This should help get rid of your low sugars, which should also help your headaches.  Your pregnancy test is ____. We will get a pelvic ultrasound to look to see if anything is going on that we can see. If I find anything worrisome, I will let you know what to do. Otherwise, we'll watch to see if your periods are slowing down. This could potentially be the beginning of menopause.  For your congestion, use over-the-counter medications for allergies or sinus problems. The cetirizine should help with any allergies as well as your itching. Try to change back to your old soaps to see if that helps. If your hives continue or get worse, let me know.  Come back to see me in about another 3 months or sooner if you need. Please feel free to call with any questions or concerns at any time, at [phone]. --Dr. Venetia Maxon

## 2014-05-20 ENCOUNTER — Telehealth: Payer: Self-pay | Admitting: Family Medicine

## 2014-05-20 ENCOUNTER — Encounter: Payer: Self-pay | Admitting: Family Medicine

## 2014-05-20 DIAGNOSIS — M25562 Pain in left knee: Secondary | ICD-10-CM | POA: Insufficient documentation

## 2014-05-20 NOTE — Telephone Encounter (Signed)
Received fax from [facility] requesting Rx's and supporting documentation for diabetic shoes / insoles as well as knee orthosis braces. Rx's filled out and faxed. Dx code used originally for knee pain used was M25.651; received second fax stating ICD-10 code not valid per insurance guidelines. 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 Corrected form faxed back to [phone], as well. --CMS

## 2014-05-24 ENCOUNTER — Telehealth: Payer: Self-pay | Admitting: Family Medicine

## 2014-05-24 ENCOUNTER — Ambulatory Visit (HOSPITAL_COMMUNITY)
Admission: RE | Admit: 2014-05-24 | Discharge: 2014-05-24 | Disposition: A | Discharge status: Home / Self Care | Payer: Medicare Other | Source: Ambulatory Visit | Attending: Family Medicine | Admitting: Family Medicine

## 2014-05-24 DIAGNOSIS — E119 Type 2 diabetes mellitus without complications: Secondary | ICD-10-CM

## 2014-05-24 DIAGNOSIS — D251 Intramural leiomyoma of uterus: Secondary | ICD-10-CM | POA: Diagnosis not present

## 2014-05-24 DIAGNOSIS — N912 Amenorrhea, unspecified: Secondary | ICD-10-CM | POA: Diagnosis not present

## 2014-05-24 DIAGNOSIS — R1031 Right lower quadrant pain: Secondary | ICD-10-CM

## 2014-05-24 DIAGNOSIS — N840 Polyp of corpus uteri: Secondary | ICD-10-CM

## 2014-05-24 NOTE — Telephone Encounter (Signed)
Pelvic and TV ultrasound show suspected endometrial polyp and intramural fibroids. Will plan to call pt to discuss results; anticipate referral to OBGYN. --CMS

## 2014-05-25 DIAGNOSIS — D259 Leiomyoma of uterus, unspecified: Secondary | ICD-10-CM | POA: Insufficient documentation

## 2014-05-25 DIAGNOSIS — N840 Polyp of corpus uteri: Secondary | ICD-10-CM | POA: Insufficient documentation

## 2014-05-25 MED ORDER — ONETOUCH ULTRA 2 W/DEVICE KIT: PACK | Status: DC

## 2014-05-25 NOTE — Addendum Note (Signed)
Addended by: Emmaline Kluver on: 05/25/2014 04:26 PM   Modules accepted: Orders

## 2014-05-25 NOTE — Telephone Encounter (Signed)
Called pt to discuss results. Will place referral to [place] (pt last seen by Dr. Ihor Dow in 2013) for further work-up / recommendations. Pt reports she lost her blood glucose meter and requests new Rx; will send in, today. Pt reports continued hypoglycemic episodes with glipizide XR 2.5 mg, so she is back to taking half of one 5 mg IR tablet once per day. Will plan to f/u at next DM check-up. --CMS

## 2014-05-27 NOTE — Telephone Encounter (Addendum)
Pharmacy contacted. They need an Rx from a PECOS-certified MD. New Rx faxed signed by Dr. Erin Hearing. Message left on pt's voice mail. Thanks. --CMS

## 2014-05-27 NOTE — Telephone Encounter (Signed)
Pharmacy is [facility].

## 2014-05-27 NOTE — Telephone Encounter (Signed)
Pt calls, pharmacy contacted her and stated that there was an error when glucometer kit was sent to pharmacy. Please call

## 2014-05-31 ENCOUNTER — Telehealth: Payer: Self-pay | Admitting: Family Medicine

## 2014-05-31 DIAGNOSIS — E119 Type 2 diabetes mellitus without complications: Secondary | ICD-10-CM

## 2014-05-31 NOTE — Telephone Encounter (Signed)
Pt called and said that the pharmacy can not fill her prescription for her meter until they have the diagnose code.  Please call [facility] so that the pt can get her meter. jw

## 2014-05-31 NOTE — Telephone Encounter (Signed)
Diagnosis code E11.9 called into pharmacy.

## 2014-06-03 MED ORDER — ONETOUCH ULTRA 2 W/DEVICE KIT: PACK | Status: DC

## 2014-06-03 NOTE — Telephone Encounter (Signed)
Pharmacy claims they never received DX code. Please call it in again.

## 2014-06-03 NOTE — Telephone Encounter (Signed)
Rx re-sent electronically with dx code included in the sig.

## 2014-06-03 NOTE — Addendum Note (Signed)
Addended by: Levert Feinstein F on: 06/03/2014 12:08 PM   Modules accepted: Orders

## 2014-06-08 ENCOUNTER — Telehealth: Payer: Self-pay | Admitting: Family Medicine

## 2014-06-08 NOTE — Telephone Encounter (Signed)
Wants to know what is being done about polyps found in vagina Please advise

## 2014-06-09 NOTE — Telephone Encounter (Signed)
Unsure exactly what pt is asking; I communicated to her that fibroids and a possible polyp were seen on her ultrasound and that a referral to OBGYN has been made. No appointment yet scheduled in [facility].  Called pt to let her know this and left a message; message stated that OBGYN referral has been made and that she should be contacted with an appointment, but she can call [facility] at [phone] and ask for the outpt clinic to clarify. Will also route to referral coordinators here for clarification.  Thanks! --CMS

## 2014-06-14 ENCOUNTER — Telehealth: Payer: Self-pay | Admitting: Family Medicine

## 2014-06-14 NOTE — Telephone Encounter (Signed)
Received fax from [facility] requesting new Rx for diabetic shoes to be signed by [facility] certified physician. Form completed (qualifying Dx E11.9, controlled type 2 DM and L84, callus of foot) and copy of supporting office visits made. Rx form completed after hours. Will request Dr. Lacinda Axon get form signed by attending MD and fax tomorrow (form left in Dr. Jonathon Jordan box (he is covering for me 11/25, as I will be out of town). Thanks for assistance. --CMS

## 2014-07-05 ENCOUNTER — Telehealth: Payer: Self-pay | Admitting: Family Medicine

## 2014-07-05 MED ORDER — GLIPIZIDE ER 2.5 MG PO TB24: 2.5000 mg | ORAL_TABLET | Freq: Every day | ORAL | Status: DC

## 2014-07-05 MED ORDER — GLUCOSE BLOOD VI STRP: ORAL_STRIP | Status: DC

## 2014-07-05 NOTE — Telephone Encounter (Signed)
Rx's sent to [facility]. Thanks. --CMS

## 2014-07-05 NOTE — Telephone Encounter (Signed)
Needs referral to [facility]. I think it is for her Jan 11 visit Also needs refill on glipzide and her strips at [facility] on [address]

## 2014-07-05 NOTE — Telephone Encounter (Signed)
Patient has already been referred to [facility], will forward to MD for refill request.

## 2014-07-11 ENCOUNTER — Other Ambulatory Visit: Payer: Self-pay | Admitting: *Deleted

## 2014-07-11 MED ORDER — GLIPIZIDE 5 MG PO TABS: 5.0000 mg | ORAL_TABLET | Freq: Every day | ORAL | Status: DC

## 2014-07-11 NOTE — Telephone Encounter (Signed)
Pt informed. Deseree Blount, CMA  

## 2014-07-11 NOTE — Telephone Encounter (Signed)
Pt states that her and Dr. Venetia Maxon discussed changing her glipizide back to 5mg  because the 2.5mg  were not controlling her blood sugars.  Stated that she is out of medication now, but the 2.5 was called in on 07/05/14.  Advised would send message to Dr. Scarlette Calico nurse. Maxson Oddo, Salome Spotted

## 2014-07-11 NOTE — Telephone Encounter (Signed)
Rx for glipizide 5 mg tablets sent in, today. Please let pt know. Thanks and sorry for the confusion! --CMS

## 2014-08-01 ENCOUNTER — Encounter: Payer: Self-pay | Admitting: Obstetrics & Gynecology

## 2014-08-01 ENCOUNTER — Ambulatory Visit (INDEPENDENT_AMBULATORY_CARE_PROVIDER_SITE_OTHER): Payer: Medicare Other | Admitting: Obstetrics & Gynecology

## 2014-08-01 VITALS — BP 135/83 | HR 70 | Ht 64.0 in | Wt 179.0 lb

## 2014-08-01 DIAGNOSIS — N840 Polyp of corpus uteri: Secondary | ICD-10-CM | POA: Diagnosis not present

## 2014-08-01 MED ORDER — MEGESTROL ACETATE 40 MG PO TABS: 40.0000 mg | ORAL_TABLET | Freq: Every day | ORAL | Status: DC

## 2014-08-01 NOTE — Progress Notes (Signed)
Periods are irregular, sometimes skips a month here and there.

## 2014-08-01 NOTE — Patient Instructions (Signed)
Dysfunctional Uterine Bleeding Normally, menstrual periods begin between ages 11 to 17 in young women. A normal menstrual cycle/period may begin every 23 days up to 35 days and lasts from 1 to 7 days. Around 12 to 14 days before your menstrual period starts, ovulation (ovary produces an egg) occurs. When counting the time between menstrual periods, count from the first day of bleeding of the previous period to the first day of bleeding of the next period. Dysfunctional (abnormal) uterine bleeding is bleeding that is different from a normal menstrual period. Your periods may come earlier or later than usual. They may be lighter, have blood clots or be heavier. You may have bleeding between periods, or you may skip one period or more. You may have bleeding after sexual intercourse, bleeding after menopause, or no menstrual period. CAUSES   Pregnancy (normal, miscarriage, tubal).  IUDs (intrauterine device, birth control).  Birth control pills.  Hormone treatment.  Menopause.  Infection of the cervix.  Blood clotting problems.  Infection of the inside lining of the uterus.  Endometriosis, inside lining of the uterus growing in the pelvis and other female organs.  Adhesions (scar tissue) inside the uterus.  Obesity or severe weight loss.  Uterine polyps inside the uterus.  Cancer of the vagina, cervix, or uterus.  Ovarian cysts or polycystic ovary syndrome.  Medical problems (diabetes, thyroid disease).  Uterine fibroids (noncancerous tumor).  Problems with your female hormones.  Endometrial hyperplasia, very thick lining and enlarged cells inside of the uterus.  Medicines that interfere with ovulation.  Radiation to the pelvis or abdomen.  Chemotherapy. DIAGNOSIS   Your doctor will discuss the history of your menstrual periods, medicines you are taking, changes in your weight, stress in your life, and any medical problems you may have.  Your doctor will do a physical  and pelvic examination.  Your doctor may want to perform certain tests to make a diagnosis, such as:  Pap test.  Blood tests.  Cultures for infection.  CT scan.  Ultrasound.  Hysteroscopy.  Laparoscopy.  MRI.  Hysterosalpingography.  D and C.  Endometrial biopsy. TREATMENT  Treatment will depend on the cause of the dysfunctional uterine bleeding (DUB). Treatment may include:  Observing your menstrual periods for a couple of months.  Prescribing medicines for medical problems, including:  Antibiotics.  Hormones.  Birth control pills.  Removing an IUD (intrauterine device, birth control).  Surgery:  D and C (scrape and remove tissue from inside the uterus).  Laparoscopy (examine inside the abdomen with a lighted tube).  Uterine ablation (destroy lining of the uterus with electrical current, laser, heat, or freezing).  Hysteroscopy (examine cervix and uterus with a lighted tube).  Hysterectomy (remove the uterus). HOME CARE INSTRUCTIONS   If medicines were prescribed, take exactly as directed. Do not change or switch medicines without consulting your caregiver.  Long term heavy bleeding may result in iron deficiency. Your caregiver may have prescribed iron pills. They help replace the iron that your body lost from heavy bleeding. Take exactly as directed.  Do not take aspirin or medicines that contain aspirin one week before or during your menstrual period. Aspirin may make the bleeding worse.  If you need to change your sanitary pad or tampon more than once every 2 hours, stay in bed with your feet elevated and a cold pack on your lower abdomen. Rest as much as possible, until the bleeding stops or slows down.  Eat well-balanced meals. Eat foods high in iron. Examples   are:  Leafy green vegetables.  Whole-grain breads and cereals.  Eggs.  Meat.  Liver.  Do not try to lose weight until the abnormal bleeding has stopped and your blood iron level is  back to normal. Do not lift more than ten pounds or do strenuous activities when you are bleeding.  For a couple of months, make note on your calendar, marking the start and ending of your period, and the type of bleeding (light, medium, heavy, spotting, clots or missed periods). This is for your caregiver to better evaluate your problem. SEEK MEDICAL CARE IF:   You develop nausea (feeling sick to your stomach) and vomiting, dizziness, or diarrhea while you are taking your medicine.  You are getting lightheaded or weak.  You have any problems that may be related to the medicine you are taking.  You develop pain with your DUB.  You want to remove your IUD.  You want to stop or change your birth control pills or hormones.  You have any type of abnormal bleeding mentioned above.  You are over 16 years old and have not had a menstrual period yet.  You are 44 years old and you are still having menstrual periods.  You have any of the symptoms mentioned above.  You develop a rash. SEEK IMMEDIATE MEDICAL CARE IF:   An oral temperature above 102 F (38.9 C) develops.  You develop chills.  You are changing your sanitary pad or tampon more than once an hour.  You develop abdominal pain.  You pass out or faint. Document Released: 07/05/2000 Document Revised: 09/30/2011 Document Reviewed: 06/06/2009 ExitCare Patient Information 2015 [employer]. This information is not intended to replace advice given to you by your health care provider. Make sure you discuss any questions you have with your health care provider.  

## 2014-08-01 NOTE — Progress Notes (Signed)
Subjective:     Patient ID: Cathy Bond, female   DOB: Dec 04, 1970, 44 y.o.   MRN: [id number]  HPI Pt reports that she was having some pelvic pain that was mild and was found to have possible uterine polyps via sono.  Pt reports that she was amenorrheic for 'a long time' but, now she has menses monthly lasting 5 days.  She denies bleeding between menses.  She reports occ heavy menses but, other cycles are very light.  The cycle length is never longer than 5 days.  Menses are occ painful.  No meds taken.  She c/o occ pelvic pain outside of her menses which is not present currently.   Review of Systems     Objective:   Physical Exam BP 135/83 mmHg  Pulse 70  Ht 5\' 4"  (1.626 m)  Wt 179 lb (81.194 kg)  BMI 30.71 kg/m2  LMP 07/12/2014 (Approximate) Pt in NAD Abd; soft, NT; ND GU: EGBUS: no lesions Vagina: no blood in vault Cervix: no lesion; no mucopurulent d/c Uterus: small, mobile Adnexa: no masses; sl tender       Nov 2015 CLINICAL DATA: Right lower quadrant pain, amenorrhea x3 months, negative endometrial biopsy in 2013,  EXAM: TRANSABDOMINAL AND TRANSVAGINAL ULTRASOUND OF PELVIS  TECHNIQUE: Both transabdominal and transvaginal ultrasound examinations of the pelvis were performed. Transabdominal technique was performed for global imaging of the pelvis including uterus, ovaries, adnexal regions, and pelvic cul-de-sac. It was necessary to proceed with endovaginal exam following the transabdominal exam to visualize the endometrium.  COMPARISON: 02/29/2012  FINDINGS: Uterus  Measurements: 8.9 x 4.9 x 5.3 cm. Suspected 9 x 5 x 9 mm intramural fibroid in the posterior uterine fundus. Additional possible 6 x 4 x 7 mm intramural fibroid in the anterior uterine fundus.  Endometrium  Thickness: 7 mm. Suspected 7 x 4 x 7 mm focal polyp in the uterine fundus (image 38), with associated focal color Doppler flow.  Right ovary  Measurements: 2.5 x 1.4 x 2.4 cm.  Normal appearance/no adnexal mass.  Left ovary  Measurements: 2.7 x 1.4 x 2.5 cm. Normal appearance/no adnexal mass.  Other findings  Trace pelvic fluid.  IMPRESSION: Suspected 7 mm endometrial polyp in the uterine fundus.  Small uterine fibroids measuring up to 9 mm.  Consider further evaluation with sonohysterogram for confirmation prior to hysteroscopy. Endometrial sampling should also be considered if patient is at high risk for endometrial carcinoma. (Ref: Radiological Reasoning: Algorithmic Workup of Abnormal Vaginal Bleeding with Endovaginal Sonography and Sonohysterography. AJR 2008; [id number]:[id number]).  Assessment:     Heavy menstrual bleeding possibly due to endometrial polyps   Pelvic pain of unknown etiology- not present currently    Plan:     Megace 40 mg daily for 3 months to treat polyps.  If this does not work will consider a hysteroscopic polypectomy F/u in 3 months or sooner prn

## 2014-08-15 ENCOUNTER — Telehealth: Payer: Self-pay | Admitting: Family Medicine

## 2014-08-15 NOTE — Telephone Encounter (Signed)
Received fax from [facility] audit services regarding pt's Rx for diabetes test strips. Per audit specialist, pt's Rx from Dr. Erin Hearing is fine but they need an accompanying progress note signed by Dr. Erin Hearing within the "service date" of 06/04/14; such a progress note would have to be received by them by 08/22/14. Dr. Erin Hearing is out of town currently, and another attending cannot cosign a progress note to match his attending-signed Rx. If a new Rx is sent in from a different attending, it would require a progress note signed / cosigned by that attending, and it would start a new approval process rather than completing the current one.  Per audit specialist, having pt come back for another appointment with me, then having a new Rx signed by an attending with my progress note from that visit cosigned by that same attending should work.  Called pt and left message instructing her to follow up with me for a diabetes visit. Will plan at that time to get a new Rx and progress note cosigned by the same attending to fulfill their requirements. Will f/u with pt as needed, otherwise. --CMS

## 2014-10-26 ENCOUNTER — Telehealth: Payer: Self-pay | Admitting: Family Medicine

## 2014-10-26 ENCOUNTER — Encounter: Payer: Self-pay | Admitting: Family Medicine

## 2014-10-26 ENCOUNTER — Telehealth: Payer: Self-pay | Admitting: *Deleted

## 2014-10-26 ENCOUNTER — Other Ambulatory Visit (HOSPITAL_COMMUNITY)
Admission: RE | Admit: 2014-10-26 | Discharge: 2014-10-26 | Disposition: A | Discharge status: Home / Self Care | Payer: Medicare Other | Source: Ambulatory Visit | Attending: Family Medicine | Admitting: Family Medicine

## 2014-10-26 ENCOUNTER — Ambulatory Visit (INDEPENDENT_AMBULATORY_CARE_PROVIDER_SITE_OTHER): Payer: Medicare Other | Admitting: Family Medicine

## 2014-10-26 VITALS — BP 146/114 | HR 63 | Temp 98.2°F | Wt 182.0 lb

## 2014-10-26 DIAGNOSIS — Z23 Encounter for immunization: Secondary | ICD-10-CM

## 2014-10-26 DIAGNOSIS — R87619 Unspecified abnormal cytological findings in specimens from cervix uteri: Secondary | ICD-10-CM | POA: Insufficient documentation

## 2014-10-26 DIAGNOSIS — Z124 Encounter for screening for malignant neoplasm of cervix: Secondary | ICD-10-CM | POA: Diagnosis not present

## 2014-10-26 DIAGNOSIS — Z01419 Encounter for gynecological examination (general) (routine) without abnormal findings: Secondary | ICD-10-CM | POA: Diagnosis not present

## 2014-10-26 DIAGNOSIS — Z1151 Encounter for screening for human papillomavirus (HPV): Secondary | ICD-10-CM | POA: Diagnosis not present

## 2014-10-26 DIAGNOSIS — E119 Type 2 diabetes mellitus without complications: Secondary | ICD-10-CM

## 2014-10-26 DIAGNOSIS — I10 Essential (primary) hypertension: Secondary | ICD-10-CM | POA: Diagnosis not present

## 2014-10-26 LAB — POCT GLYCOSYLATED HEMOGLOBIN (HGB A1C): HEMOGLOBIN A1C: 7.6

## 2014-10-26 LAB — COMPREHENSIVE METABOLIC PANEL
ALBUMIN: 4.2 g/dL (ref 3.5–5.2)
ALT: <8 U/L (ref 0–35)
AST: 10 U/L (ref 0–37)
Alkaline Phosphatase: 63 U/L (ref 39–117)
BUN: 13 mg/dL (ref 6–23)
CALCIUM: 9.1 mg/dL (ref 8.4–10.5)
CO2: 27 meq/L (ref 19–32)
CREATININE: 0.8 mg/dL (ref 0.50–1.10)
Chloride: 102 mEq/L (ref 96–112)
Glucose, Bld: 124 mg/dL — ABNORMAL HIGH (ref 70–99)
POTASSIUM: 4.2 meq/L (ref 3.5–5.3)
SODIUM: 135 meq/L (ref 135–145)
Total Bilirubin: 0.4 mg/dL (ref 0.2–1.2)
Total Protein: 7.3 g/dL (ref 6.0–8.3)

## 2014-10-26 LAB — LIPID PANEL
Cholesterol: 128 mg/dL (ref 0–200)
HDL: 31 mg/dL — ABNORMAL LOW (ref >=46)
LDL CALC: 83 mg/dL (ref 0–99)
Total CHOL/HDL Ratio: 4.1 Ratio
Triglycerides: 71 mg/dL (ref <150)
VLDL: 14 mg/dL (ref 0–40)

## 2014-10-26 MED ORDER — GLIPIZIDE 5 MG PO TABS: 5.0000 mg | ORAL_TABLET | Freq: Every day | ORAL | Status: DC

## 2014-10-26 MED ORDER — TETANUS-DIPHTH-ACELL PERTUSSIS 5-2.5-18.5 LF-MCG/0.5 IM SUSP: 0.5000 mL | Freq: Once | INTRAMUSCULAR | Status: DC

## 2014-10-26 MED ORDER — LOSARTAN POTASSIUM 50 MG PO TABS: 50.0000 mg | ORAL_TABLET | Freq: Every day | ORAL | Status: DC

## 2014-10-26 MED ORDER — TETANUS-DIPHTH-ACELL PERTUSSIS 5-2-15.5 LF-MCG/0.5 IM SUSP: 0.5000 mL | Freq: Once | INTRAMUSCULAR | Status: DC

## 2014-10-26 MED ORDER — GLUCOSE BLOOD VI STRP: ORAL_STRIP | Status: DC

## 2014-10-26 NOTE — Patient Instructions (Signed)
Front Desk: Please schedule pt a lab appointment in about 2 weeks and with me in about 4-6 weeks. Thanks. --CMS  Thank you for coming in, today!  Everything looks okay today. Your blood pressure is high, so I want to start you on a medication called losartan. It will help control your blood pressure and it will help protect your kidneys since you are diabetic.  I wouldn't change any other medicines, for now. Keep taking your diabetes medications. Check your sugar daily. Let me know if there are any problems with anything. Follow up with OBGYN when you're done with your medicine.  Come back for a lab visit in about 2 weeks to recheck your kidney function after starting your blood pressure medicine. Come back to see me in about 4-6 weeks to recheck your blood pressure.  My last day is June 30th. After that, you'll have a different doctor here in this same building. Please feel free to call with any questions or concerns at any time, at [phone]. --Dr. Venetia Maxon

## 2014-10-26 NOTE — Telephone Encounter (Signed)
Boostrix Rx sent in. Thanks. --CMS

## 2014-10-26 NOTE — Telephone Encounter (Signed)
Received a message from [facility] stating they don't carry the Adacel Tdap. They have Boostrix in stock. Please send new Rx for Boostrix Tdap.  Call with questions at [phone]. Derl Barrow, RN

## 2014-10-26 NOTE — Telephone Encounter (Signed)
I got a message from [place] that patient's script for one touch test strip was not approved and needed to be changed to Accu check.   I spoke with pharmacy.Patient's One touch script went through and she already picked it up, hence she does not need prior authorization or change of brand. No new prescription will be written at this time. Please contact PCP if patient need anything else.

## 2014-10-26 NOTE — Addendum Note (Signed)
Addended by: Emmaline Kluver on: 10/26/2014 02:52 PM   Modules accepted: Orders

## 2014-10-26 NOTE — Progress Notes (Signed)
Subjective:    Patient ID: Cathy Bond, female    DOB: 02-Aug-1970, 44 y.o.   MRN: [id number]  HPI: Pt presents to clinic for her annual physical exam. She reports she has gained weight from "not watching her diet and exercise, while it's been cold" (i.e., over the winter). She has had some blurry vision and is due to see her eye doctor (last saw them about a year ago). She has some on-and-off back pain but has no other symptoms with it and has not tried any OTC meds. She has an endometrial polyp that she is taking Megace for, and will f/u with OBGYN once she is done with it. She is due for a Pap smear. She is also due for Tdap immunization. Pt is a former smoker. She quit about 9 months ago.  Family History  Problem Relation Age of Onset  . Diabetes Mother   . Stroke Mother   . Depression Sister   . Alcohol abuse Sister   . Diabetes Sister   . Depression Brother   . Diabetes Brother     Past Medical History  Diagnosis Date  . Seasonal allergies   . Depression   . Anxiety   . BV (bacterial vaginosis)   . Trichomonas   . Yeast vaginitis   . Diabetes mellitus     Past Surgical History  Procedure Laterality Date  . Tubal ligation    . Toe nail    . Endometrial biopsy  2 in 2013    negative    History   Social History  . Marital Status: Single    Spouse Name: N/A  . Number of Children: N/A  . Years of Education: N/A   Occupational History  . Not on file.   Social History Main Topics  . Smoking status: Former Smoker -- 2.00 packs/day for 6 years    Types: Cigarettes    Quit date: 01/19/2014  . Smokeless tobacco: Never Used     Comment: used patches before  . Alcohol Use: No  . Drug Use: No     Comment: No hx of IV use  . Sexual Activity: Yes    Birth Control/ Protection: Surgical   Other Topics Concern  . Not on file   Social History Narrative   Has moved in with daughter.  Unemployed - Disability since age 36 unknown reason.  10th grade education.           In addition to the above documentation, pt's PMH, surgical history, FH, and SH all reviewed and updated where appropriate in the EMR. I have also reviewed and updated the pt's allergies and current medications as appropriate.  Review of Systems: As above. Otherwise, full 12-system ROS was reviewed and all negative.     Objective:   Physical Exam BP 146/114 mmHg  Pulse 63  Temp(Src) 98.2 F (36.8 C) (Oral)  Wt 182 lb (82.555 kg) Manual recheck BP 138/96 Gen: well-appearing adult female in NAD HEENT: [place]/AT, sclerae/conjunctivae clear, no lid lag, EOMI, PERRLA   MMM, posterior oropharynx clear, no cervical lymphadenopathy  neck supple with full ROM, no masses appreciated; thyroid not enlarged  Cardio: RRR, no murmur appreciated; distal pulses intact/symmetric Pulm: CTAB, no wheezes, normal WOB  Abd: soft, nondistended, BS+, no HSM GU: normal external vaginal / vulvar structures; speculum exam unremarkable other than small amount of white physiologic discharge in vaginal vault Ext: warm/well-perfused, no cyanosis/clubbing/edema MSK: strength 5/5 in all four extremities, no frank joint deformity/effusion  normal ROM to all four extremities with no point muscle/bony tenderness in spine Neuro/Psych: alert/oriented, sensation grossly intact; normal gait/balance  mood reported euthymic with congruent affect Diabetic foot exam: unremarkable; skin intact, pulses present, sensation grossly intact; see EPIC flowsheet     Assessment & Plan:  44yo female with DM and now persistently elevated BP in context of weight gain with poor compliance with diet / exercise measures for a few months - refilled CBG strips and glipizide, today - new Rx for losartan for HTN and kidney protection in context of DM - baseline CMP check today for kidney and liver function - will need recheck Cr in about 2 weeks and f/u for HTN specifically in 1-2 months  Anticipatory guidance / Risk factor reduction -  counseled on maintenance of healthy weight, especially for help with controlling DM and HTN without medications - counseled on regular f/u with PCP and specialist care, especially ophthalmology in diabetes  Immunization / screening / ancillary studies  - Pneumococcal immunization given today in clinic; Rx for Tdap sent to pharmacy (see phone note) - CMP and lipid panel drawn, today - Pap smear performed today - instructed to f/u with ophthalmology as above - not yet due for colonoscopy, otherwise up to date  F/u in 1 year for wellness visit, in about 4-6 weeks for BP recheck, or otherwise as needed. F/u with OBGYN as instructed.  Emmaline Kluver, MD PGY-3, [facility] 10/26/2014, 6:02 PM

## 2014-10-27 DIAGNOSIS — Z23 Encounter for immunization: Secondary | ICD-10-CM | POA: Diagnosis not present

## 2014-10-27 DIAGNOSIS — E119 Type 2 diabetes mellitus without complications: Secondary | ICD-10-CM | POA: Diagnosis present

## 2014-10-27 NOTE — Progress Notes (Signed)
One of the available clinic preceptor.

## 2014-10-28 LAB — CYTOLOGY - PAP

## 2014-10-31 ENCOUNTER — Encounter: Payer: Self-pay | Admitting: Family Medicine

## 2014-12-05 ENCOUNTER — Telehealth: Payer: Self-pay | Admitting: *Deleted

## 2014-12-05 NOTE — Telephone Encounter (Signed)
Received a fax from [facility] requesting test to Accu-chek products due to insurance will not cover One Touch products.  Pt has medicare.  Please advise.  Derl Barrow, RN

## 2014-12-06 NOTE — Telephone Encounter (Signed)
New Rx's handwritten and faxed to [facility] this morning. Oversigned for Medicare by Dr. Andria Frames (NPI #[id number]). Thanks! --CMS

## 2015-03-01 ENCOUNTER — Other Ambulatory Visit: Payer: Self-pay | Admitting: Family Medicine

## 2015-03-06 ENCOUNTER — Other Ambulatory Visit: Payer: Self-pay | Admitting: Family Medicine

## 2015-03-06 NOTE — Telephone Encounter (Signed)
Rx refilled x 1 month. Please have pt come in for an appt as it appears she's several months late for blood work to monitor her kidney function.   Thanks, Archie Patten, MD [facility] Resident  03/06/2015, 1:38 PM

## 2015-03-07 NOTE — Telephone Encounter (Signed)
Appointment scheduled for 9/9 with PCP.

## 2015-03-31 ENCOUNTER — Other Ambulatory Visit (HOSPITAL_COMMUNITY)
Admission: RE | Admit: 2015-03-31 | Discharge: 2015-03-31 | Disposition: A | Discharge status: Home / Self Care | Payer: Medicare Other | Source: Ambulatory Visit | Attending: Family Medicine | Admitting: Family Medicine

## 2015-03-31 ENCOUNTER — Ambulatory Visit (INDEPENDENT_AMBULATORY_CARE_PROVIDER_SITE_OTHER): Payer: Medicare Other | Admitting: Family Medicine

## 2015-03-31 ENCOUNTER — Encounter: Payer: Self-pay | Admitting: Family Medicine

## 2015-03-31 VITALS — BP 118/70 | HR 86 | Temp 98.0°F | Ht 64.0 in | Wt 179.8 lb

## 2015-03-31 DIAGNOSIS — N898 Other specified noninflammatory disorders of vagina: Secondary | ICD-10-CM

## 2015-03-31 DIAGNOSIS — Z113 Encounter for screening for infections with a predominantly sexual mode of transmission: Secondary | ICD-10-CM

## 2015-03-31 DIAGNOSIS — Z114 Encounter for screening for human immunodeficiency virus [HIV]: Secondary | ICD-10-CM | POA: Diagnosis not present

## 2015-03-31 DIAGNOSIS — B9689 Other specified bacterial agents as the cause of diseases classified elsewhere: Secondary | ICD-10-CM

## 2015-03-31 DIAGNOSIS — E119 Type 2 diabetes mellitus without complications: Secondary | ICD-10-CM

## 2015-03-31 DIAGNOSIS — Z7251 High risk heterosexual behavior: Secondary | ICD-10-CM | POA: Diagnosis not present

## 2015-03-31 DIAGNOSIS — N760 Acute vaginitis: Secondary | ICD-10-CM

## 2015-03-31 DIAGNOSIS — I10 Essential (primary) hypertension: Secondary | ICD-10-CM | POA: Diagnosis not present

## 2015-03-31 DIAGNOSIS — A499 Bacterial infection, unspecified: Secondary | ICD-10-CM

## 2015-03-31 DIAGNOSIS — Z1159 Encounter for screening for other viral diseases: Secondary | ICD-10-CM | POA: Diagnosis not present

## 2015-03-31 LAB — POCT WET PREP (WET MOUNT): Clue Cells Wet Prep Whiff POC: POSITIVE

## 2015-03-31 MED ORDER — METRONIDAZOLE 500 MG PO TABS: 500.0000 mg | ORAL_TABLET | Freq: Three times a day (TID) | ORAL | Status: DC

## 2015-03-31 NOTE — Assessment & Plan Note (Signed)
Unable to obtain an A1c today due to issues with the machine. Last A1c 7.6. Discussed if the patient notes any more hypoglycemic events, she should call the office so we can consider change in regimen. - continue glipizide 2.5mg  24hr tablet. - continue to check CBGs daily  - discussed symptoms of hypoglycemia  - f/u in 3 months or sooner as needed.

## 2015-03-31 NOTE — Assessment & Plan Note (Signed)
BP well controlled today. Patient never returned in April with the start of losartan. No side effects or evidence of hypotension - continue losartan  - BMET today

## 2015-03-31 NOTE — Progress Notes (Signed)
Patient ID: Cathy Bond, female   DOB: 02/18/1971, 44 y.o.   MRN: [id number]    Subjective: CC: f/u DM and HTN HPI: Patient is a 44 y.o. female with a past medical history of HTN, T2DM presenting to clinic today for a f/u on DM with concerns of STD exposure.  Concerns for STDs Patient recently separated from her female partner of approximately 21 years. She sates they occasionally have unprotected intercourse. She states he tells her he is not having intercourse with other people but she is doubtful. She's noted vaginal pruritus since having intercourse with him. Stable vaginal discharge that is not malodorous. No vulvar lesions. Endorses some pelvic pain as well as dysparuenia.   Diabetes:  Check CBGs daily: Lowest was 53 (after taking medication and eating) and highest was 186. She felt dizzy at 53. She notes having 2 readings <70. Normally CBGs are in the 100s.  Taking medications: glipizide  Side effects: None besides 1 episode of hypoglycemia  ROS: denies fever, chills, dizziness, LOC, polyuria, polydipsia, numbness or tingling in extremities or chest pain. Last eye exam: Had it 2 years ago (went to the diabetic [facility]) Last foot exam: 10/2014 Last A1c: 7.6 in 10/2014 Last flu, zoster and/or pneumovax: due to flu shot She drinks 3- 20oz pepsis a day   Social History: 2ppd (started back smoking 2 weeks ago). The patient was counseled on the dangers of tobacco use, and was advised to quit.  Reviewed strategies to maximize success, including stress management, support of family/friends and written materials.   Health Maintenance: due for eye exam and flu vaccine  ROS: All other systems reviewed and are negative.  Past Medical History Patient Active Problem List   Diagnosis Date Noted  . Uterine fibroid 05/25/2014  . Endometrial polyp 05/25/2014  . Left knee pain 05/20/2014  . Amenorrhea 05/17/2014  . Foot callus 02/24/2014  . Bacterial vaginosis 10/11/2013  . Right knee  pain 10/11/2013  . Viral URI with cough 07/05/2013  . Pelvic pain 03/31/2013  . Breast tenderness 03/31/2013  . Screen for sexually transmitted diseases 03/31/2013  . Lower abdominal pain 01/11/2013  . Lump of right breast 09/10/2012  . HTN (hypertension) 09/08/2012  . Midsternal chest pain 09/07/2012  . Weight loss, unintentional 05/03/2012  . Abdominal pain 05/03/2012  . Abnormal vaginal bleeding 12/27/2011  . Menorrhagia 12/27/2011  . Diabetes type 2, controlled 12/09/2011  . Acid reflux 11/19/2011  . Left facial numbness 09/23/2011  . Low back pain 08/23/2011  . Depression 08/08/2011  . Bipolar disorder 02/28/2011  . Fatigue 02/26/2011  . Allergic rhinitis due to pollen 10/18/2010  . OBESITY 10/23/2009    Medications- reviewed and updated Current Outpatient Prescriptions  Medication Sig Dispense Refill  . aspirin EC 81 MG EC tablet Take 1 tablet (81 mg total) by mouth daily. 90 tablet 3  . EPINEPHrine (EPIPEN) 0.3 mg/0.3 mL DEVI Inject 0.3 mLs (0.3 mg total) into the muscle as needed. 1 Device 1  . glipiZIDE (GLUCOTROL) 5 MG tablet TAKE 1 TABLET BY MOUTH DAILY 90 tablet 0  . losartan (COZAAR) 50 MG tablet Take 1 tablet (50 mg total) by mouth daily. 90 tablet 3  . megestrol (MEGACE) 40 MG tablet Take 1 tablet (40 mg total) by mouth daily. 30 tablet 3  . Multiple Vitamin (MULITIVITAMIN WITH MINERALS) TABS Take 1 tablet by mouth daily.    . Multiple Vitamins-Minerals (HAIR/SKIN/NAILS PO) Take 2 tablets by mouth 2 (two) times daily.    Marland Kitchen  nitroGLYCERIN (NITROSTAT) 0.3 MG SL tablet Place 1 tablet (0.3 mg total) under the tongue every 5 (five) minutes as needed for chest pain. 10 tablet 0  . metroNIDAZOLE (FLAGYL) 500 MG tablet Take 1 tablet (500 mg total) by mouth 3 (three) times daily. 14 tablet 0   No current facility-administered medications for this visit.    Objective: Office vital signs reviewed. BP 118/70 mmHg  Pulse 86  Temp(Src) 98 F (36.7 C) (Oral)  Ht 5\' 4"   (1.626 m)  Wt 179 lb 12.8 oz (81.557 kg)  BMI 30.85 kg/m2  LMP 12/21/2014 (Approximate)   Physical Examination:  General: Awake, alert, well- nourished, NAD Cardio: RRR, no m/r/g noted. No pitting edema noted Pulm: No increased WOB.  CTAB, without wheezes, rhonchi or crackles noted.  GI: soft, NT/ND,+BS x4 GYN:  External genitalia within normal limits.  Vaginal mucosa pink, moist, normal rugae.  Nonfriable cervix without lesions, no bleeding noted on speculum exam. Moderate amount of thin, gray malodorous discharge   Bimanual exam revealed normal, nongravid uterus.  No cervical motion tenderness. No adnexal masses bilaterally.    Assessment/Plan: Screen for sexually transmitted diseases Checked HIV, RPR, Gc/Chlamydia, and wet prep. Discussed with patient that there is a slight chance that Medicare may not cover HIV testing (however hopefully Medicaid will cover what is not covered). Wet prep +BV.     Bacterial vaginosis Gray malodorous discharge with clue cells and positive whiff test.  Rx for Flagyl 500mg  BID x 7 days- patient states she's taken this and has tolerated it in the past. Discussed Disulfiram reaction   HTN (hypertension) BP well controlled today. Patient never returned in April with the start of losartan. No side effects or evidence of hypotension - continue losartan  - BMET today  Diabetes type 2, controlled Unable to obtain an A1c today due to issues with the machine. Last A1c 7.6. Discussed if the patient notes any more hypoglycemic events, she should call the office so we can consider change in regimen. - continue glipizide 2.5mg  24hr tablet. - continue to check CBGs daily  - discussed symptoms of hypoglycemia  - f/u in 3 months or sooner as needed.     Orders Placed This Encounter  Procedures  . RPR  . Basic Metabolic Panel  . HIV antibody (with reflex)  . POCT A1C  . POCT Wet Prep [facility])    Meds ordered this encounter  Medications  .  metroNIDAZOLE (FLAGYL) 500 MG tablet    Sig: Take 1 tablet (500 mg total) by mouth 3 (three) times daily.    Dispense:  14 tablet    Refill:  [place] PGY-2, [place]

## 2015-03-31 NOTE — Patient Instructions (Signed)
Bacterial Vaginosis Bacterial vaginosis is a vaginal infection that occurs when the normal balance of bacteria in the vagina is disrupted. It results from an overgrowth of certain bacteria. This is the most common vaginal infection in women of childbearing age. Treatment is important to prevent complications, especially in pregnant women, as it can cause a premature delivery. CAUSES  Bacterial vaginosis is caused by an increase in harmful bacteria that are normally present in smaller amounts in the vagina. Several different kinds of bacteria can cause bacterial vaginosis. However, the reason that the condition develops is not fully understood. RISK FACTORS Certain activities or behaviors can put you at an increased risk of developing bacterial vaginosis, including:  Having a new sex partner or multiple sex partners.  Douching.  Using an intrauterine device (IUD) for contraception. Women do not get bacterial vaginosis from toilet seats, bedding, swimming pools, or contact with objects around them. SIGNS AND SYMPTOMS  Some women with bacterial vaginosis have no signs or symptoms. Common symptoms include:  Grey vaginal discharge.  A fishlike odor with discharge, especially after sexual intercourse.  Itching or burning of the vagina and vulva.  Burning or pain with urination. DIAGNOSIS  Your health care provider will take a medical history and examine the vagina for signs of bacterial vaginosis. A sample of vaginal fluid may be taken. Your health care provider will look at this sample under a microscope to check for bacteria and abnormal cells. A vaginal pH test may also be done.  TREATMENT  Bacterial vaginosis may be treated with antibiotic medicines. These may be given in the form of a pill or a vaginal cream. A second round of antibiotics may be prescribed if the condition comes back after treatment.  HOME CARE INSTRUCTIONS   Only take over-the-counter or prescription medicines as  directed by your health care provider.  If antibiotic medicine was prescribed, take it as directed. Make sure you finish it even if you start to feel better.  Do not have sex until treatment is completed.  Tell all sexual partners that you have a vaginal infection. They should see their health care provider and be treated if they have problems, such as a mild rash or itching.  Practice safe sex by using condoms and only having one sex partner. SEEK MEDICAL CARE IF:   Your symptoms are not improving after 3 days of treatment.  You have increased discharge or pain.  You have a fever. MAKE SURE YOU:   Understand these instructions.  Will watch your condition.  Will get help right away if you are not doing well or get worse. FOR MORE INFORMATION  Centers for Disease Control and Prevention, Division of STD Prevention: www.cdc.gov/std American Sexual Health Association (ASHA): www.ashastd.org  Document Released: 07/08/2005 Document Revised: 04/28/2013 Document Reviewed: 02/17/2013 ExitCare Patient Information 2015 [employer]. This information is not intended to replace advice given to you by your health care provider. Make sure you discuss any questions you have with your health care provider.  

## 2015-03-31 NOTE — Assessment & Plan Note (Signed)
Gray malodorous discharge with clue cells and positive whiff test.  Rx for Flagyl 500mg  BID x 7 days- patient states she's taken this and has tolerated it in the past. Discussed Disulfiram reaction

## 2015-03-31 NOTE — Assessment & Plan Note (Signed)
Checked HIV, RPR, Gc/Chlamydia, and wet prep. Discussed with patient that there is a slight chance that Medicare may not cover HIV testing (however hopefully Medicaid will cover what is not covered). Wet prep +BV.

## 2015-04-01 LAB — BASIC METABOLIC PANEL
BUN: 13 mg/dL (ref 7–25)
CHLORIDE: 106 mmol/L (ref 98–110)
CO2: 28 mmol/L (ref 20–31)
Calcium: 9.6 mg/dL (ref 8.6–10.2)
Creat: 0.8 mg/dL (ref 0.50–1.10)
Glucose, Bld: 44 mg/dL — ABNORMAL LOW (ref 65–99)
Potassium: 4.1 mmol/L (ref 3.5–5.3)
Sodium: 137 mmol/L (ref 135–146)

## 2015-04-01 LAB — HIV ANTIBODY (ROUTINE TESTING W REFLEX): HIV: NONREACTIVE

## 2015-04-01 LAB — RPR

## 2015-04-04 LAB — CERVICOVAGINAL ANCILLARY ONLY
Chlamydia: NEGATIVE
Neisseria Gonorrhea: NEGATIVE

## 2015-04-05 ENCOUNTER — Encounter: Payer: Self-pay | Admitting: Family Medicine

## 2015-04-05 ENCOUNTER — Telehealth: Payer: Self-pay | Admitting: Family Medicine

## 2015-04-05 NOTE — Telephone Encounter (Signed)
Attempted to call patient and let her know that her STD tests were negative- there was no answer. Please have her come back in as we need to change her diabetes medication (her blood sugars are too low).    Thanks, Archie Patten, MD [facility] Resident  04/05/2015, 2:05 PM

## 2015-04-06 NOTE — Telephone Encounter (Signed)
Would like to know A1c

## 2015-04-06 NOTE — Telephone Encounter (Signed)
Left message on voicemail for patient to call office. 

## 2015-04-06 NOTE — Telephone Encounter (Signed)
Message delivered and appt made

## 2015-04-07 NOTE — Telephone Encounter (Signed)
Left message on patient voicemail that we were unable to check A1c at last visit due to issues with machine. Last A1c is from 10/2014 and was 7.6.

## 2015-05-19 ENCOUNTER — Encounter: Payer: Self-pay | Admitting: Family Medicine

## 2015-05-19 ENCOUNTER — Ambulatory Visit (INDEPENDENT_AMBULATORY_CARE_PROVIDER_SITE_OTHER): Payer: Medicare Other | Admitting: Family Medicine

## 2015-05-19 VITALS — BP 110/70 | HR 67 | Temp 97.7°F | Ht 64.0 in | Wt 184.0 lb

## 2015-05-19 DIAGNOSIS — L259 Unspecified contact dermatitis, unspecified cause: Secondary | ICD-10-CM

## 2015-05-19 DIAGNOSIS — M25552 Pain in left hip: Secondary | ICD-10-CM

## 2015-05-19 DIAGNOSIS — I10 Essential (primary) hypertension: Secondary | ICD-10-CM

## 2015-05-19 DIAGNOSIS — E119 Type 2 diabetes mellitus without complications: Secondary | ICD-10-CM | POA: Diagnosis present

## 2015-05-19 LAB — POCT GLYCOSYLATED HEMOGLOBIN (HGB A1C): Hemoglobin A1C: 6.1

## 2015-05-19 LAB — GLUCOSE, CAPILLARY: GLUCOSE-CAPILLARY: 183 mg/dL — AB (ref 65–99)

## 2015-05-19 MED ORDER — IBUPROFEN 600 MG PO TABS: 600.0000 mg | ORAL_TABLET | Freq: Three times a day (TID) | ORAL | Status: DC

## 2015-05-19 MED ORDER — HYDROCORTISONE 0.5 % EX CREA: 1.0000 "application " | TOPICAL_CREAM | Freq: Two times a day (BID) | CUTANEOUS | Status: DC

## 2015-05-19 NOTE — Patient Instructions (Signed)
Congratulations on getting your A1c down to 6.1!  Your rash appears to be a contact dermatitis: start using the ointment I prescribed. Try to avoid fragrant soaps and lotions.  Your left hip pain sounds like it is due to inflammation in the hip joint. I have prescribed Ibuprofen 600mg  every 8 hours: take this scheduled for 5 days, then as needed after that. If the pain is not improving, please follow up with [place]. Contact Dermatitis Dermatitis is redness, soreness, and swelling (inflammation) of the skin. Contact dermatitis is a reaction to certain substances that touch the skin. There are two types of contact dermatitis:   Irritant contact dermatitis. This type is caused by something that irritates your skin, such as dry hands from washing them too much. This type does not require previous exposure to the substance for a reaction to occur. This type is more common.  Allergic contact dermatitis. This type is caused by a substance that you are allergic to, such as a nickel allergy or poison ivy. This type only occurs if you have been exposed to the substance (allergen) before. Upon a repeat exposure, your body reacts to the substance. This type is less common. CAUSES  Many different substances can cause contact dermatitis. Irritant contact dermatitis is most commonly caused by exposure to:   Makeup.   Soaps.   Detergents.   Bleaches.   Acids.   Metal salts, such as nickel.  Allergic contact dermatitis is most commonly caused by exposure to:   Poisonous plants.   Chemicals.   Jewelry.   Latex.   Medicines.   Preservatives in products, such as clothing.  RISK FACTORS This condition is more likely to develop in:   People who have jobs that expose them to irritants or allergens.  People who have certain medical conditions, such as asthma or eczema.  SYMPTOMS  Symptoms of this condition may occur anywhere on your body where the irritant has touched you or is  touched by you. Symptoms include:  Dryness or flaking.   Redness.   Cracks.   Itching.   Pain or a burning feeling.   Blisters.  Drainage of small amounts of blood or clear fluid from skin cracks. With allergic contact dermatitis, there may also be swelling in areas such as the eyelids, mouth, or genitals.  DIAGNOSIS  This condition is diagnosed with a medical history and physical exam. A patch skin test may be performed to help determine the cause. If the condition is related to your job, you may need to see an occupational medicine specialist. TREATMENT Treatment for this condition includes figuring out what caused the reaction and protecting your skin from further contact. Treatment may also include:   Steroid creams or ointments. Oral steroid medicines may be needed in more severe cases.  Antibiotics or antibacterial ointments, if a skin infection is present.  Antihistamine lotion or an antihistamine taken by mouth to ease itching.  A bandage (dressing). HOME CARE INSTRUCTIONS Skin Care  Moisturize your skin as needed.   Apply cool compresses to the affected areas.  Try taking a bath with:  Epsom salts. Follow the instructions on the packaging. You can get these at your local pharmacy or grocery store.  Baking soda. Pour a small amount into the bath as directed by your health care provider.  Colloidal oatmeal. Follow the instructions on the packaging. You can get this at your local pharmacy or grocery store.  Try applying baking soda paste to your skin. Stir water  into baking soda until it reaches a paste-like consistency.  Do not scratch your skin.  Bathe less frequently, such as every other day.  Bathe in lukewarm water. Avoid using hot water. Medicines  Take or apply over-the-counter and prescription medicines only as told by your health care provider.   If you were prescribed an antibiotic medicine, take or apply your antibiotic as told by your  health care provider. Do not stop using the antibiotic even if your condition starts to improve. General Instructions  Keep all follow-up visits as told by your health care provider. This is important.  Avoid the substance that caused your reaction. If you do not know what caused it, keep a journal to try to track what caused it. Write down:  What you eat.  What cosmetic products you use.  What you drink.  What you wear in the affected area. This includes jewelry.  If you were given a dressing, take care of it as told by your health care provider. This includes when to change and remove it. SEEK MEDICAL CARE IF:   Your condition does not improve with treatment.  Your condition gets worse.  You have signs of infection such as swelling, tenderness, redness, soreness, or warmth in the affected area.  You have a fever.  You have new symptoms. SEEK IMMEDIATE MEDICAL CARE IF:   You have a severe headache, neck pain, or neck stiffness.  You vomit.  You feel very sleepy.  You notice red streaks coming from the affected area.  Your bone or joint underneath the affected area becomes painful after the skin has healed.  The affected area turns darker.  You have difficulty breathing.   This information is not intended to replace advice given to you by your health care provider. Make sure you discuss any questions you have with your health care provider.   Document Released: 07/05/2000 Document Revised: 03/29/2015 Document Reviewed: 11/23/2014 Elsevier Interactive Patient Education [employer].

## 2015-05-19 NOTE — Assessment & Plan Note (Signed)
Most likely due to inflammation from overuse, as there are no symptoms in the AM and it becomes progressively worse. Could be early osteoarthritis, however she continues to have full range of motion in that hip. No warmth/erythema concerning for septic joint. No history of injury. - Rx for ibuprofen 600mg  TID x 5 days, then PRN after that. - Discussed rest for a few days, followed by ROM exercises. - Patient could also benefit from weight loss as this would take of the pressure off her hips. - RTC precautions discussed.

## 2015-05-19 NOTE — Progress Notes (Signed)
Patient ID: Cathy Bond, female   DOB: 15-Jul-1971, 44 y.o.   MRN: [id number]    Subjective: CC: f/u DM and HTN HPI: Patient is a 44 y.o. female with a past medical history of HTN, T2DM presenting to clinic today for a f/u on DM and HTN.   Rash Has had rash off and on for over the last 2 years. The rash normally comes on when she gets hot. She woke up with the rash present this AM.  Location: Arms, back, some in her scalp  Medications tried: allergy tablets make it go away.  Similar rash in past: yes Recent travel: no  New detergent or soap: She knows fragrant soaps break her out, now using Dove lavender that her son gave her.  Immunocompromised: no  Symptoms Itching: yes Pain over rash: no  Feeling ill all over: no  Fever: no  Mouth sores: no  Face or tongue swelling: no  Trouble breathing: no  Joint swelling or pain: no  No one else in the home has this rash.  Hip and back pain: Started having L hip pain 4 months ago with prolonged walking. She now has some pain in the lumbar region on the L sided as well. No weakness in the LEs, Some stable intermittent numbness/tingling in the feet bilaterally. No bowel/urinary incontinence. No radicular symptoms. No fevers, swelling, or pooping sensation. In the morning when she wakes up she feels better and the L hip pain progressively worsens throughout the day.  Stretching makes the lower back pain better.  Icy Hot patch and heating pad provided some improvement.   Diabetes:  Check CBGs daily: Lowest is 129 in the AM, the highest was 149  Taking medications: glipizide 5mg    Side effects: None  ROS: denies fever, chills, dizziness, LOC, polyuria, polydipsia, numbness or tingling in extremities or chest pain. Last eye exam: Had it 2 years ago (went to the diabetic [facility] center) Last foot exam: 10/2014 Last A1c: 7.6 in 10/2014 Last flu, zoster and/or pneumovax: due to flu shot She is now drinking Pepsi One and Diet Cokes which is an  improvement.  Concerns for rash with metformin in the past.   Hypertension Blood pressure at home: Doesn't take Blood pressure today: 110/70  Meds: Compliant with losartan 50mg  daily. Side effects: No  ROS: Denies headache, dizziness, visual changes, nausea, vomiting, chest pain, abdominal pain or shortness of breath.   Social History: 2ppd The patient was counseled on the dangers of tobacco use, and was advised to quit.  Reviewed strategies to maximize success, including stress management, support of family/friends and written materials.   Health Maintenance: due for eye exam and flu vaccine, defers both today  ROS: All other systems reviewed and are negative.  Past Medical History Patient Active Problem List   Diagnosis Date Noted  . Left hip pain 05/19/2015  . Contact dermatitis 05/19/2015  . Uterine fibroid 05/25/2014  . Endometrial polyp 05/25/2014  . Left knee pain 05/20/2014  . Amenorrhea 05/17/2014  . Right knee pain 10/11/2013  . Pelvic pain 03/31/2013  . Breast tenderness 03/31/2013  . Lump of right breast 09/10/2012  . HTN (hypertension) 09/08/2012  . Weight loss, unintentional 05/03/2012  . Abnormal vaginal bleeding 12/27/2011  . Menorrhagia 12/27/2011  . Diabetes type 2, controlled ([place]) 12/09/2011  . Acid reflux 11/19/2011  . Left facial numbness 09/23/2011  . Low back pain 08/23/2011  . Depression 08/08/2011  . Bipolar disorder ([place]) 02/28/2011  . Fatigue 02/26/2011  .  Allergic rhinitis due to pollen 10/18/2010  . OBESITY 10/23/2009    Medications- reviewed and updated Current Outpatient Prescriptions  Medication Sig Dispense Refill  . aspirin EC 81 MG EC tablet Take 1 tablet (81 mg total) by mouth daily. 90 tablet 3  . EPINEPHrine (EPIPEN) 0.3 mg/0.3 mL DEVI Inject 0.3 mLs (0.3 mg total) into the muscle as needed. 1 Device 1  . glipiZIDE (GLUCOTROL) 5 MG tablet TAKE 1 TABLET BY MOUTH DAILY 90 tablet 0  . hydrocortisone cream 0.5 % Apply 1  application topically 2 (two) times daily. 30 g 0  . ibuprofen (ADVIL,MOTRIN) 600 MG tablet Take 1 tablet (600 mg total) by mouth 3 (three) times daily. For 5 days, then as needed for pain after that 30 tablet 0  . losartan (COZAAR) 50 MG tablet Take 1 tablet (50 mg total) by mouth daily. 90 tablet 3  . megestrol (MEGACE) 40 MG tablet Take 1 tablet (40 mg total) by mouth daily. 30 tablet 3  . metroNIDAZOLE (FLAGYL) 500 MG tablet Take 1 tablet (500 mg total) by mouth 3 (three) times daily. 14 tablet 0  . Multiple Vitamin (MULITIVITAMIN WITH MINERALS) TABS Take 1 tablet by mouth daily.    . Multiple Vitamins-Minerals (HAIR/SKIN/NAILS PO) Take 2 tablets by mouth 2 (two) times daily.    . nitroGLYCERIN (NITROSTAT) 0.3 MG SL tablet Place 1 tablet (0.3 mg total) under the tongue every 5 (five) minutes as needed for chest pain. 10 tablet 0   No current facility-administered medications for this visit.    Objective: Office vital signs reviewed. BP 110/70 mmHg  Pulse 67  Temp(Src) 97.7 F (36.5 C) (Oral)  Ht 5\' 4"  (1.626 m)  Wt 184 lb (83.462 kg)  BMI 31.57 kg/m2  SpO2 97%  LMP 05/12/2015   Physical Examination:  General: Awake, alert, well- nourished, NAD Cardio: RRR, no m/r/g noted. No pitting edema noted Pulm: No increased WOB.  CTAB, without wheezes, rhonchi or crackles noted.  GI: soft, NT/ND,+BS x4 MSK: No TTP over the spinous processes. Mild TTP over the L paraspinal muscles. No tenderness to palpation over the L hip. 5/5 strength in the hips bilaterally.  Full range of motion in the hips with reported mild pain in left hip Neuro: sensation intact in the LE bilaterally.  Skin: small erythematous papules over the forearms bilaterally, with one small section of tiny clustered vesicular looking lesions,  occasional erythematous papule over the upper back. No warmth to touch.  Assessment/Plan: Left hip pain Most likely due to inflammation from overuse, as there are no symptoms in the AM  and it becomes progressively worse. Could be early osteoarthritis, however she continues to have full range of motion in that hip. No warmth/erythema concerning for septic joint. No history of injury. - Rx for ibuprofen 600mg  TID x 5 days, then PRN after that. - Discussed rest for a few days, followed by ROM exercises. - Patient could also benefit from weight loss as this would take of the pressure off her hips. - RTC precautions discussed.   HTN (hypertension) BP at goal today and at previous visits. Last BMET unremarkable. No evidence of hypotension. - continue losartan 50mg  daily f - f/u 3 months.   Diabetes type 2, controlled A1c today was 6.1. There was an initial concern for hypoglycemia due to a BMET glucose of 44, however I suspect that it was not accurate due to the prolonged time to running the test. No CBGs less than 100 per the  patient. We discussed going down to glipizide 2.5mg  daily and the patient asked that we not do that, as she felt her CBGs were lower with that than with the 5mg . Advised that we could continue with glipizide 5mg  for now, however if she starts having episodes of hypoglycemia then we would need to attempt to go back to 2.5mg  daily.  Could consider going back to metformin. It appears that it was discontinued as the patient develop a rash, however I am curious if it is the same rash that she's had intermittently since then. Urged her to f/u with Diabetic eye center. Foot exam up to date without evidence of peripheral neuropathy. - f/u 3 months - repeat A1c and capillary glucose at that time  Contact dermatitis Patient's symptoms, history, and exam consistent with contact dermatitis; most likely from new fragrant body wash. No fevers/chills concerning for infection. No new medications. No excessive fatigue/myalgias concerning for something systemic.  - dicussed avoiding fragrant body washes, lotions, detergents - topical corticosteroid cream. - can continue to use a  PO antihistamine - RCT precautions discussed: worsening rash, fevers, arthralgias, myalgias.     Orders Placed This Encounter  Procedures  . Glucose, capillary  . HgB A1c  . Glucose (CBG)    Meds ordered this encounter  Medications  . ibuprofen (ADVIL,MOTRIN) 600 MG tablet    Sig: Take 1 tablet (600 mg total) by mouth 3 (three) times daily. For 5 days, then as needed for pain after that    Dispense:  30 tablet    Refill:  0  . hydrocortisone cream 0.5 %    Sig: Apply 1 application topically 2 (two) times daily.    Dispense:  30 g    Refill:  [place] PGY-2, [place]

## 2015-05-19 NOTE — Assessment & Plan Note (Signed)
Patient's symptoms, history, and exam consistent with contact dermatitis; most likely from new fragrant body wash. No fevers/chills concerning for infection. No new medications. No excessive fatigue/myalgias concerning for something systemic.  - dicussed avoiding fragrant body washes, lotions, detergents - topical corticosteroid cream. - can continue to use a PO antihistamine - RCT precautions discussed: worsening rash, fevers, arthralgias, myalgias.

## 2015-05-19 NOTE — Assessment & Plan Note (Addendum)
A1c today was 6.1. There was an initial concern for hypoglycemia due to a BMET glucose of 44, however I suspect that it was not accurate due to the prolonged time to running the test. No CBGs less than 100 per the patient. We discussed going down to glipizide 2.5mg  daily and the patient asked that we not do that, as she felt her CBGs were lower with that than with the 5mg . Advised that we could continue with glipizide 5mg  for now, however if she starts having episodes of hypoglycemia then we would need to attempt to go back to 2.5mg  daily.  Could consider going back to metformin. It appears that it was discontinued as the patient develop a rash, however I am curious if it is the same rash that she's had intermittently since then. Urged her to f/u with [facility]abetic eye center. Foot exam up to date without evidence of peripheral neuropathy. - f/u 3 months - repeat A1c and capillary glucose at that time

## 2015-05-19 NOTE — Assessment & Plan Note (Signed)
BP at goal today and at previous visits. Last BMET unremarkable. No evidence of hypotension. - continue losartan 50mg  daily f - f/u 3 months.

## 2015-05-29 ENCOUNTER — Other Ambulatory Visit: Payer: Self-pay | Admitting: Family Medicine

## 2015-08-28 ENCOUNTER — Emergency Department (INDEPENDENT_AMBULATORY_CARE_PROVIDER_SITE_OTHER)
Admission: EM | Admit: 2015-08-28 | Discharge: 2015-08-28 | Disposition: A | Discharge status: Home / Self Care | Payer: Medicare Other | Source: Home / Self Care | Attending: Family Medicine | Admitting: Family Medicine

## 2015-08-28 ENCOUNTER — Other Ambulatory Visit (HOSPITAL_COMMUNITY)
Admission: RE | Admit: 2015-08-28 | Discharge: 2015-08-28 | Disposition: A | Discharge status: Home / Self Care | Payer: Medicare Other | Source: Ambulatory Visit | Attending: Family Medicine | Admitting: Family Medicine

## 2015-08-28 ENCOUNTER — Encounter (HOSPITAL_COMMUNITY): Payer: Self-pay | Admitting: *Deleted

## 2015-08-28 DIAGNOSIS — J029 Acute pharyngitis, unspecified: Secondary | ICD-10-CM

## 2015-08-28 LAB — POCT RAPID STREP A: STREPTOCOCCUS, GROUP A SCREEN (DIRECT): NEGATIVE

## 2015-08-28 MED ORDER — CLINDAMYCIN HCL 300 MG PO CAPS: 300.0000 mg | ORAL_CAPSULE | Freq: Three times a day (TID) | ORAL | Status: DC

## 2015-08-28 NOTE — ED Provider Notes (Signed)
CSN: [id number]     Arrival date & time 08/28/15  1619 History   First MD Initiated Contact with Patient 08/28/15 1751     Chief Complaint  Patient presents with  . Sore Throat   (Consider location/radiation/quality/duration/timing/severity/associated sxs/prior Treatment) Patient is a 45 y.o. female presenting with pharyngitis. The history is provided by the patient.  Sore Throat This is a new problem. The current episode started 2 days ago. The problem has been gradually worsening (smokes 2ppd). Pertinent negatives include no chest pain, no abdominal pain and no headaches.    Past Medical History  Diagnosis Date  . Seasonal allergies   . Depression   . Anxiety   . BV (bacterial vaginosis)   . Trichomonas   . Yeast vaginitis   . Diabetes mellitus    Past Surgical History  Procedure Laterality Date  . Tubal ligation    . Toe nail    . Endometrial biopsy  2 in 2013    negative   Family History  Problem Relation Age of Onset  . Diabetes Mother   . Stroke Mother   . Depression Sister   . Alcohol abuse Sister   . Diabetes Sister   . Depression Brother   . Diabetes Brother    Social History  Substance Use Topics  . Smoking status: Former Smoker -- 2.00 packs/day for 6 years    Types: Cigarettes    Quit date: 01/19/2014  . Smokeless tobacco: Never Used     Comment: used patches before  . Alcohol Use: No   OB History    Gravida Para Term Preterm AB TAB SAB Ectopic Multiple Living   5 5 5       5      Review of Systems  Constitutional: Negative.   HENT: Positive for ear pain, sore throat and trouble swallowing.   Cardiovascular: Negative for chest pain.  Gastrointestinal: Negative for abdominal pain.  Neurological: Negative for headaches.  All other systems reviewed and are negative.   Allergies  Apple; Shellfish allergy; and Wellbutrin  Home Medications   Prior to Admission medications   Medication Sig Start Date End Date Taking? Authorizing Provider   aspirin EC 81 MG EC tablet Take 1 tablet (81 mg total) by mouth daily. 09/07/12   Leone Haven, MD  clindamycin (CLEOCIN) 300 MG capsule Take 1 capsule (300 mg total) by mouth 3 (three) times daily. 08/28/15   Billy Fischer, MD  EPINEPHrine (EPIPEN) 0.3 mg/0.3 mL DEVI Inject 0.3 mLs (0.3 mg total) into the muscle as needed. 01/24/12   Teressa Lower, MD  glipiZIDE (GLUCOTROL) 5 MG tablet TAKE 1 TABLET BY MOUTH DAILY 05/30/15   Archie Patten, MD  hydrocortisone cream 0.5 % Apply 1 application topically 2 (two) times daily. 05/19/15   Archie Patten, MD  ibuprofen (ADVIL,MOTRIN) 600 MG tablet Take 1 tablet (600 mg total) by mouth 3 (three) times daily. For 5 days, then as needed for pain after that 05/19/15   Archie Patten, MD  losartan (COZAAR) 50 MG tablet Take 1 tablet (50 mg total) by mouth daily. 10/26/14   [place], MD  megestrol (MEGACE) 40 MG tablet Take 1 tablet (40 mg total) by mouth daily. 08/01/14   Lavonia Drafts, MD  metroNIDAZOLE (FLAGYL) 500 MG tablet Take 1 tablet (500 mg total) by mouth 3 (three) times daily. 03/31/15   Archie Patten, MD  Multiple Vitamin (MULITIVITAMIN WITH MINERALS) TABS Take 1 tablet by mouth daily.  Historical Provider, MD  Multiple Vitamins-Minerals (HAIR/SKIN/NAILS PO) Take 2 tablets by mouth 2 (two) times daily.    Historical Provider, MD  nitroGLYCERIN (NITROSTAT) 0.3 MG SL tablet Place 1 tablet (0.3 mg total) under the tongue every 5 (five) minutes as needed for chest pain. 09/07/12   Leone Haven, MD   Meds Ordered and Administered this Visit  Medications - No data to display  BP 124/81 mmHg  Pulse 86  Temp(Src) 98.4 F (36.9 C) (Oral)  Resp 16  SpO2 100%  LMP 08/06/2015 No data found.   Physical Exam  Constitutional: She is oriented to person, place, and time. She appears well-developed and well-nourished. She appears distressed.  HENT:  Head: Normocephalic.  Right Ear: External ear normal.  Left Ear: External  ear normal.  Mouth/Throat: Uvula is midline and mucous membranes are normal. Oropharyngeal exudate, posterior oropharyngeal edema and posterior oropharyngeal erythema present.  Heavy exudate on left tonsil.  Eyes: Pupils are equal, round, and reactive to light.  Neck: Normal range of motion. Neck supple.  Cardiovascular: Normal heart sounds.   Pulmonary/Chest: Effort normal and breath sounds normal.  Lymphadenopathy:    She has cervical adenopathy.  Neurological: She is alert and oriented to person, place, and time.  Skin: Skin is warm and dry.  Nursing note and vitals reviewed.   ED Course  Procedures (including critical care time)  Labs Review Labs Reviewed  POCT RAPID STREP A   Strep neg. Imaging Review No results found.   Visual Acuity Review  Right Eye Distance:   Left Eye Distance:   Bilateral Distance:    Right Eye Near:   Left Eye Near:    Bilateral Near:         MDM   1. Acute pharyngitis, unspecified pharyngitis type    Meds ordered this encounter  Medications  . clindamycin (CLEOCIN) 300 MG capsule    Sig: Take 1 capsule (300 mg total) by mouth 3 (three) times daily.    Dispense:  21 capsule    Refill:  0       Billy Fischer, MD 08/28/15 [phone]

## 2015-08-28 NOTE — ED Notes (Signed)
Pt  Reports   sorethroat       X  sev  Days          dcereased  hearring  l  Ear           white  Patch  In throat        Symptoms      X  1  Week            Pt  Is  A  Smoker

## 2015-08-28 NOTE — Discharge Instructions (Signed)
Drink lots of fluids, take all of medicine, use lozenges as needed.return if needed °

## 2015-08-29 ENCOUNTER — Other Ambulatory Visit: Payer: Self-pay | Admitting: Family Medicine

## 2015-08-30 LAB — CULTURE, GROUP A STREP (THRC)

## 2015-10-19 ENCOUNTER — Other Ambulatory Visit: Payer: Self-pay | Admitting: *Deleted

## 2015-10-20 MED ORDER — LOSARTAN POTASSIUM 50 MG PO TABS: 50.0000 mg | ORAL_TABLET | Freq: Every day | ORAL | Status: DC

## 2015-10-20 NOTE — Telephone Encounter (Signed)
2nd request.  Verneal Wiers L, RN  

## 2015-10-20 NOTE — Telephone Encounter (Signed)
Medication refilled. Please let the patient know that she should in general anticipate a 48 hour business hour turn around.  Thanks, Archie Patten, MD [facility] Resident  10/20/2015, 12:15 PM

## 2015-11-17 ENCOUNTER — Ambulatory Visit (INDEPENDENT_AMBULATORY_CARE_PROVIDER_SITE_OTHER): Payer: Medicare Other | Admitting: Family Medicine

## 2015-11-17 VITALS — BP 116/81 | HR 95 | Temp 98.2°F | Ht 64.0 in | Wt 179.5 lb

## 2015-11-17 DIAGNOSIS — M25552 Pain in left hip: Secondary | ICD-10-CM

## 2015-11-17 DIAGNOSIS — M533 Sacrococcygeal disorders, not elsewhere classified: Secondary | ICD-10-CM | POA: Diagnosis present

## 2015-11-17 DIAGNOSIS — E119 Type 2 diabetes mellitus without complications: Secondary | ICD-10-CM | POA: Diagnosis not present

## 2015-11-17 LAB — POCT GLYCOSYLATED HEMOGLOBIN (HGB A1C): HEMOGLOBIN A1C: 6.1

## 2015-11-17 MED ORDER — IBUPROFEN 200 MG PO TABS: 400.0000 mg | ORAL_TABLET | Freq: Three times a day (TID) | ORAL | Status: DC | PRN

## 2015-11-17 NOTE — Patient Instructions (Addendum)
Thank you for coming in today!  Take ibuprofen for the next 5 days or so to calm inflammation Continue stretches/activity as tolerated Follow up with your primary doctor in about 6 weeks or sooner as needed  Our clinic's number is [phone]. Feel free to call any time with questions or concerns. We will answer any questions after hours with our 24-hour emergency line at that number as well.   - Dr. Bonner Puna   Lumbosacral Strain Lumbosacral strain is a strain of any of the parts that make up your lumbosacral vertebrae. Your lumbosacral vertebrae are the bones that make up the lower third of your backbone. Your lumbosacral vertebrae are held together by muscles and tough, fibrous tissue (ligaments).  CAUSES  A sudden blow to your back can cause lumbosacral strain. Also, anything that causes an excessive stretch of the muscles in the low back can cause this strain. This is typically seen when people exert themselves strenuously, fall, lift heavy objects, bend, or crouch repeatedly. RISK FACTORS  Physically demanding work.  Participation in pushing or pulling sports or sports that require a sudden twist of the back (tennis, golf, baseball).  Weight lifting.  Excessive lower back curvature.  Forward-tilted pelvis.  Weak back or abdominal muscles or both.  Tight hamstrings. SIGNS AND SYMPTOMS  Lumbosacral strain may cause pain in the area of your injury or pain that moves (radiates) down your leg.  DIAGNOSIS Your health care provider can often diagnose lumbosacral strain through a physical exam. In some cases, you may need tests such as X-ray exams.  TREATMENT  Treatment for your lower back injury depends on many factors that your clinician will have to evaluate. However, most treatment will include the use of anti-inflammatory medicines. HOME CARE INSTRUCTIONS   Avoid hard physical activities (tennis, racquetball, waterskiing) if you are not in proper physical condition for it. This  may aggravate or create problems.  If you have a back problem, avoid sports requiring sudden body movements. Swimming and walking are generally safer activities.  Maintain good posture.  Maintain a healthy weight.  For acute conditions, you may put ice on the injured area.  Put ice in a plastic bag.  Place a towel between your skin and the bag.  Leave the ice on for 20 minutes, 2-3 times a day.  When the low back starts healing, stretching and strengthening exercises may be recommended. SEEK MEDICAL CARE IF:  Your back pain is getting worse.  You experience severe back pain not relieved with medicines. SEEK IMMEDIATE MEDICAL CARE IF:   You have numbness, tingling, weakness, or problems with the use of your arms or legs.  There is a change in bowel or bladder control.  You have increasing pain in any area of the body, including your belly (abdomen).  You notice shortness of breath, dizziness, or feel faint.  You feel sick to your stomach (nauseous), are throwing up (vomiting), or become sweaty.  You notice discoloration of your toes or legs, or your feet get very cold. MAKE SURE YOU:   Understand these instructions.  Will watch your condition.  Will get help right away if you are not doing well or get worse.   This information is not intended to replace advice given to you by your health care provider. Make sure you discuss any questions you have with your health care provider.   Document Released: 04/17/2005 Document Revised: 07/29/2014 Document Reviewed: 02/24/2013 Elsevier Interactive Patient Education [employer].

## 2015-11-17 NOTE — Assessment & Plan Note (Addendum)
Mechanical etiology favored over inflammatory in absence of other auto-immune symptoms. Primary joint disease vs. referred pain from degenerative disc at L5-S1, spinal stenosis, or osteoarthritis of the hip. No red flags.  - Short course of NSAIDs - Encouraged continued activity as tolerated - Provided exercises for low back pain - F/u with PCP in 6 weeks

## 2015-11-17 NOTE — Progress Notes (Signed)
Subjective: Cathy Bond is a 45 y.o. female here for low back pain.   She reports gradual onset of moderate-severe bilateral lower back pain for the past 1 month. This has been an on and off problem for a year or two. The pain is sharp and nonradiating, worsened by standing and sometimes walking, better when sitting or laying down, so she's been staying relatively sedentary lately. No medications tried. No bowel/bladder problems, fever, chills, unintentional weight loss, night time awakenings secondary to pain, or weakness in legs  Reports not checking CBGs regularly, but denies CBGs outside the range of 90 - 150. Continuing to take glipizide once daily. Denies hypoglycemic symptoms.   - ROS: Denies fever, chills, weight loss, dizziness, vision changes, syncope, polyuria, nocturia, numbness, polydipsia, chest pain, new wounds.  - [place]: Current non-smoker, occasional EtOH, no illicit drugs.  - Medications: reviewed and updated  Objective: BP 116/81 mmHg  Pulse 95  Temp(Src) 98.2 F (36.8 C) (Oral)  Ht 5\' 4"  (1.626 m)  Wt 179 lb 8 oz (81.421 kg)  BMI 30.80 kg/m2 Gen: Well-appearing 45 y.o.female in no distress HEENT: Normocephalic, conjunctivae and anterior segment wnl, MMM, posterior oropharynx clear Neck: Neck supple, no masses or lymphadenopathy; thyroid not enlarged  Pulm: Non-labored; CTAB, no wheezes  CV: Regular rate, no murmur appreciated; no LE edema, no JVD Back: Normal skin. Spine with normal alignment, no deformity. Bilateral SI joints tender to palpation. No tenderness to vertebral process palpation. Lumbar paraspinous muscles are not tender and without spasm. Range of motion is full at neck and lumbar sacral regions. Straight leg raise is negative Neuro:  Sensation and motor function 5/5 bilateral lower extremities. Patellar DTR's 2+, no ankle clonus. Skin: No wounds or rashes, no acanthosis nigricans  Assessment & Plan: Cathy Bond is a 45 y.o. female here for  well-controlled T2DM and flare of chronic back pain.    Sacroiliac joint dysfunction of both sides Mechanical etiology favored over inflammatory in absence of other auto-immune symptoms. Primary joint disease vs. referred pain from degenerative disc at L5-S1, spinal stenosis, or osteoarthritis of the hip. No red flags.  - Short course of NSAIDs - Encouraged continued activity as tolerated - Provided exercises for low back pain - F/u with PCP in 6 weeks  Diabetes type 2, controlled Well controlled with Hb A1c static at 6.1% from last check in Oct 2016 (checked per RN protocol). Pt denies hypoglycemic readings or symptoms, so ok to continue current care.

## 2015-11-17 NOTE — Assessment & Plan Note (Signed)
Well controlled with Hb A1c static at 6.1% from last check in Oct 2016 (checked per RN protocol). Pt denies hypoglycemic readings or symptoms, so ok to continue current care.

## 2015-11-27 ENCOUNTER — Other Ambulatory Visit: Payer: Self-pay | Admitting: Family Medicine

## 2016-02-22 ENCOUNTER — Other Ambulatory Visit: Payer: Self-pay | Admitting: Family Medicine

## 2016-03-15 ENCOUNTER — Ambulatory Visit (INDEPENDENT_AMBULATORY_CARE_PROVIDER_SITE_OTHER): Payer: Medicare Other | Admitting: Internal Medicine

## 2016-03-15 ENCOUNTER — Other Ambulatory Visit (HOSPITAL_COMMUNITY)
Admission: RE | Admit: 2016-03-15 | Discharge: 2016-03-15 | Disposition: A | Discharge status: Home / Self Care | Payer: Medicare Other | Source: Ambulatory Visit | Attending: Family Medicine | Admitting: Family Medicine

## 2016-03-15 ENCOUNTER — Encounter: Payer: Self-pay | Admitting: Internal Medicine

## 2016-03-15 VITALS — BP 108/75 | HR 90 | Temp 98.4°F | Ht 64.0 in | Wt 185.2 lb

## 2016-03-15 DIAGNOSIS — L298 Other pruritus: Secondary | ICD-10-CM

## 2016-03-15 DIAGNOSIS — Z113 Encounter for screening for infections with a predominantly sexual mode of transmission: Secondary | ICD-10-CM | POA: Diagnosis present

## 2016-03-15 DIAGNOSIS — N912 Amenorrhea, unspecified: Secondary | ICD-10-CM | POA: Diagnosis not present

## 2016-03-15 DIAGNOSIS — N898 Other specified noninflammatory disorders of vagina: Secondary | ICD-10-CM | POA: Insufficient documentation

## 2016-03-15 LAB — POCT WET PREP (WET MOUNT)
Clue Cells Wet Prep Whiff POC: NEGATIVE
TRICHOMONAS WET PREP HPF POC: ABSENT

## 2016-03-15 LAB — POCT URINE PREGNANCY: PREG TEST UR: NEGATIVE

## 2016-03-15 MED ORDER — FLUCONAZOLE 150 MG PO TABS: 150.0000 mg | ORAL_TABLET | Freq: Once | ORAL | 0 refills | Status: AC

## 2016-03-15 NOTE — Progress Notes (Signed)
   Subjective:    Cathy Bond - 45 y.o. female MRN [id number]  Date of birth: [birth year]  HPI  Cathy Bond is here for SDA for vaginal discharge.  VAGINAL DISCHARGE  Having vaginal discharge for 3 days. Associated with pruritis and burning in the vaginal area.  Medications tried: none.  Discharge consistency: unsure  Discharge color: white  Recent antibiotic use: none  Sex in last month: yes, unprotected  Possible STD exposure:yes   Symptoms Fever: no Dysuria: no Vaginal bleeding: no Abdomen or Pelvic pain: no Back pain: no Genital sores or ulcers:no Rash: no Pain during sex: no Missed menstrual period: yes, has been having irregular/missed periods for 6-7 months. LMP was in May and lasted only 1-2 days and was very light.     -  reports that she quit smoking about 2 years ago. Her smoking use included Cigarettes. She has a 12.00 pack-year smoking history. She has never used smokeless tobacco. - Review of Systems: Per HPI. - Past Medical History: Patient Active Problem List   Diagnosis Date Noted  . Vaginal discharge 03/15/2016  . Sacroiliac joint dysfunction of both sides 11/17/2015  . Left hip pain 05/19/2015  . Contact dermatitis 05/19/2015  . Uterine fibroid 05/25/2014  . Endometrial polyp 05/25/2014  . Left knee pain 05/20/2014  . Amenorrhea 05/17/2014  . Right knee pain 10/11/2013  . Pelvic pain 03/31/2013  . Breast tenderness 03/31/2013  . Lump of right breast 09/10/2012  . HTN (hypertension) 09/08/2012  . Weight loss, unintentional 05/03/2012  . Abnormal vaginal bleeding 12/27/2011  . Menorrhagia 12/27/2011  . Diabetes type 2, controlled ([place]) 12/09/2011  . Acid reflux 11/19/2011  . Left facial numbness 09/23/2011  . Low back pain 08/23/2011  . Depression 08/08/2011  . Bipolar disorder ([place]) 02/28/2011  . Fatigue 02/26/2011  . Allergic rhinitis due to pollen 10/18/2010  . OBESITY 10/23/2009   - Medications: reviewed and updated      Objective:   Physical Exam BP 108/75 (BP Location: Left Arm, Patient Position: Sitting, Cuff Size: Normal)   Pulse 90   Temp 98.4 F (36.9 C) (Oral)   Ht 5\' 4"  (1.626 m)   Wt 185 lb 3.2 oz (84 kg)   BMI 31.79 kg/m  Gen: NAD, alert, cooperative with exam, well-appearing GU/GYN: Exam performed in the presence of a chaperone. External genitalia within normal limits and without lesions or rashes.  Vaginal mucosa pink, moist, normal rugae.  Nonfriable cervix without lesions or bleeding noted on speculum exam.  Moderate amount of thick white discharge present in posterior fornix. Bimanual exam revealed normal, nongravid uterus.  No cervical motion tenderness. No adnexal masses bilaterally.          Assessment & Plan:   Vaginal discharge Exam and wet prep consistent with a mild yeast infection. Patient is at risk for STD. Urine pregnancy test was obtained given h/o of amenorrhea (on chart review patient does have a history of irregular menses) and was negative. -GC/Chlamydia collected  -Rx for Diflucan was given      Phill Myron, D.O. 03/15/2016, 1:34 PM PGY-2, [place]

## 2016-03-15 NOTE — Assessment & Plan Note (Addendum)
Exam and wet prep consistent with a mild yeast infection. Patient is at risk for STD. Urine pregnancy test was obtained given h/o of amenorrhea (on chart review patient does have a history of irregular menses) and was negative. -GC/Chlamydia collected  -Rx for Diflucan was given

## 2016-03-15 NOTE — Patient Instructions (Addendum)
The swab showed that you have a yeast infection. Take one Diflucan to treat this. If you are still having burning/itching in 48-72 hours take the second tablet. Please return if Diflucan does not treat your symptoms.   We will call you with your other results.   Take Care,   Dr. Juleen China

## 2016-03-18 LAB — CERVICOVAGINAL ANCILLARY ONLY
CHLAMYDIA, DNA PROBE: NEGATIVE
NEISSERIA GONORRHEA: NEGATIVE

## 2016-04-01 ENCOUNTER — Encounter (HOSPITAL_COMMUNITY): Payer: Self-pay

## 2016-04-01 ENCOUNTER — Emergency Department (HOSPITAL_COMMUNITY)
Admission: EM | Admit: 2016-04-01 | Discharge: 2016-04-01 | Disposition: A | Discharge status: Home / Self Care | Payer: Medicare Other | Attending: Emergency Medicine | Admitting: Emergency Medicine

## 2016-04-01 ENCOUNTER — Emergency Department (HOSPITAL_COMMUNITY): Payer: Medicare Other

## 2016-04-01 DIAGNOSIS — N390 Urinary tract infection, site not specified: Secondary | ICD-10-CM

## 2016-04-01 DIAGNOSIS — R102 Pelvic and perineal pain: Secondary | ICD-10-CM | POA: Diagnosis not present

## 2016-04-01 DIAGNOSIS — Z7984 Long term (current) use of oral hypoglycemic drugs: Secondary | ICD-10-CM | POA: Insufficient documentation

## 2016-04-01 DIAGNOSIS — D251 Intramural leiomyoma of uterus: Secondary | ICD-10-CM | POA: Diagnosis not present

## 2016-04-01 DIAGNOSIS — Z87891 Personal history of nicotine dependence: Secondary | ICD-10-CM | POA: Insufficient documentation

## 2016-04-01 DIAGNOSIS — E119 Type 2 diabetes mellitus without complications: Secondary | ICD-10-CM | POA: Insufficient documentation

## 2016-04-01 DIAGNOSIS — R350 Frequency of micturition: Secondary | ICD-10-CM | POA: Diagnosis not present

## 2016-04-01 DIAGNOSIS — I10 Essential (primary) hypertension: Secondary | ICD-10-CM | POA: Diagnosis not present

## 2016-04-01 DIAGNOSIS — Z79899 Other long term (current) drug therapy: Secondary | ICD-10-CM | POA: Insufficient documentation

## 2016-04-01 DIAGNOSIS — Z7982 Long term (current) use of aspirin: Secondary | ICD-10-CM | POA: Insufficient documentation

## 2016-04-01 LAB — BASIC METABOLIC PANEL
Anion gap: 7 (ref 5–15)
BUN: 15 mg/dL (ref 6–20)
CHLORIDE: 101 mmol/L (ref 101–111)
CO2: 29 mmol/L (ref 22–32)
CREATININE: 0.96 mg/dL (ref 0.44–1.00)
Calcium: 9.3 mg/dL (ref 8.9–10.3)
Glucose, Bld: 92 mg/dL (ref 65–99)
Potassium: 3.7 mmol/L (ref 3.5–5.1)
SODIUM: 137 mmol/L (ref 135–145)

## 2016-04-01 LAB — CBC WITH DIFFERENTIAL/PLATELET
BASOS ABS: 0 10*3/uL (ref 0.0–0.1)
BASOS PCT: 0 %
EOS PCT: 2 %
Eosinophils Absolute: 0.1 10*3/uL (ref 0.0–0.7)
HCT: 35.7 % — ABNORMAL LOW (ref 36.0–46.0)
Hemoglobin: 12.3 g/dL (ref 12.0–15.0)
LYMPHS PCT: 34 %
Lymphs Abs: 2.3 10*3/uL (ref 0.7–4.0)
MCH: 28.2 pg (ref 26.0–34.0)
MCHC: 34.5 g/dL (ref 30.0–36.0)
MCV: 81.9 fL (ref 78.0–100.0)
MONO ABS: 0.4 10*3/uL (ref 0.1–1.0)
Monocytes Relative: 6 %
NEUTROS ABS: 4 10*3/uL (ref 1.7–7.7)
Neutrophils Relative %: 58 %
PLATELETS: 251 10*3/uL (ref 150–400)
RBC: 4.36 MIL/uL (ref 3.87–5.11)
RDW: 13 % (ref 11.5–15.5)
WBC: 6.7 10*3/uL (ref 4.0–10.5)

## 2016-04-01 LAB — URINALYSIS, ROUTINE W REFLEX MICROSCOPIC
BILIRUBIN URINE: NEGATIVE
Glucose, UA: NEGATIVE mg/dL
KETONES UR: NEGATIVE mg/dL
NITRITE: NEGATIVE
Protein, ur: 30 mg/dL — AB
Specific Gravity, Urine: 1.015 (ref 1.005–1.030)
pH: 8 (ref 5.0–8.0)

## 2016-04-01 LAB — WET PREP, GENITAL
Clue Cells Wet Prep HPF POC: NONE SEEN
SPERM: NONE SEEN
TRICH WET PREP: NONE SEEN
Yeast Wet Prep HPF POC: NONE SEEN

## 2016-04-01 LAB — URINE MICROSCOPIC-ADD ON: Bacteria, UA: NONE SEEN

## 2016-04-01 MED ORDER — CEFTRIAXONE SODIUM 1 G IJ SOLR
1.0000 g | Freq: Once | INTRAMUSCULAR | Status: AC
  Administered 2016-04-01: 1 g via INTRAMUSCULAR
  Filled 2016-04-01: qty 10

## 2016-04-01 MED ORDER — LIDOCAINE HCL (PF) 1 % IJ SOLN
INTRAMUSCULAR | Status: AC
  Administered 2016-04-01: 2 mL
  Filled 2016-04-01: qty 5

## 2016-04-01 MED ORDER — PHENAZOPYRIDINE HCL 100 MG PO TABS
200.0000 mg | ORAL_TABLET | Freq: Once | ORAL | Status: AC
  Administered 2016-04-01: 200 mg via ORAL
  Filled 2016-04-01: qty 2

## 2016-04-01 MED ORDER — PHENAZOPYRIDINE HCL 200 MG PO TABS: 200.0000 mg | ORAL_TABLET | Freq: Three times a day (TID) | ORAL | 0 refills | Status: DC

## 2016-04-01 MED ORDER — FLUCONAZOLE 200 MG PO TABS: 200.0000 mg | ORAL_TABLET | Freq: Every day | ORAL | 0 refills | Status: AC

## 2016-04-01 MED ORDER — CEPHALEXIN 500 MG PO CAPS: 500.0000 mg | ORAL_CAPSULE | Freq: Four times a day (QID) | ORAL | 0 refills | Status: DC

## 2016-04-01 NOTE — ED Provider Notes (Signed)
[facility] DEPT Provider Note   CSN: [id number] Arrival date & time: 04/01/16  1125     History   Chief Complaint Chief Complaint  Patient presents with  . Urinary Frequency    HPI [name] is a 45 y.o. female with hx of DM and HTN presents to the ED with UTI symptoms and pelvic pain. Patient reports urinary frequency, urgency and dysuria.   The history is provided by the patient. No language interpreter was used.  Urinary Frequency  This is a new problem. The problem has been gradually worsening. Pertinent negatives include no chest pain, no headaches and no shortness of breath.    Past Medical History:  Diagnosis Date  . Anxiety   . BV (bacterial vaginosis)   . Depression   . Diabetes mellitus   . Seasonal allergies   . Trichomonas   . Yeast vaginitis     Patient Active Problem List   Diagnosis Date Noted  . Vaginal discharge 03/15/2016  . Sacroiliac joint dysfunction of both sides 11/17/2015  . Left hip pain 05/19/2015  . Contact dermatitis 05/19/2015  . Uterine fibroid 05/25/2014  . Endometrial polyp 05/25/2014  . Left knee pain 05/20/2014  . Amenorrhea 05/17/2014  . Right knee pain 10/11/2013  . Pelvic pain 03/31/2013  . Breast tenderness 03/31/2013  . Lump of right breast 09/10/2012  . HTN (hypertension) 09/08/2012  . Weight loss, unintentional 05/03/2012  . Abnormal vaginal bleeding 12/27/2011  . Menorrhagia 12/27/2011  . Diabetes type 2, controlled ([place]) 12/09/2011  . Acid reflux 11/19/2011  . Left facial numbness 09/23/2011  . Low back pain 08/23/2011  . Depression 08/08/2011  . Bipolar disorder ([place]) 02/28/2011  . Fatigue 02/26/2011  . Allergic rhinitis due to pollen 10/18/2010  . OBESITY 10/23/2009    Past Surgical History:  Procedure Laterality Date  . endometrial biopsy  2 in 2013   negative  . toe nail    . TUBAL LIGATION      OB History    Gravida Para Term Preterm AB Living   5 5 5     5    SAB TAB Ectopic Multiple  Live Births                   Home Medications    Prior to Admission medications   Medication Sig Start Date End Date Taking? Authorizing Provider  aspirin EC 81 MG EC tablet Take 1 tablet (81 mg total) by mouth daily. 09/07/12   Leone Haven, MD  cephALEXin (KEFLEX) 500 MG capsule Take 1 capsule (500 mg total) by mouth 4 (four) times daily. 04/01/16   Dale Ribeiro Bunnie Pion, NP  EPINEPHrine (EPIPEN) 0.3 mg/0.3 mL DEVI Inject 0.3 mLs (0.3 mg total) into the muscle as needed. 01/24/12   Teressa Lower, MD  fluconazole (DIFLUCAN) 200 MG tablet Take 1 tablet (200 mg total) by mouth daily. 04/01/16 04/08/16  Baltasar Twilley Bunnie Pion, NP  glipiZIDE (GLUCOTROL) 5 MG tablet TAKE 1 TABLET BY MOUTH DAILY 02/22/16   Archie Patten, MD  hydrocortisone cream 0.5 % Apply 1 application topically 2 (two) times daily. 05/19/15   Archie Patten, MD  ibuprofen (ADVIL,MOTRIN) 200 MG tablet Take 2 tablets (400 mg total) by mouth every 8 (eight) hours as needed for moderate pain. For 5 days, then as needed for pain after that 11/17/15   Patrecia Pour, MD  losartan (COZAAR) 50 MG tablet Take 1 tablet (50 mg total) by mouth daily. 10/20/15  Archie Patten, MD  Multiple Vitamin (MULITIVITAMIN WITH MINERALS) TABS Take 1 tablet by mouth daily.    Historical Provider, MD  Multiple Vitamins-Minerals (HAIR/SKIN/NAILS PO) Take 2 tablets by mouth 2 (two) times daily.    Historical Provider, MD  nitroGLYCERIN (NITROSTAT) 0.3 MG SL tablet Place 1 tablet (0.3 mg total) under the tongue every 5 (five) minutes as needed for chest pain. 09/07/12   Leone Haven, MD  ONE TOUCH ULTRA TEST test strip USE 1 STRIP TO CHECK SUGAR ONCE DAILY 11/27/15   Archie Patten, MD  phenazopyridine (PYRIDIUM) 200 MG tablet Take 1 tablet (200 mg total) by mouth 3 (three) times daily. 04/01/16   Kanoa Phillippi Bunnie Pion, NP    Family History Family History  Problem Relation Age of Onset  . Depression Brother   . Diabetes Brother   . Diabetes Mother   . Stroke Mother   .  Depression Sister   . Alcohol abuse Sister   . Diabetes Sister     Social History Social History  Substance Use Topics  . Smoking status: Former Smoker    Packs/day: 1.00    Years: 6.00    Types: Cigarettes    Quit date: 01/19/2014  . Smokeless tobacco: Never Used     Comment: used patches before  . Alcohol use No     Allergies   Apple; Shellfish allergy; and Wellbutrin [bupropion hcl]   Review of Systems Review of Systems  Constitutional: Positive for chills and fever.  HENT: Negative.   Eyes: Negative for pain, redness and visual disturbance.  Respiratory: Negative for cough, chest tightness and shortness of breath.   Cardiovascular: Negative for chest pain, palpitations and leg swelling.  Gastrointestinal: Positive for nausea and vomiting. Negative for diarrhea.  Genitourinary: Positive for dysuria, flank pain, frequency, pelvic pain, urgency and vaginal discharge.  Musculoskeletal: Positive for back pain.  Skin: Negative for rash and wound.  Neurological: Negative for syncope, light-headedness and headaches.  Psychiatric/Behavioral: Negative for confusion. The patient is not nervous/anxious.      Physical Exam Updated Vital Signs BP 121/72 (BP Location: Right Arm)   Pulse 71   Temp 98.4 F (36.9 C) (Oral)   Resp 18   Ht 5\' 4"  (1.626 m)   Wt 84.4 kg   SpO2 99%   BMI 31.93 kg/m   Physical Exam  Constitutional: She is oriented to person, place, and time. She appears well-developed and well-nourished. No distress.  Eyes: EOM are normal.  Neck: Neck supple.  Cardiovascular: Normal rate and regular rhythm.   Pulmonary/Chest: Effort normal and breath sounds normal. No respiratory distress.  Abdominal: Soft. Bowel sounds are normal. There is tenderness in the suprapubic area. There is no rebound, no guarding and no CVA tenderness.  Genitourinary:  Genitourinary Comments: External genitalia without lesions, white d/c vaginal vault, no CMT, mild bilateral adnexal  tenderness. Uterus without palpable enlargement.   Musculoskeletal: Normal range of motion.  Neurological: She is alert and oriented to person, place, and time. No cranial nerve deficit.  Skin: Skin is warm and dry.  Psychiatric: She has a normal mood and affect. Her behavior is normal.  Nursing note and vitals reviewed.    ED Treatments / Results  Labs (all labs ordered are listed, but only abnormal results are displayed) Labs Reviewed  WET PREP, GENITAL - Abnormal; Notable for the following:       Result Value   WBC, Wet Prep HPF POC MODERATE (*)    All  other components within normal limits  URINALYSIS, ROUTINE W REFLEX MICROSCOPIC (NOT AT [facility]) - Abnormal; Notable for the following:    APPearance CLOUDY (*)    Hgb urine dipstick TRACE (*)    Protein, ur 30 (*)    Leukocytes, UA LARGE (*)    All other components within normal limits  URINE MICROSCOPIC-ADD ON - Abnormal; Notable for the following:    Squamous Epithelial / LPF 6-30 (*)    All other components within normal limits  CBC WITH DIFFERENTIAL/PLATELET - Abnormal; Notable for the following:    HCT 35.7 (*)    All other components within normal limits  URINE CULTURE  BASIC METABOLIC PANEL  GC/CHLAMYDIA PROBE AMP ([place]) NOT AT [facility]    Radiology US Transvaginal Non-ob  Result Date: 04/01/2016 CLINICAL DATA:  Pelvic/lower back pain for 3 days. Prior tubal ligation and endometrial biopsy. EXAM: TRANSABDOMINAL AND TRANSVAGINAL ULTRASOUND OF PELVIS TECHNIQUE: Both transabdominal and transvaginal ultrasound examinations of the pelvis were performed. Transabdominal technique was performed for global imaging of the pelvis including uterus, ovaries, adnexal regions, and pelvic cul-de-sac. It was necessary to proceed with endovaginal exam following the transabdominal exam to visualize the endometrium and ovaries. COMPARISON:  05/24/2014 FINDINGS: Uterus Measurements: 8.1 x 4.0 x 5.4 cm. 10 x 12 x 10 mm intramural mass in the  anterior upper uterine segment, compatible with a fibroid. This was not discretely seen or measured on the prior ultrasound, however it was likely present as there was mild uterine contour abnormality in this same region which is similar to the current examination. The questioned subcentimeter fibroids on the prior study are not well seen today. Endometrium Thickness: 2 mm.  No focal abnormality visualized. Right ovary Measurements: 1.0 x 1.2 x 1.4 cm. Normal appearance/no adnexal mass. Left ovary Measurements: 2.2 x 1.7 x 1.9 cm. Normal appearance/no adnexal mass. Other findings No abnormal free fluid. IMPRESSION: 1. 12 mm intramural uterine fibroid. 2. Unremarkable appearance of the ovaries. Electronically Signed   By: Logan Bores M.D.   On: 04/01/2016 18:20   US Pelvis Complete  Result Date: 04/01/2016 CLINICAL DATA:  Pelvic/lower back pain for 3 days. Prior tubal ligation and endometrial biopsy. EXAM: TRANSABDOMINAL AND TRANSVAGINAL ULTRASOUND OF PELVIS TECHNIQUE: Both transabdominal and transvaginal ultrasound examinations of the pelvis were performed. Transabdominal technique was performed for global imaging of the pelvis including uterus, ovaries, adnexal regions, and pelvic cul-de-sac. It was necessary to proceed with endovaginal exam following the transabdominal exam to visualize the endometrium and ovaries. COMPARISON:  05/24/2014 FINDINGS: Uterus Measurements: 8.1 x 4.0 x 5.4 cm. 10 x 12 x 10 mm intramural mass in the anterior upper uterine segment, compatible with a fibroid. This was not discretely seen or measured on the prior ultrasound, however it was likely present as there was mild uterine contour abnormality in this same region which is similar to the current examination. The questioned subcentimeter fibroids on the prior study are not well seen today. Endometrium Thickness: 2 mm.  No focal abnormality visualized. Right ovary Measurements: 1.0 x 1.2 x 1.4 cm. Normal appearance/no adnexal mass.  Left ovary Measurements: 2.2 x 1.7 x 1.9 cm. Normal appearance/no adnexal mass. Other findings No abnormal free fluid. IMPRESSION: 1. 12 mm intramural uterine fibroid. 2. Unremarkable appearance of the ovaries. Electronically Signed   By: Logan Bores M.D.   On: 04/01/2016 18:20    Procedures Procedures (including critical care time)  Medications Ordered in ED Medications  phenazopyridine (PYRIDIUM) tablet 200 mg (200  mg Oral Given 04/01/16 1701)  cefTRIAXone (ROCEPHIN) injection 1 g (1 g Intramuscular Given 04/01/16 1815)  lidocaine (PF) (XYLOCAINE) 1 % injection (2 mLs  Given 04/01/16 1815)     Initial Impression / Assessment and Plan / ED Course  I have reviewed the triage vital signs and the nursing notes.  Pertinent labs & imaging results that were available during my care of the patient were reviewed by me and considered in my medical decision making (see chart for details).  Clinical Course  Pyridium Rocephin  Final Clinical Impressions(s) / ED Diagnoses  45 y.o. female with UTI symptoms and pelvic pain stable for d/c without fever, acute abdomen or signs of PID or pyelonephritis.  Patient does not appear toxic. Will treat for UTI and send urine for culture. Discussed with the patient and all questioned fully answered. She will f/u with her PCP or return if any problems arise.  Final diagnoses:  Pelvic pain in female  UTI (lower urinary tract infection)  Urinary frequency    New Prescriptions Discharge Medication List as of 04/01/2016  6:48 PM    START taking these medications   Details  cephALEXin (KEFLEX) 500 MG capsule Take 1 capsule (500 mg total) by mouth 4 (four) times daily., Starting Mon 04/01/2016, Print    fluconazole (DIFLUCAN) 200 MG tablet Take 1 tablet (200 mg total) by mouth daily., Starting Mon 04/01/2016, Until Mon 04/08/2016, Print    phenazopyridine (PYRIDIUM) 200 MG tablet Take 1 tablet (200 mg total) by mouth 3 (three) times daily., Starting Mon 04/01/2016,  [place], NP 04/01/16 2131    Merrily Pew, MD 04/02/16 [phone]

## 2016-04-01 NOTE — ED Triage Notes (Signed)
Per pt, Pt is coming from home with complaints of burning with urination and urinary frequency. Pt complains of lower back pain that all started three days ago. Reports chills and fever last night. Denies Vaginal discharge

## 2016-04-02 LAB — GC/CHLAMYDIA PROBE AMP (~~LOC~~) NOT AT ARMC
CHLAMYDIA, DNA PROBE: NEGATIVE
Neisseria Gonorrhea: NEGATIVE

## 2016-04-03 LAB — URINE CULTURE: Culture: >=100000 — AB

## 2016-04-04 ENCOUNTER — Telehealth (HOSPITAL_COMMUNITY): Payer: Self-pay

## 2016-04-04 NOTE — Telephone Encounter (Signed)
Post ED Visit - Positive Culture Follow-up  Culture report reviewed by antimicrobial stewardship pharmacist:  []  Elenor Quinones, Pharm.D. []  Heide Guile, Pharm.D., BCPS []  Parks Neptune, Pharm.D. []  Alycia Rossetti, Pharm.D., BCPS []  [place], Pharm.D., BCPS, AAHIVP []  Legrand Como, Pharm.D., BCPS, AAHIVP []  Milus Glazier, Pharm.D. []  Rob Evette Doffing, Pharm.D. Shanon Rosser, Pharm.D.  Positive urine culture, >/= 100,000 colonies -> E Coli Treated with Cephalexin, organism sensitive to the same and no further patient follow-up is required at this time.  Dortha Kern 04/04/2016, 4:44 PM

## 2016-05-05 DIAGNOSIS — R21 Rash and other nonspecific skin eruption: Secondary | ICD-10-CM | POA: Diagnosis not present

## 2016-05-05 DIAGNOSIS — F17200 Nicotine dependence, unspecified, uncomplicated: Secondary | ICD-10-CM | POA: Diagnosis not present

## 2016-05-05 DIAGNOSIS — T7840XA Allergy, unspecified, initial encounter: Secondary | ICD-10-CM | POA: Diagnosis not present

## 2016-05-25 ENCOUNTER — Other Ambulatory Visit: Payer: Self-pay | Admitting: Family Medicine

## 2016-06-28 ENCOUNTER — Other Ambulatory Visit: Payer: Self-pay | Admitting: Family Medicine

## 2016-06-28 ENCOUNTER — Telehealth: Payer: Self-pay | Admitting: Family Medicine

## 2016-06-28 ENCOUNTER — Ambulatory Visit (INDEPENDENT_AMBULATORY_CARE_PROVIDER_SITE_OTHER): Payer: Medicare Other | Admitting: Family Medicine

## 2016-06-28 ENCOUNTER — Encounter: Payer: Self-pay | Admitting: Family Medicine

## 2016-06-28 ENCOUNTER — Ambulatory Visit (HOSPITAL_COMMUNITY)
Admission: RE | Admit: 2016-06-28 | Discharge: 2016-06-28 | Disposition: A | Discharge status: Home / Self Care | Payer: Medicare Other | Source: Ambulatory Visit | Attending: Family Medicine | Admitting: Family Medicine

## 2016-06-28 VITALS — BP 104/64 | HR 77 | Temp 97.8°F | Wt 194.0 lb

## 2016-06-28 DIAGNOSIS — N390 Urinary tract infection, site not specified: Secondary | ICD-10-CM

## 2016-06-28 DIAGNOSIS — E119 Type 2 diabetes mellitus without complications: Secondary | ICD-10-CM

## 2016-06-28 DIAGNOSIS — M25562 Pain in left knee: Secondary | ICD-10-CM

## 2016-06-28 DIAGNOSIS — M5441 Lumbago with sciatica, right side: Secondary | ICD-10-CM | POA: Insufficient documentation

## 2016-06-28 DIAGNOSIS — M25561 Pain in right knee: Secondary | ICD-10-CM | POA: Diagnosis not present

## 2016-06-28 DIAGNOSIS — M545 Low back pain: Secondary | ICD-10-CM | POA: Diagnosis not present

## 2016-06-28 DIAGNOSIS — G8929 Other chronic pain: Secondary | ICD-10-CM | POA: Diagnosis not present

## 2016-06-28 DIAGNOSIS — M5442 Lumbago with sciatica, left side: Secondary | ICD-10-CM | POA: Diagnosis not present

## 2016-06-28 LAB — POCT URINALYSIS DIPSTICK
BILIRUBIN UA: NEGATIVE
Glucose, UA: NEGATIVE
KETONES UA: NEGATIVE
Leukocytes, UA: NEGATIVE
NITRITE UA: NEGATIVE
PH UA: 7
Protein, UA: NEGATIVE
RBC UA: NEGATIVE
Spec Grav, UA: 1.02
Urobilinogen, UA: 1

## 2016-06-28 LAB — POCT GLYCOSYLATED HEMOGLOBIN (HGB A1C): HEMOGLOBIN A1C: 6.6

## 2016-06-28 MED ORDER — NAPROXEN 500 MG PO TABS: 500.0000 mg | ORAL_TABLET | Freq: Two times a day (BID) | ORAL | 0 refills | Status: DC

## 2016-06-28 NOTE — Telephone Encounter (Signed)
I called and discussed her urine and xray result with her. No change in management plan. F/U as needed. All questions were answered.

## 2016-06-28 NOTE — Progress Notes (Signed)
Subjective:     Patient ID: Cathy Bond, female   DOB: Jul 21, 1971, 45 y.o.   MRN: [id number]  HPI Back/Knee pain: C/O lower back pain and B/L knee pain on going for years. She uses Ibuprofen as needed for pain with minimal improvement. Back pain is worsened by prolonged sitting and change in motion. Pain is aching in nature, about 7/10 in severity. She denies recent injury but in Oct of 2017 she had right knee dislocation. Otherwise, no loss of function of her extremities. Note back pain radiates down her lower limbs occasionally. She is worried she has cancer since her grand father had bone cancer. UTI: She started the last time she was seen she was told she has urine infection and would like to get it checked. She denies any GU symptoms.  Current Outpatient Prescriptions on File Prior to Visit  Medication Sig Dispense Refill  . aspirin EC 81 MG EC tablet Take 1 tablet (81 mg total) by mouth daily. 90 tablet 3  . cephALEXin (KEFLEX) 500 MG capsule Take 1 capsule (500 mg total) by mouth 4 (four) times daily. 28 capsule 0  . EPINEPHrine (EPIPEN) 0.3 mg/0.3 mL DEVI Inject 0.3 mLs (0.3 mg total) into the muscle as needed. 1 Device 1  . glipiZIDE (GLUCOTROL) 5 MG tablet TAKE 1 TABLET BY MOUTH DAILY 90 tablet 0  . hydrocortisone cream 0.5 % Apply 1 application topically 2 (two) times daily. 30 g 0  . ibuprofen (ADVIL,MOTRIN) 200 MG tablet Take 2 tablets (400 mg total) by mouth every 8 (eight) hours as needed for moderate pain. For 5 days, then as needed for pain after that 60 tablet 0  . losartan (COZAAR) 50 MG tablet Take 1 tablet (50 mg total) by mouth daily. 90 tablet 3  . Multiple Vitamin (MULITIVITAMIN WITH MINERALS) TABS Take 1 tablet by mouth daily.    . Multiple Vitamins-Minerals (HAIR/SKIN/NAILS PO) Take 2 tablets by mouth 2 (two) times daily.    . nitroGLYCERIN (NITROSTAT) 0.3 MG SL tablet Place 1 tablet (0.3 mg total) under the tongue every 5 (five) minutes as needed for chest pain. 10  tablet 0  . ONE TOUCH ULTRA TEST test strip USE 1 STRIP TO CHECK SUGAR ONCE DAILY 100 each 0  . phenazopyridine (PYRIDIUM) 200 MG tablet Take 1 tablet (200 mg total) by mouth 3 (three) times daily. 6 tablet 0   No current facility-administered medications on file prior to visit.    Past Medical History:  Diagnosis Date  . Anxiety   . BV (bacterial vaginosis)   . Depression   . Diabetes mellitus   . Seasonal allergies   . Trichomonas   . Yeast vaginitis      Review of Systems  Constitutional: Negative.   Respiratory: Negative.   Cardiovascular: Negative.   Musculoskeletal: Positive for arthralgias and back pain. Negative for joint swelling.  All other systems reviewed and are negative.      Vitals:   06/28/16 1110  BP: 104/64  Pulse: 77  Temp: 97.8 F (36.6 C)  TempSrc: Oral  SpO2: 99%  Weight: 194 lb (88 kg)   Urinalysis    Component Value Date/Time   COLORURINE YELLOW 04/01/2016 1146   APPEARANCEUR CLOUDY (A) 04/01/2016 1146   LABSPEC 1.015 04/01/2016 1146   PHURINE 8.0 04/01/2016 1146   GLUCOSEU NEGATIVE 04/01/2016 1146   HGBUR TRACE (A) 04/01/2016 1146   BILIRUBINUR NEG 06/28/2016 1000   KETONESUR NEGATIVE 04/01/2016 1146   PROTEINUR NEG 06/28/2016  1000   PROTEINUR 30 (A) 04/01/2016 1146   UROBILINOGEN 1.0 06/28/2016 1000   UROBILINOGEN 0.2 02/02/2014 0853   NITRITE NEG 06/28/2016 1000   NITRITE NEGATIVE 04/01/2016 1146   LEUKOCYTESUR Negative 06/28/2016 1000    Dg Lumbar Spine Complete  Result Date: 06/28/2016 CLINICAL DATA:  Low back pain, bilateral sciatica EXAM: LUMBAR SPINE - COMPLETE 4+ VIEW COMPARISON:  03/02/2013 FINDINGS: Five views of the lumbar spine submitted. No acute fracture or subluxation. Mild anterior spurring noted upper and lower endplate of L2 and L3 vertebral body. Alignment, disc spaces and vertebral body heights are preserved. IMPRESSION: No acute fracture or subluxation.  Minimal degenerative changes. Electronically Signed   By:  Lahoma Crocker M.D.   On: 06/28/2016 14:40    Objective:   Physical Exam  Constitutional: She appears well-developed. No distress.  Cardiovascular: Normal rate, regular rhythm, normal heart sounds and intact distal pulses.   No murmur heard. Pulmonary/Chest: Effort normal and breath sounds normal. No respiratory distress. She has no wheezes. She exhibits no tenderness.  Abdominal: Soft. Bowel sounds are normal. She exhibits no distension and no mass. There is no tenderness.  Musculoskeletal: Normal range of motion. She exhibits no edema.       Right knee: She exhibits normal range of motion, no swelling, no effusion and no deformity. No tenderness found.       Left knee: She exhibits normal range of motion, no swelling, no effusion and no deformity. No tenderness found.       Lumbar back: Normal. She exhibits normal range of motion, no tenderness and no swelling.  Neg straight leg raising test  Nursing note and vitals reviewed.      Assessment:     Back pain ( Lumbar spine): With Radiculopathy B/L Knee pain UTI    Plan:     1. No neurologic deficit on exam.     Exam done given her concern for cancer.     Xray report suggestive of DJD.    Naproxen prescribed.    PT in the future if no improvement.  2. Knee exam benign.     Likely DJD.     Naproxen prescribed prn pain.    To obtain xray in the future if no improvement.  3. UA neg for infection.     No treatment needed.    Note: A1C checked =6.6. Result discussed with patient and I forwarded a copy to her PCP. Advised her to see PCP soon for DM management and recommendation based on the A1C result. She agreed with plan.

## 2016-06-28 NOTE — Patient Instructions (Signed)
Back Exercises Introduction If you have pain in your back, do these exercises 2-3 times each day or as told by your doctor. When the pain goes away, do the exercises once each day, but repeat the steps more times for each exercise (do more repetitions). If you do not have pain in your back, do these exercises once each day or as told by your doctor. Exercises Single Knee to Chest  Do these steps 3-5 times in a row for each leg: 1. Lie on your back on a firm bed or the floor with your legs stretched out. 2. Bring one knee to your chest. 3. Hold your knee to your chest by grabbing your knee or thigh. 4. Pull on your knee until you feel a gentle stretch in your lower back. 5. Keep doing the stretch for 10-30 seconds. 6. Slowly let go of your leg and straighten it. Pelvic Tilt  Do these steps 5-10 times in a row: 1. Lie on your back on a firm bed or the floor with your legs stretched out. 2. Bend your knees so they point up to the ceiling. Your feet should be flat on the floor. 3. Tighten your lower belly (abdomen) muscles to press your lower back against the floor. This will make your tailbone point up to the ceiling instead of pointing down to your feet or the floor. 4. Stay in this position for 5-10 seconds while you gently tighten your muscles and breathe evenly. Cat-Cow  Do these steps until your lower back bends more easily: 1. Get on your hands and knees on a firm surface. Keep your hands under your shoulders, and keep your knees under your hips. You may put padding under your knees. 2. Let your head hang down, and make your tailbone point down to the floor so your lower back is round like the back of a cat. 3. Stay in this position for 5 seconds. 4. Slowly lift your head and make your tailbone point up to the ceiling so your back hangs low (sags) like the back of a cow. 5. Stay in this position for 5 seconds. Press-Ups  Do these steps 5-10 times in a row: 1. Lie on your belly  (face-down) on the floor. 2. Place your hands near your head, about shoulder-width apart. 3. While you keep your back relaxed and keep your hips on the floor, slowly straighten your arms to raise the top half of your body and lift your shoulders. Do not use your back muscles. To make yourself more comfortable, you may change where you place your hands. 4. Stay in this position for 5 seconds. 5. Slowly return to lying flat on the floor. Bridges  Do these steps 10 times in a row: 1. Lie on your back on a firm surface. 2. Bend your knees so they point up to the ceiling. Your feet should be flat on the floor. 3. Tighten your butt muscles and lift your butt off of the floor until your waist is almost as high as your knees. If you do not feel the muscles working in your butt and the back of your thighs, slide your feet 1-2 inches farther away from your butt. 4. Stay in this position for 3-5 seconds. 5. Slowly lower your butt to the floor, and let your butt muscles relax. If this exercise is too easy, try doing it with your arms crossed over your chest. Belly Crunches  Do these steps 5-10 times in a row: 1. Lie   on your back on a firm bed or the floor with your legs stretched out. 2. Bend your knees so they point up to the ceiling. Your feet should be flat on the floor. 3. Cross your arms over your chest. 4. Tip your chin a little bit toward your chest but do not bend your neck. 5. Tighten your belly muscles and slowly raise your chest just enough to lift your shoulder blades a tiny bit off of the floor. 6. Slowly lower your chest and your head to the floor. Back Lifts  Do these steps 5-10 times in a row: 1. Lie on your belly (face-down) with your arms at your sides, and rest your forehead on the floor. 2. Tighten the muscles in your legs and your butt. 3. Slowly lift your chest off of the floor while you keep your hips on the floor. Keep the back of your head in line with the curve in your back.  Look at the floor while you do this. 4. Stay in this position for 3-5 seconds. 5. Slowly lower your chest and your face to the floor. Contact a doctor if:  Your back pain gets a lot worse when you do an exercise.  Your back pain does not lessen 2 hours after you exercise. If you have any of these problems, stop doing the exercises. Do not do them again unless your doctor says it is okay. Get help right away if:  You have sudden, very bad back pain. If this happens, stop doing the exercises. Do not do them again unless your doctor says it is okay. This information is not intended to replace advice given to you by your health care provider. Make sure you discuss any questions you have with your health care provider. Document Released: 08/10/2010 Document Revised: 12/14/2015 Document Reviewed: 09/01/2014  2017 Elsevier  

## 2016-07-30 ENCOUNTER — Other Ambulatory Visit: Payer: Self-pay | Admitting: Family Medicine

## 2016-09-22 ENCOUNTER — Other Ambulatory Visit: Payer: Self-pay | Admitting: Family Medicine

## 2016-11-21 ENCOUNTER — Other Ambulatory Visit: Payer: Self-pay | Admitting: Family Medicine

## 2017-01-05 ENCOUNTER — Other Ambulatory Visit: Payer: Self-pay | Admitting: Family Medicine

## 2017-01-06 ENCOUNTER — Other Ambulatory Visit: Payer: Self-pay | Admitting: Family Medicine

## 2017-01-06 NOTE — Telephone Encounter (Signed)
Refilled losartan x 1 month supply. Pt needs an appt.  Thanks, Archie Patten, MD [facility] Family Medicine Resident  01/06/2017, 8:40 AM

## 2017-01-07 NOTE — Telephone Encounter (Signed)
Called patient x 2, no answer, no voicemail. 

## 2017-01-09 ENCOUNTER — Other Ambulatory Visit: Payer: Self-pay | Admitting: Family Medicine

## 2017-01-29 DIAGNOSIS — S0990XA Unspecified injury of head, initial encounter: Secondary | ICD-10-CM | POA: Diagnosis not present

## 2017-01-29 DIAGNOSIS — R509 Fever, unspecified: Secondary | ICD-10-CM | POA: Diagnosis not present

## 2017-01-29 DIAGNOSIS — E119 Type 2 diabetes mellitus without complications: Secondary | ICD-10-CM | POA: Diagnosis not present

## 2017-01-29 DIAGNOSIS — E669 Obesity, unspecified: Secondary | ICD-10-CM | POA: Diagnosis not present

## 2017-01-29 DIAGNOSIS — E860 Dehydration: Secondary | ICD-10-CM | POA: Diagnosis not present

## 2017-01-29 DIAGNOSIS — F17210 Nicotine dependence, cigarettes, uncomplicated: Secondary | ICD-10-CM | POA: Diagnosis not present

## 2017-01-29 DIAGNOSIS — I10 Essential (primary) hypertension: Secondary | ICD-10-CM | POA: Diagnosis not present

## 2017-01-29 DIAGNOSIS — R531 Weakness: Secondary | ICD-10-CM | POA: Diagnosis not present

## 2017-02-25 ENCOUNTER — Other Ambulatory Visit: Payer: Self-pay | Admitting: Internal Medicine

## 2017-02-25 MED ORDER — GLIPIZIDE 5 MG PO TABS: 5.0000 mg | ORAL_TABLET | Freq: Every day | ORAL | 0 refills | Status: DC

## 2017-02-25 NOTE — Telephone Encounter (Signed)
Pt calling to request refill of:  Name of Medication(s):  glipizde Last date of OV:  06-28-16 Pharmacy:  [facility]   Will route refill request to Clinic RN.  Discussed with patient policy to call pharmacy for future refills.  Also, discussed refills may take up to 48 hours to approve or deny.  Renella Cunas

## 2017-02-25 NOTE — Telephone Encounter (Signed)
Patient is out of medication.  Tyauna Lacaze L, RN  

## 2017-02-26 NOTE — Telephone Encounter (Addendum)
Patient needs to come in for diabetes check in 1 month. Filled medication

## 2017-02-26 NOTE — Telephone Encounter (Signed)
Called number in chart, was informed I had the wrong number.

## 2017-03-17 ENCOUNTER — Other Ambulatory Visit: Payer: Self-pay | Admitting: *Deleted

## 2017-03-17 MED ORDER — LOSARTAN POTASSIUM 50 MG PO TABS: ORAL_TABLET | ORAL | 0 refills | Status: DC

## 2017-03-17 NOTE — Telephone Encounter (Signed)
Patient calls triage line.  States that her pharmacy told her that we have not responded to their request of losartan yet.  Advised to patient that we do not have an electronic request in her system.  They may have sent a paper request but advised pt that it is quicker and easier to send an eletronic request.  Pt is understanding.  Advised that she has not been seen since December so her provider would want to check in with her.   She is agreeable and appt made for 03/25/17 @ 4:10.    Refill request sent to Dr. Emmaline Life. Fleeger, Salome Spotted, CMA

## 2017-03-20 ENCOUNTER — Other Ambulatory Visit: Payer: Self-pay | Admitting: *Deleted

## 2017-03-20 MED ORDER — LOSARTAN POTASSIUM 50 MG PO TABS: ORAL_TABLET | ORAL | 0 refills | Status: DC

## 2017-03-20 NOTE — Progress Notes (Unsigned)
Medication was sent to wrong pharmacy.  Derl Barrow, RN

## 2017-03-25 ENCOUNTER — Encounter: Payer: Self-pay | Admitting: Internal Medicine

## 2017-03-25 ENCOUNTER — Ambulatory Visit (INDEPENDENT_AMBULATORY_CARE_PROVIDER_SITE_OTHER): Payer: Medicare Other | Admitting: Internal Medicine

## 2017-03-25 VITALS — BP 124/70 | HR 84 | Temp 98.2°F | Ht 64.0 in | Wt 201.0 lb

## 2017-03-25 DIAGNOSIS — E119 Type 2 diabetes mellitus without complications: Secondary | ICD-10-CM | POA: Diagnosis not present

## 2017-03-25 DIAGNOSIS — I10 Essential (primary) hypertension: Secondary | ICD-10-CM

## 2017-03-25 LAB — POCT GLYCOSYLATED HEMOGLOBIN (HGB A1C): Hemoglobin A1C: 6.9

## 2017-03-25 MED ORDER — ONETOUCH LANCETS MISC: 2 refills | Status: DC

## 2017-03-25 MED ORDER — EMPAGLIFLOZIN 10 MG PO TABS: 10.0000 mg | ORAL_TABLET | Freq: Every day | ORAL | 0 refills | Status: DC

## 2017-03-25 NOTE — Progress Notes (Signed)
   [facility] Kerrin Mo, MD Phone: [phone]  Reason For Visit:   # CHRONIC DM, Type 2: Has been on glipizide for about 4-5 years. Metformin made patient itch real bad Reports good compliance. Tolerating well w/o side-effects Currently on ACEi / ARB  Lifestyle: as below  Any hypoglycemia episodes: Denies palpations, diaphoresis, fatigue, weakness, jittery Denies polyuria, visual changes, numbness or tingling.  #CHRONIC HTN: Reports no issues  Current Meds -coozar  Reports good compliance, took meds today. Tolerating well, w/o complaints. Lifestyle - Exercise - nothing, paint. Eating fried fruit, sweet tea  Denies CP, dyspnea, HA, edema, dizziness / lightheadedness  Past Medical History Reviewed problem list.  Medications- reviewed and updated No additions to family history Social history- patient is a non-smoker  Objective: BP 124/70   Pulse 84   Temp 98.2 F (36.8 C) (Oral)   Ht 5\' 4"  (1.626 m)   Wt 201 lb (91.2 kg)   LMP 03/25/2012   SpO2 97%   BMI 34.50 kg/m  Gen: NAD, alert, cooperative with exam Cardio: regular rate and rhythm, S1S2 heard, no murmurs appreciated Pulm: clear to auscultation bilaterally, no wheezes, rhonchi or rales Extremities: warm, well perfused, No edema, cyanosis or clubbing;    Diabetic Foot Exam - Simple   Simple Foot Form Diabetic Foot exam was performed with the following findings:  Yes 03/25/2017  4:30 PM  Visual Inspection No deformities, no ulcerations, no other skin breakdown bilaterally:  Yes Sensation Testing Intact to touch and monofilament testing bilaterally:  Yes Pulse Check Posterior Tibialis and Dorsalis pulse intact bilaterally:  Yes Comments    Assessment/Plan: See problem based a/p  HTN (hypertension) Well controlled, no changes required  Obtain BMET   Diabetes type 2, controlled A1C trending up currently 6.9.  Discussed change diabetic regiment from Glipizide to Jardiance.   Patient in agreement. Discussed side effects  Stop Glipizide,start Jardiance  Stop sugary drinks.  Performed foot exam  Placed referral to opthalmology  Obtain lipid panel to determine ASCVD risk

## 2017-03-25 NOTE — Patient Instructions (Addendum)
Please start Jardiance and stop glipizide. I think it is important for you to try to stop sweet teas. Please follow up in two months.

## 2017-03-26 LAB — LIPID PANEL
Chol/HDL Ratio: 2.8 ratio (ref 0.0–4.4)
Cholesterol, Total: 147 mg/dL (ref 100–199)
HDL: 52 mg/dL (ref >39)
LDL Calculated: 76 mg/dL (ref 0–99)
Triglycerides: 97 mg/dL (ref 0–149)
VLDL CHOLESTEROL CAL: 19 mg/dL (ref 5–40)

## 2017-03-26 LAB — BASIC METABOLIC PANEL
BUN/Creatinine Ratio: 14 (ref 9–23)
BUN: 12 mg/dL (ref 6–24)
CALCIUM: 9.1 mg/dL (ref 8.7–10.2)
CO2: 25 mmol/L (ref 20–29)
CREATININE: 0.83 mg/dL (ref 0.57–1.00)
Chloride: 101 mmol/L (ref 96–106)
GFR calc Af Amer: 97 mL/min/{1.73_m2} (ref >59)
GFR calc non Af Amer: 84 mL/min/{1.73_m2} (ref >59)
GLUCOSE: 128 mg/dL — AB (ref 65–99)
Potassium: 4.1 mmol/L (ref 3.5–5.2)
SODIUM: 138 mmol/L (ref 134–144)

## 2017-03-27 ENCOUNTER — Encounter: Payer: Self-pay | Admitting: Internal Medicine

## 2017-03-27 NOTE — Assessment & Plan Note (Addendum)
A1C trending up currently 6.9.  Discussed change diabetic regiment from Glipizide to Jardiance.  Patient in agreement. Discussed side effects  Stop Glipizide,start Jardiance  Stop sugary drinks.  Performed foot exam  Placed referral to opthalmology  Obtain lipid panel to determine ASCVD risk

## 2017-03-27 NOTE — Assessment & Plan Note (Addendum)
Well controlled, no changes required  Obtain BMET

## 2017-04-23 ENCOUNTER — Other Ambulatory Visit: Payer: Self-pay | Admitting: Internal Medicine

## 2017-04-23 DIAGNOSIS — E119 Type 2 diabetes mellitus without complications: Secondary | ICD-10-CM

## 2017-05-23 ENCOUNTER — Other Ambulatory Visit: Payer: Self-pay | Admitting: Internal Medicine

## 2017-05-23 DIAGNOSIS — E119 Type 2 diabetes mellitus without complications: Secondary | ICD-10-CM

## 2017-05-25 ENCOUNTER — Other Ambulatory Visit: Payer: Self-pay | Admitting: Internal Medicine

## 2017-05-26 NOTE — Telephone Encounter (Signed)
Patient called again asking for refills on DM meds. Hubbard Hartshorn, RN, BSN

## 2017-05-27 ENCOUNTER — Other Ambulatory Visit: Payer: Self-pay | Admitting: Internal Medicine

## 2017-05-27 DIAGNOSIS — E119 Type 2 diabetes mellitus without complications: Secondary | ICD-10-CM

## 2017-05-27 MED ORDER — EMPAGLIFLOZIN 10 MG PO TABS: 10.0000 mg | ORAL_TABLET | Freq: Every day | ORAL | 2 refills | Status: DC

## 2017-06-19 ENCOUNTER — Other Ambulatory Visit: Payer: Self-pay | Admitting: Internal Medicine

## 2017-09-17 ENCOUNTER — Other Ambulatory Visit: Payer: Self-pay | Admitting: Internal Medicine

## 2017-09-20 ENCOUNTER — Other Ambulatory Visit: Payer: Self-pay | Admitting: Internal Medicine

## 2017-09-20 DIAGNOSIS — E119 Type 2 diabetes mellitus without complications: Secondary | ICD-10-CM

## 2017-09-23 ENCOUNTER — Other Ambulatory Visit: Payer: Self-pay | Admitting: Internal Medicine

## 2017-09-23 DIAGNOSIS — E119 Type 2 diabetes mellitus without complications: Secondary | ICD-10-CM

## 2017-11-20 ENCOUNTER — Other Ambulatory Visit: Payer: Self-pay | Admitting: Internal Medicine

## 2017-11-20 DIAGNOSIS — E119 Type 2 diabetes mellitus without complications: Secondary | ICD-10-CM

## 2017-12-13 ENCOUNTER — Other Ambulatory Visit: Payer: Self-pay | Admitting: Internal Medicine

## 2017-12-22 ENCOUNTER — Other Ambulatory Visit: Payer: Self-pay | Admitting: Internal Medicine

## 2017-12-22 ENCOUNTER — Encounter: Payer: Self-pay | Admitting: Internal Medicine

## 2017-12-22 ENCOUNTER — Ambulatory Visit (INDEPENDENT_AMBULATORY_CARE_PROVIDER_SITE_OTHER): Payer: Medicare Other | Admitting: Internal Medicine

## 2017-12-22 VITALS — BP 140/80 | HR 64 | Temp 98.6°F | Ht 64.0 in | Wt 190.2 lb

## 2017-12-22 DIAGNOSIS — E119 Type 2 diabetes mellitus without complications: Secondary | ICD-10-CM

## 2017-12-22 DIAGNOSIS — M549 Dorsalgia, unspecified: Secondary | ICD-10-CM | POA: Diagnosis present

## 2017-12-22 DIAGNOSIS — I10 Essential (primary) hypertension: Secondary | ICD-10-CM | POA: Diagnosis not present

## 2017-12-22 LAB — POCT GLYCOSYLATED HEMOGLOBIN (HGB A1C): HBA1C, POC (CONTROLLED DIABETIC RANGE): 6.9 % (ref 0.0–7.0)

## 2017-12-22 MED ORDER — MELOXICAM 15 MG PO TABS: 15.0000 mg | ORAL_TABLET | Freq: Every day | ORAL | 0 refills | Status: DC

## 2017-12-22 MED ORDER — KETOROLAC TROMETHAMINE 30 MG/ML IJ SOLN
30.0000 mg | Freq: Once | INTRAMUSCULAR | Status: AC
  Administered 2017-12-22: 30 mg via INTRAMUSCULAR

## 2017-12-22 NOTE — Assessment & Plan Note (Signed)
A1C 6.9  Continue Jardiance  BMET and CBC  Follow up in 6 months

## 2017-12-22 NOTE — Patient Instructions (Addendum)
Please make an appointment for a pap smear as soon as possible. I am going to prescribe a medication for your back pain to take for about two weeks and you can take tylenol for breakthrough pain. I am going to do some general blood work.

## 2017-12-22 NOTE — Assessment & Plan Note (Signed)
Sacroiliac pain, patient has been doing a lot of lifting lately. Discussed stopping this  - Provided patient with Mobic daily for two weeks  - ketorolac (TORADOL) 30 MG/ML injection 30 mg today  - Follow up in two weeks if no improvement

## 2017-12-22 NOTE — Assessment & Plan Note (Signed)
Blood pressure slightly elevated  Has not taken blood pressure medication today  Obtain BMET  Follow up in 6 months

## 2017-12-22 NOTE — Progress Notes (Signed)
   [facility] Kerrin Mo, MD Phone: [phone]  Reason For Visit: Follow up   # Low Back Pain Patient has low back pain for about 1 week.States that does not radiate. Taking motrin for the pain. 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 Has a long hx of back pain   History of cancer: None  Weak immune system: None  History of IV drug use: None  History of steroid use: None   Symptoms Incontinence of bowel or bladder: None  Numbness of leg: None  Fever: None  Rest or Night pain: pain at night, has to lay on her side  Weight Loss:  None   # CHRONIC DM, Type 2: Meds: Jardiance  Reports  good compliance. Tolerating well w/o side-effects Currently on ACEi / ARB  Lifestyle: Exercising daily -walking,  has stopped smoking Denies polyuria, visual changes, numbness or tingling  #CHRONIC HTN: Current Meds - Losartan  Reports good compliance, did not take this morning. Tolerating well, w/o complaints. Lifestyle - as above, Denies CP, dyspnea, HA, edema, dizziness / lightheadedness  Past Medical History Reviewed problem list.  Medications- reviewed and updated No additions to family history Social history- patient is a non smoker  Objective: BP 140/80   Pulse 64   Temp 98.6 F (37 C) (Oral)   Ht 5\' 4"  (1.626 m)   Wt 190 lb 3.2 oz (86.3 kg)   SpO2 99%   BMI 32.65 kg/m  Gen: NAD, alert, cooperative with exam Cardio: regular rate and rhythm, S1S2 heard, no murmurs appreciated Pulm: clear to auscultation bilaterally, no wheezes, rhonchi or rales Skin: dry, intact, no rashes or lesions  Assessment/Plan: See problem based a/p  Diabetes type 2, controlled A1C 6.9  Continue Jardiance  BMET and CBC  Follow up in 6 months   HTN (hypertension) Blood pressure slightly elevated  Has not taken blood pressure medication today  Obtain BMET  Follow up in 6 months  Back pain Sacroiliac pain, patient has been doing a lot of lifting  lately. Discussed stopping this  - Provided patient with Mobic daily for two weeks  - ketorolac (TORADOL) 30 MG/ML injection 30 mg today  - Follow up in two weeks if no improvement

## 2017-12-23 LAB — CBC
Hematocrit: 36.1 % (ref 34.0–46.6)
Hemoglobin: 12.9 g/dL (ref 11.1–15.9)
MCH: 28.5 pg (ref 26.6–33.0)
MCHC: 35.7 g/dL (ref 31.5–35.7)
MCV: 80 fL (ref 79–97)
PLATELETS: 303 10*3/uL (ref 150–450)
RBC: 4.52 x10E6/uL (ref 3.77–5.28)
RDW: 14.8 % (ref 12.3–15.4)
WBC: 4.7 10*3/uL (ref 3.4–10.8)

## 2017-12-23 LAB — BASIC METABOLIC PANEL
BUN/Creatinine Ratio: 16 (ref 9–23)
BUN: 14 mg/dL (ref 6–24)
CALCIUM: 9.6 mg/dL (ref 8.7–10.2)
CHLORIDE: 102 mmol/L (ref 96–106)
CO2: 25 mmol/L (ref 20–29)
Creatinine, Ser: 0.87 mg/dL (ref 0.57–1.00)
GFR calc non Af Amer: 79 mL/min/{1.73_m2} (ref >59)
GFR, EST AFRICAN AMERICAN: 91 mL/min/{1.73_m2} (ref >59)
GLUCOSE: 121 mg/dL — AB (ref 65–99)
POTASSIUM: 4.3 mmol/L (ref 3.5–5.2)
Sodium: 140 mmol/L (ref 134–144)

## 2017-12-24 ENCOUNTER — Encounter: Payer: Self-pay | Admitting: Internal Medicine

## 2018-01-20 ENCOUNTER — Other Ambulatory Visit: Payer: Self-pay | Admitting: Internal Medicine

## 2018-01-20 DIAGNOSIS — E119 Type 2 diabetes mellitus without complications: Secondary | ICD-10-CM

## 2018-02-09 DIAGNOSIS — S39012A Strain of muscle, fascia and tendon of lower back, initial encounter: Secondary | ICD-10-CM | POA: Diagnosis not present

## 2018-02-09 DIAGNOSIS — M25562 Pain in left knee: Secondary | ICD-10-CM | POA: Diagnosis not present

## 2018-02-09 DIAGNOSIS — R32 Unspecified urinary incontinence: Secondary | ICD-10-CM | POA: Diagnosis not present

## 2018-02-09 DIAGNOSIS — M545 Low back pain: Secondary | ICD-10-CM | POA: Diagnosis not present

## 2018-02-09 DIAGNOSIS — S8392XA Sprain of unspecified site of left knee, initial encounter: Secondary | ICD-10-CM | POA: Diagnosis not present

## 2018-02-09 DIAGNOSIS — F17210 Nicotine dependence, cigarettes, uncomplicated: Secondary | ICD-10-CM | POA: Diagnosis not present

## 2018-02-15 ENCOUNTER — Other Ambulatory Visit: Payer: Self-pay | Admitting: Family Medicine

## 2018-02-15 DIAGNOSIS — E119 Type 2 diabetes mellitus without complications: Secondary | ICD-10-CM

## 2018-03-14 ENCOUNTER — Other Ambulatory Visit: Payer: Self-pay | Admitting: Internal Medicine

## 2018-03-18 ENCOUNTER — Other Ambulatory Visit: Payer: Self-pay | Admitting: Family Medicine

## 2018-03-18 DIAGNOSIS — E119 Type 2 diabetes mellitus without complications: Secondary | ICD-10-CM

## 2018-04-01 DIAGNOSIS — T7840XA Allergy, unspecified, initial encounter: Secondary | ICD-10-CM | POA: Diagnosis not present

## 2018-04-18 ENCOUNTER — Other Ambulatory Visit: Payer: Self-pay | Admitting: Family Medicine

## 2018-04-18 DIAGNOSIS — E119 Type 2 diabetes mellitus without complications: Secondary | ICD-10-CM

## 2018-05-22 ENCOUNTER — Other Ambulatory Visit: Payer: Self-pay | Admitting: Family Medicine

## 2018-05-22 DIAGNOSIS — E119 Type 2 diabetes mellitus without complications: Secondary | ICD-10-CM

## 2018-06-09 ENCOUNTER — Other Ambulatory Visit: Payer: Self-pay | Admitting: Family Medicine

## 2018-06-21 ENCOUNTER — Other Ambulatory Visit: Payer: Self-pay | Admitting: Family Medicine

## 2018-06-21 DIAGNOSIS — E119 Type 2 diabetes mellitus without complications: Secondary | ICD-10-CM

## 2018-07-20 ENCOUNTER — Other Ambulatory Visit: Payer: Self-pay | Admitting: Family Medicine

## 2018-07-20 DIAGNOSIS — E119 Type 2 diabetes mellitus without complications: Secondary | ICD-10-CM

## 2018-08-17 ENCOUNTER — Other Ambulatory Visit: Payer: Self-pay | Admitting: Family Medicine

## 2018-08-17 DIAGNOSIS — E119 Type 2 diabetes mellitus without complications: Secondary | ICD-10-CM

## 2018-09-01 ENCOUNTER — Other Ambulatory Visit: Payer: Self-pay | Admitting: Family Medicine

## 2018-09-09 ENCOUNTER — Other Ambulatory Visit: Payer: Self-pay | Admitting: Family Medicine

## 2018-09-09 ENCOUNTER — Encounter: Payer: Medicare Other | Admitting: Family Medicine

## 2018-09-09 DIAGNOSIS — E119 Type 2 diabetes mellitus without complications: Secondary | ICD-10-CM

## 2018-09-21 ENCOUNTER — Ambulatory Visit (INDEPENDENT_AMBULATORY_CARE_PROVIDER_SITE_OTHER): Payer: Medicare Other | Admitting: Family Medicine

## 2018-09-21 ENCOUNTER — Other Ambulatory Visit (HOSPITAL_COMMUNITY)
Admission: RE | Admit: 2018-09-21 | Discharge: 2018-09-21 | Disposition: A | Discharge status: Home / Self Care | Payer: Medicare Other | Source: Ambulatory Visit | Attending: Family Medicine | Admitting: Family Medicine

## 2018-09-21 VITALS — BP 120/70 | HR 70 | Temp 98.3°F | Wt 204.2 lb

## 2018-09-21 DIAGNOSIS — E119 Type 2 diabetes mellitus without complications: Secondary | ICD-10-CM | POA: Diagnosis not present

## 2018-09-21 DIAGNOSIS — Z0000 Encounter for general adult medical examination without abnormal findings: Secondary | ICD-10-CM

## 2018-09-21 DIAGNOSIS — B351 Tinea unguium: Secondary | ICD-10-CM

## 2018-09-21 DIAGNOSIS — Z01419 Encounter for gynecological examination (general) (routine) without abnormal findings: Secondary | ICD-10-CM | POA: Insufficient documentation

## 2018-09-21 LAB — POCT GLYCOSYLATED HEMOGLOBIN (HGB A1C): HbA1c, POC (controlled diabetic range): 8.7 % — AB (ref 0.0–7.0)

## 2018-09-21 MED ORDER — NAPROXEN 500 MG PO TABS: ORAL_TABLET | ORAL | 0 refills | Status: DC

## 2018-09-21 MED ORDER — ONETOUCH ULTRASOFT LANCETS MISC: 12 refills | Status: DC

## 2018-09-21 NOTE — Patient Instructions (Signed)
It was great meeting you today!  Today we performed a Pap smear.  I will give a call with results.  Fortunately lab was pretty backed up so we will get your A1c on the way out.  I will also call and discuss these results with you.  Your nail overgrowth is likely due to a fungal infection.  Usually not much to do for these aside from routine filing to keep them small enough to manage.  Aside from A1c no lab work is necessary today.  For your runny nose you likely had a viral URI.  This will eventually resolve on its own with time.

## 2018-09-24 ENCOUNTER — Encounter: Payer: Self-pay | Admitting: Family Medicine

## 2018-09-24 DIAGNOSIS — Z01419 Encounter for gynecological examination (general) (routine) without abnormal findings: Secondary | ICD-10-CM | POA: Insufficient documentation

## 2018-09-24 DIAGNOSIS — B351 Tinea unguium: Secondary | ICD-10-CM | POA: Insufficient documentation

## 2018-09-24 NOTE — Assessment & Plan Note (Signed)
A1c at 8.7 from 6.9.  Likely due to increased carbohydrate heavy food intake.  On Jardiance 10 mg.  Unclear why not on metformin.  Likely add on metformin have patient follow-up in 3 months, we will give Her Dietary Changes.

## 2018-09-24 NOTE — Progress Notes (Signed)
   HPI 48 year old African-American female who presents for annual physical and Pap smear.  Pap smear performed in office, hemoglobin A1c also taken.  Increased from 6.9-> 8.7.  She states that overall things are going very well with no issues.  She states that she has had increased cravings for sweets that she is struggling with this.  She is also concerned about thickened nails in her feet.  This is been going on for a few months.  She has been keeping them filed down.  These have not been causing her any pain or itchiness to her feet.  CC: Annual physical, A1c check   ROS:   Review of Systems See HPI for ROS.   CC, SH/smoking status, and VS noted  Objective: BP 120/70   Pulse 70   Temp 98.3 F (36.8 C) (Oral)   Wt 204 lb 3.2 oz (92.6 kg)   SpO2 98%   BMI 35.05 kg/m  Gen: 48 year old African-American female, no acute distress, resting comfortably HEENT: Moist mucous membranes, no posterior pharyngeal erythema, no tonsillar exudates CV: RRR, no murmur Resp: CTAB, no wheezes, non-labored Abd: SNTND, BS present, no guarding or organomegaly GU: Cervix well visualized.  Cervical motion tenderness.  No discharge. Neuro: Alert and oriented, Speech clear, No gross deficits   Assessment and plan:  Women's annual routine gynecological examination Pap smear performed.  Will contact patient if abnormal results.  Diabetes type 2, controlled A1c at 8.7 from 6.9.  Likely due to increased carbohydrate heavy food intake.  On Jardiance 10 mg.  Unclear why not on metformin.  Likely add on metformin have patient follow-up in 3 months, we will give Her Dietary Changes.  Onychomycosis Nail overgrowth consistent with onychomycosis.  Explained to patient to keep it filed down.  Given long treatment course and high likelihood that will recur, will just monitor.   Orders Placed This Encounter  Procedures  . HgB A1c    Meds ordered this encounter  Medications  . Lancets (ONETOUCH  ULTRASOFT) lancets    Sig: Use as instructed    Dispense:  100 each    Refill:  12  . naproxen (NAPROSYN) 500 MG tablet    Sig: TAKE 1 TABLET(500 MG) BY MOUTH TWICE DAILY WITH A MEAL    Dispense:  180 tablet    Refill:  0    **Patient requests 90 days supply**     Guadalupe Dawn MD PGY-2 Family Medicine Resident  09/24/2018 1:39 PM

## 2018-09-24 NOTE — Assessment & Plan Note (Signed)
Nail overgrowth consistent with onychomycosis.  Explained to patient to keep it filed down.  Given long treatment course and high likelihood that will recur, will just monitor.

## 2018-09-24 NOTE — Assessment & Plan Note (Signed)
Pap smear performed.  Will contact patient if abnormal results.

## 2018-09-25 LAB — CYTOLOGY - PAP
Adequacy: ABSENT
Diagnosis: NEGATIVE
HPV: NOT DETECTED

## 2018-11-19 ENCOUNTER — Other Ambulatory Visit: Payer: Self-pay

## 2018-11-19 DIAGNOSIS — E119 Type 2 diabetes mellitus without complications: Secondary | ICD-10-CM

## 2018-11-20 MED ORDER — LOSARTAN POTASSIUM 50 MG PO TABS: 50.0000 mg | ORAL_TABLET | Freq: Every day | ORAL | 0 refills | Status: DC

## 2018-11-20 MED ORDER — EMPAGLIFLOZIN 10 MG PO TABS: 10.0000 mg | ORAL_TABLET | Freq: Every day | ORAL | 3 refills | Status: DC

## 2019-02-12 ENCOUNTER — Other Ambulatory Visit: Payer: Self-pay | Admitting: Family Medicine

## 2019-05-13 ENCOUNTER — Other Ambulatory Visit: Payer: Self-pay | Admitting: Family Medicine

## 2019-07-28 ENCOUNTER — Other Ambulatory Visit: Payer: Self-pay | Admitting: Family Medicine

## 2019-09-20 ENCOUNTER — Encounter: Payer: Self-pay | Admitting: Family Medicine

## 2019-09-20 ENCOUNTER — Other Ambulatory Visit: Payer: Self-pay

## 2019-09-20 ENCOUNTER — Ambulatory Visit (INDEPENDENT_AMBULATORY_CARE_PROVIDER_SITE_OTHER): Payer: Medicare Other | Admitting: Family Medicine

## 2019-09-20 VITALS — BP 115/80 | HR 93 | Wt 199.2 lb

## 2019-09-20 DIAGNOSIS — K219 Gastro-esophageal reflux disease without esophagitis: Secondary | ICD-10-CM | POA: Diagnosis not present

## 2019-09-20 DIAGNOSIS — Z0000 Encounter for general adult medical examination without abnormal findings: Secondary | ICD-10-CM | POA: Insufficient documentation

## 2019-09-20 DIAGNOSIS — Z1211 Encounter for screening for malignant neoplasm of colon: Secondary | ICD-10-CM

## 2019-09-20 DIAGNOSIS — I10 Essential (primary) hypertension: Secondary | ICD-10-CM | POA: Diagnosis not present

## 2019-09-20 DIAGNOSIS — E119 Type 2 diabetes mellitus without complications: Secondary | ICD-10-CM | POA: Diagnosis not present

## 2019-09-20 LAB — POCT GLYCOSYLATED HEMOGLOBIN (HGB A1C): HbA1c, POC (controlled diabetic range): 11 % — AB (ref 0.0–7.0)

## 2019-09-20 MED ORDER — METFORMIN HCL ER 500 MG PO TB24: 500.0000 mg | ORAL_TABLET | Freq: Every day | ORAL | 0 refills | Status: DC

## 2019-09-20 NOTE — Patient Instructions (Signed)
It was great seeing you today!  Your burning and chest pain sounds a lot like reflux disease.  You can try over-the-counter Pepcid 20 mg for this.  If this does not relieve it, let me know and I can send in something a little stronger.  Your A1c has been increased to 11.  I think that you would benefit from being seen in our pharmacy clinic.  There they can get you a continuous glucose monitor and tweak medications as needed.  Continue to take the Jardiance 10 mg, and we will add on Metformin.  We will start you off at only 500 mg extended release, but we can increase this if you are able to tolerate.  Lastly admitted appointment to get you seen by a gastroenterologist for colonoscopy.

## 2019-09-20 NOTE — Assessment & Plan Note (Signed)
Overdue for colonoscopy.  Place referral today.  Foot exam performed.  Needs ophthalmology exam, mammogram

## 2019-09-20 NOTE — Assessment & Plan Note (Addendum)
Symptoms of acid reflux disease.  Recommended cessation of the greasy, fried foods.  We will trial Pepcid 20 mg daily see if this helps her symptoms.  If that is control it could consider going up to 40 versus alternative work-up.

## 2019-09-20 NOTE — Assessment & Plan Note (Addendum)
A1c up to 11 from 8.7.  Large jump likely secondary to increased intake of carb heavy foods, lack of exercise.  Has been compliant with Jardiance per her report.  She is interested in retrying Metformin.  We will start Metformin XL 500 mg daily.  She can let me know how the diarrhea does, could consider split dosing this.  I think she would benefit tremendously from a continuous glucose monitor, asked her to make appointment with pharmacy clinic.  The patient would not like to use insulin at this point, and would like to try to get it back under control with oral medications and dietary changes.  I did spend a significant amount of time discussing dietary and physical activity changes patient can make.

## 2019-09-20 NOTE — Progress Notes (Signed)
   CHIEF COMPLAINT / HPI: 49 year old female who presents for A1c check.  Also complains about reflux-like symptoms.  Patient states that she has been taking her Jardiance daily as prescribed.  She has not been eating well recently, diet has consisted of a lot of fried and carb heavy foods.  A1c up to 11 from 8.5.  States that she has having symptoms of increased urinary frequency, increased thirst.  States that she has been checking her sugars and have been in the mid to high 200s recently.  Of note she has been on Metformin in the past but this was discontinued due to diarrhea.  Patient has not been getting any exercise secondary to COVID-19.  She has only been checking her sugars once or possibly twice on some days.  She is overdue for a colonoscopy.  She also complains about persistent burning in the back of her throat at night.  A metallic taste at night.  She attributes this to her worsening diet recently consisting of a lot of fried and greasy foods.  Denies any chest pain, shortness of breath, or any other symptoms.   OBJECTIVE: BP 115/80   Pulse 93   Wt 199 lb 3.2 oz (90.4 kg)   SpO2 97%   BMI 34.19 kg/m   Gen: 49 year old African-American female, no acute distress, resting comfortably, very pleasant CV: Regular rate and rhythm, no M/R/G Resp: Clear to auscultation bilaterally, no accessory muscle use Neuro: Alert and oriented, Speech clear, No gross deficits  Foot exam: No ulceration appreciated.  Palpable PT/DP bilaterally.  Sensation fully intact all distribution.  ASSESSMENT / PLAN:  Uncontrolled type II diabetes mellitus (HCC) A1c up to 11 from 8.7.  Large jump likely secondary to increased intake of carb heavy foods, lack of exercise.  Has been compliant with Jardiance per her report.  She is interested in retrying Metformin.  We will start Metformin XL 500 mg daily.  She can let me know how the diarrhea does, could consider split dosing this.  I think she would benefit  tremendously from a continuous glucose monitor, asked her to make appointment with pharmacy clinic.  The patient would not like to use insulin at this point, and would like to try to get it back under control with oral medications and dietary changes.  I did spend a significant amount of time discussing dietary and physical activity changes patient can make.  Acid reflux Symptoms of acid reflux disease.  Recommended cessation of the greasy, fried foods.  We will trial Pepcid 20 mg daily see if this helps her symptoms.  If that is control it could consider going up to 40 versus alternative work-up.  HTN (hypertension) Well-controlled, BP 115/80 at today's visit.  Takes losartan 50 mg daily.  Healthcare maintenance Overdue for colonoscopy.  Place referral today.  Foot exam performed.  Needs ophthalmology exam, mammogram     Guadalupe Dawn, MD [place]

## 2019-09-20 NOTE — Assessment & Plan Note (Signed)
Well-controlled, BP 115/80 at today's visit.  Takes losartan 50 mg daily.

## 2019-09-22 ENCOUNTER — Encounter: Payer: Self-pay | Admitting: Gastroenterology

## 2019-09-27 ENCOUNTER — Ambulatory Visit (INDEPENDENT_AMBULATORY_CARE_PROVIDER_SITE_OTHER): Payer: Medicare Other | Admitting: Pharmacist

## 2019-09-27 ENCOUNTER — Other Ambulatory Visit: Payer: Self-pay

## 2019-09-27 ENCOUNTER — Encounter: Payer: Self-pay | Admitting: Pharmacist

## 2019-09-27 DIAGNOSIS — E1165 Type 2 diabetes mellitus with hyperglycemia: Secondary | ICD-10-CM

## 2019-09-27 MED ORDER — OZEMPIC (0.25 OR 0.5 MG/DOSE) 2 MG/1.5ML ~~LOC~~ SOPN: 0.2500 mg | PEN_INJECTOR | SUBCUTANEOUS | 0 refills | Status: DC

## 2019-09-27 MED ORDER — NITROGLYCERIN 0.3 MG SL SUBL: 0.3000 mg | SUBLINGUAL_TABLET | SUBLINGUAL | 0 refills | Status: AC | PRN

## 2019-09-27 MED ORDER — OZEMPIC (0.25 OR 0.5 MG/DOSE) 2 MG/1.5ML ~~LOC~~ SOPN: 0.5000 mg | PEN_INJECTOR | SUBCUTANEOUS | 11 refills | Status: DC

## 2019-09-27 MED ORDER — EPINEPHRINE 0.3 MG/0.3ML IJ SOAJ: 0.3000 mg | INTRAMUSCULAR | 1 refills | Status: DC | PRN

## 2019-09-27 MED ORDER — EMPAGLIFLOZIN 25 MG PO TABS: 25.0000 mg | ORAL_TABLET | Freq: Every day | ORAL | 3 refills | Status: DC

## 2019-09-27 NOTE — Progress Notes (Signed)
Reviewed: I agree with Dr. Koval's documentation and management. 

## 2019-09-27 NOTE — Progress Notes (Signed)
S:     Chief Complaint  Patient presents with  . Medication Management    Diabetes    Patient arrives in good spirits, ambulating without assistance.  Presents for diabetes evaluation, education, and management Patient was referred and last seen by Primary Care Provider, Dr. Kris Mouton, on 09/20/2019.   Patient reports Diabetes was diagnosed approximately 10 years ago.  She reports that she was much more active and lost weight at the time of diagnosis.  She realizes that her weight has increased 50 pounds in the last 8-9 years.  She desires to lose weight and her self-selected ideal weight is 165 pounds.     Insurance coverage/medication affordability: [facility]  Patient reports adherence with medications.  Current diabetes medications include: Metformin but desires to stop as she is noticing rash and dry skin with retrial.  Empagliflozin 10mg  daily.  Current hypertension medications include: losartan 50mg   Current hyperlipidemia medications include: none  Patient denies hypoglycemic events.  Patient reported dietary habits: Eats 2-3 meals/day Breakfast:  Eggs, sausage, grits +/- oatmeal Lunch: [employer] (fish sandwich, fries, Sprite) Dinner: chicken (now baked),  Snacks: watermelon, cucumber, greek yogurt Drinks: trying to drink more sugar free beverages    Patient-reported exercise habits: minimal  But willing to increase walking plan.  Considering a gym membership.    Patient reports nocturia (nighttime urination).  Decreased from 4 to 2x per night.  Patient reports neuropathy (nerve pain) some tingling in toes.  Patient denies visual changes. Some blurry vision. Patient reports self foot exams.     O:  Physical Exam Vitals reviewed.  Constitutional:      Appearance: Normal appearance.  Pulmonary:     Effort: Pulmonary effort is normal.  Musculoskeletal:        General: No swelling.     Right lower leg: No edema.     Left lower leg: No edema.   Neurological:     Mental Status: She is alert.  Psychiatric:        Mood and Affect: Mood normal.        Behavior: Behavior normal.        Thought Content: Thought content normal.        Judgment: Judgment normal.      Review of Systems  Eyes: Positive for blurred vision.  Cardiovascular: Negative for chest pain.  All other systems reviewed and are negative.    Lab Results  Component Value Date   HGBA1C 11.0 (A) 09/20/2019   Vitals:   09/27/19 1056  Pulse: 86  SpO2: 97%    Lipid Panel     Component Value Date/Time   CHOL 147 03/25/2017 1717   TRIG 97 03/25/2017 1717   HDL 52 03/25/2017 1717   CHOLHDL 2.8 03/25/2017 1717   CHOLHDL 4.1 10/26/2014 1109   VLDL 14 10/26/2014 1109   LDLCALC 76 03/25/2017 1717    Home fasting blood sugars: Majority > 200 2 hour post-meal/random blood sugars: mid-low 200s   Clinical Atherosclerotic Cardiovascular Disease (ASCVD): Yes ? - has NTG for Hx Chest Pain The 10-year ASCVD risk score Mikey Bussing DC Jr., et al., 2013) is: 4.3%   Values used to calculate the score:     Age: 49 years     Sex: Female     Is Non-Hispanic African American: Yes     Diabetic: Yes     Tobacco smoker: No     Systolic Blood Pressure: [id number]     Is BP treated:  Yes     HDL Cholesterol: 52 mg/dL     Total Cholesterol: 147 mg/dL    A/P: Diabetes diagnosed for approximately 10 years. Patient is able to verbalize appropriate hypoglycemia management plan. Patient appears adherent with medication. Control is suboptimal due to sedentary lifestyle and dietary indiscretion.  Intolerance with Metformin - rash, leading to patient preference to stop.  -Initiated a trial of GLP-1  Ozempic (generic name ) 0.25mg  once weekly - sample provided.  New prescription sent for insurance claim initiation.  Likely this is will require additional paperwork.  -Increased dose of SGLT2-I  Jardaince  (generic name empagliflozin) from 10mg  daily then 20mg  (2x 10mg  until gone) then  take new prescription dose of 25mg .  -Extensively discussed pathophysiology of diabetes, recommended lifestyle interventions, dietary effects on blood sugar control -Counseled on s/sx of and management of hypoglycemia May consider TSH in the future to evaluate if thyroid dysfunction can be contributing to weight gain.   Written patient instructions provided.  Total time in face to face counseling 42 minutes.   Follow up Pharmacist by phone in 2-3 weeks.  Patient seen with Marin Roberts, PharmD PGY-1 Resident.

## 2019-09-27 NOTE — Assessment & Plan Note (Signed)
Diabetes diagnosed for approximately 10 years. Patient is able to verbalize appropriate hypoglycemia management plan. Patient appears adherent with medication. Control is suboptimal due to sedentary lifestyle and dietary indiscretion.  Intolerance with Metformin - rash, leading to patient preference to stop.  -Initiated a trial of GLP-1  Ozempic (generic name ) 0.25mg  once weekly - sample provided.  New prescription sent for insurance claim initiation.  Likely this is will require additional paperwork.  -Increased dose of SGLT2-I  Jardaince  (generic name empagliflozin) from 10mg  daily then 20mg  (2x 10mg  until gone) then take new prescription dose of 25mg .  -Extensively discussed pathophysiology of diabetes, recommended lifestyle interventions, dietary effects on blood sugar control -Counseled on s/sx of and management of hypoglycemia

## 2019-09-27 NOTE — Patient Instructions (Addendum)
Great to meet you today!  Please RESTART your walking plan - Consider staring a workout plan at the gym.   Please STOP your metformin at this time.   Increase your jardiance (empagliflozin) dose.  Take 2 of the 20mg  until gone, THEN take 25mg  once daily.   START taking Ozempic once weekly 0.25mg    Follow-up by phone in 2-3 weeks.

## 2019-10-06 ENCOUNTER — Ambulatory Visit: Payer: Medicare Other | Admitting: Family Medicine

## 2019-10-11 ENCOUNTER — Other Ambulatory Visit: Payer: Self-pay

## 2019-10-11 MED ORDER — ONETOUCH ULTRASOFT LANCETS MISC: 12 refills | Status: DC

## 2019-10-14 ENCOUNTER — Other Ambulatory Visit: Payer: Self-pay | Admitting: *Deleted

## 2019-10-14 MED ORDER — ONETOUCH ULTRASOFT LANCETS MISC: 12 refills | Status: DC

## 2019-10-15 ENCOUNTER — Telehealth (HOSPITAL_COMMUNITY): Payer: Self-pay | Admitting: Student-PharmD

## 2019-10-15 MED ORDER — ONETOUCH ULTRA VI STRP: ORAL_STRIP | 12 refills | Status: DC

## 2019-10-15 NOTE — Telephone Encounter (Signed)
Patient in good spirits when speaking on the phone.  Patient was recently hospitalized from 3/21 to 3/24 for stomach ulcers that became more painful; patient was sent home with medications but can't remember name of it, states medication working well.  Ask patient how the Ozempic is doing for her, patient state everything is going well. Asked patient if she is experiencing any side effects, patient states no, not experiencing any nausea.  Patient is still taking Jardiance 10 mg, taking 2 tablets currently and has not needed to pick up 25 mg dose from pharmacy yet. She is still drinking a good amount of water daily.  Patient is able to walk daily, except for days during hospitalization.  She mentioned she is out of test strips, pharmacy sent refills to patient's requested pharmacy.  She does not have a follow-up visit and will schedule one soon.

## 2019-10-18 NOTE — Telephone Encounter (Signed)
Noted and agree. 

## 2019-10-20 ENCOUNTER — Telehealth: Payer: Self-pay

## 2019-10-20 NOTE — Telephone Encounter (Signed)
Patient calls nurse line regarding darkening in stool over the last two days. Patient denies fever, abdominal pain, diarrhea or blood in stool. This is 3rd week on ozempic. Offered to make patient appointment with PCP this afternoon, patient declined, stating that she already has another appointment scheduled for this afternoon.   ED precautions given  Please advise how patient should proceed.   To PCP and Dr. Pervis Hocking, RN

## 2019-10-25 ENCOUNTER — Telehealth: Payer: Self-pay | Admitting: *Deleted

## 2019-10-25 ENCOUNTER — Other Ambulatory Visit: Payer: Self-pay

## 2019-10-25 MED ORDER — LOSARTAN POTASSIUM 50 MG PO TABS: ORAL_TABLET | ORAL | 0 refills | Status: DC

## 2019-10-25 NOTE — Telephone Encounter (Signed)
Patient no-show for pre-visit.  Patient states she was treated in hospital for bleeding ulcers in [place], [place] and received both upper and lower endoscopy there.  Patient states she will be following up with doctor in  [place].

## 2019-11-04 ENCOUNTER — Telehealth: Payer: Self-pay | Admitting: Pharmacist

## 2019-11-04 NOTE — Telephone Encounter (Signed)
Contacted patient in follow-up of lblood glucose control.   Patient reports "excellent" control with readings reported as 90, 83 and 155.  She denies any hypoglycemia.  She notes weight loss.  She reports a weight of 189 which is ~ 7 pounds decreased from last assessment.   Taking Jardiance 25mg  (started this dose 1 week ago after finishing the 10mg X2 (20mg  dose) with previous med supply).  Also taking Ozempic 0.25mg  once weekly.   Denies any side effects from [place] or Ozempic.   Note: she has recently started omeprazole for a GI Ulcer which she plans to take for 2 months.   We agreed next follow-up with PCP, Dr. Kris Mouton in 2 months.

## 2019-11-04 NOTE — Telephone Encounter (Signed)
Noted and agree. 

## 2019-11-08 ENCOUNTER — Encounter: Payer: Medicare Other | Admitting: Gastroenterology

## 2019-12-27 ENCOUNTER — Other Ambulatory Visit: Payer: Self-pay | Admitting: *Deleted

## 2019-12-27 MED ORDER — METFORMIN HCL ER 500 MG PO TB24: 500.0000 mg | ORAL_TABLET | Freq: Every day | ORAL | 0 refills | Status: DC

## 2020-01-17 ENCOUNTER — Other Ambulatory Visit: Payer: Self-pay | Admitting: *Deleted

## 2020-01-17 MED ORDER — LOSARTAN POTASSIUM 50 MG PO TABS: ORAL_TABLET | ORAL | 0 refills | Status: DC

## 2020-03-26 DIAGNOSIS — U071 COVID-19: Secondary | ICD-10-CM | POA: Diagnosis not present

## 2020-03-26 DIAGNOSIS — R0602 Shortness of breath: Secondary | ICD-10-CM | POA: Diagnosis not present

## 2020-03-26 DIAGNOSIS — R9431 Abnormal electrocardiogram [ECG] [EKG]: Secondary | ICD-10-CM | POA: Diagnosis not present

## 2020-03-26 DIAGNOSIS — R509 Fever, unspecified: Secondary | ICD-10-CM | POA: Diagnosis not present

## 2020-03-27 DIAGNOSIS — R9431 Abnormal electrocardiogram [ECG] [EKG]: Secondary | ICD-10-CM | POA: Diagnosis not present

## 2020-04-05 NOTE — Progress Notes (Signed)
    SUBJECTIVE:   CHIEF COMPLAINT / HPI:   Dysuria/hematuria: pt has been having symptoms of UTI for past three days including dysuria and increased urinary frequency.  She has taken Azo for the past two days.  On her first day of symptoms she felt feverish, though did not take a temperature.  She sees blood on the toilet paper when she urinates.  She has not had sexual intercourse in 4 months.  She has not had her menstrual period in 8 years.  She denies urinary odor. Patient takes jardiance for past year for diabetes.  Her last UTI was almost two years ago.    PERTINENT  PMH / PSH: DM  OBJECTIVE:   BP 100/74   Pulse (!) 106   Ht 5' 4.76" (1.645 m)   Wt 165 lb (74.8 kg)   SpO2 94%   BMI 27.66 kg/m   Gen: alert, oriented. No acute distress.  CV: RRR. No murmurs.  GI: soft, nontender.  Normal bowel sounds.  Nontender.   ASSESSMENT/PLAN:   UTI (urinary tract infection) 3 days of symptoms including dyrusia and increased frequencyd.  UA indicates UTI w/ hgb, LE, nitrites, protein.  Will give keflex QID x 5d.  Can continue jardiance during treatment.  If pt continues to get UTIs will need to re-evaluate if she should be switched to an alternative treatment.    Uncontrolled type II diabetes mellitus ([place]) Pt has lost approximately 20 lbs and a1c has gone from > 11 to < 7 since starting ozempic.  Encouraged pt to continue compliance with medication.       Benay Pike, MD [place]

## 2020-04-06 ENCOUNTER — Other Ambulatory Visit: Payer: Self-pay

## 2020-04-06 ENCOUNTER — Ambulatory Visit (INDEPENDENT_AMBULATORY_CARE_PROVIDER_SITE_OTHER): Payer: Medicare Other | Admitting: Family Medicine

## 2020-04-06 VITALS — BP 100/74 | HR 106 | Ht 64.76 in | Wt 165.0 lb

## 2020-04-06 DIAGNOSIS — N3001 Acute cystitis with hematuria: Secondary | ICD-10-CM | POA: Diagnosis not present

## 2020-04-06 DIAGNOSIS — R300 Dysuria: Secondary | ICD-10-CM

## 2020-04-06 DIAGNOSIS — E1165 Type 2 diabetes mellitus with hyperglycemia: Secondary | ICD-10-CM | POA: Diagnosis not present

## 2020-04-06 LAB — POCT URINALYSIS DIP (MANUAL ENTRY)
Glucose, UA: >=1000 mg/dL — AB
Nitrite, UA: POSITIVE — AB
Protein Ur, POC: >=300 mg/dL — AB
Spec Grav, UA: 1.02 (ref 1.010–1.025)
Urobilinogen, UA: 1 E.U./dL
pH, UA: 6 (ref 5.0–8.0)

## 2020-04-06 LAB — POCT UA - MICROSCOPIC ONLY: RBC, urine, microscopic: >20

## 2020-04-06 LAB — POCT GLYCOSYLATED HEMOGLOBIN (HGB A1C): HbA1c, POC (controlled diabetic range): 6.7 % (ref 0.0–7.0)

## 2020-04-06 MED ORDER — CEPHALEXIN 500 MG PO CAPS: 500.0000 mg | ORAL_CAPSULE | Freq: Four times a day (QID) | ORAL | 0 refills | Status: AC

## 2020-04-06 NOTE — Patient Instructions (Signed)
It was nice to meet you today,   You have a UTI.  I have prescribed Keflex for you to take 4 times a day for 5 days.  If this does not help with your symptoms please let us know.  I would like you to come back in a bout a month to give a urine sample to make sure the blood in your urine has resolved.    Have a great day,   Clemetine Marker, MD

## 2020-04-06 NOTE — Assessment & Plan Note (Signed)
Pt has lost approximately 20 lbs and a1c has gone from > 11 to < 7 since starting ozempic.  Encouraged pt to continue compliance with medication.

## 2020-04-06 NOTE — Assessment & Plan Note (Signed)
3 days of symptoms including dyrusia and increased frequencyd.  UA indicates UTI w/ hgb, LE, nitrites, protein.  Will give keflex QID x 5d.  Can continue jardiance during treatment.  If pt continues to get UTIs will need to re-evaluate if she should be switched to an alternative treatment.

## 2020-05-30 ENCOUNTER — Other Ambulatory Visit: Payer: Self-pay | Admitting: Family Medicine

## 2020-08-23 ENCOUNTER — Other Ambulatory Visit: Payer: Self-pay | Admitting: Family Medicine

## 2020-09-27 NOTE — Progress Notes (Signed)
° ° °  SUBJECTIVE:   CHIEF COMPLAINT / HPI: physical, DM f/u    Cathy Bond presents today for physical and DM f/u.     T2DM Patient reports she has been taking jardiance for her DM management. She reports that after weight loss, she thought she no  Longer needed ozempic and stopped taking it two weeks ago. A1c is elevated from 6.7 to 8.0 today. DM foot exam completed today. Discussed starting statin therapy which patient is agreeable with. Recommending diabetes eye exam.    HM  Patient agreeable to hep C screening. She reports having a colonoscopy while in [place] with her significant other as she was seen in the ED for severe abdominal pain. She reports that she was told she had peptic ulcers likely due to taking naproxen daily. She was evaluated at [facility] and had trial of PPI for 8 weeks. Recommended for colonoscopy in 2031. Patient declines GC testing today as she reports being monogamous with her partner of 7 years who is 26 years older than her.    Hx of Colitis/duodenal ulcer   Last seen in May for rectal bleeding thought to be due to hemorrhoids. Visualized non bleeding or inflamed hemorrhoid on exam today. She reports some GERD symptoms intermittently and uses pepcid for those symptoms. Re-emphasized importance of avoiding NSAIDS and ASA.   Urticarial rash Patient reports that 10 days ago she had a rash wrote on her chest, back and abdomen as well as her neck and into her scalp.  She reports that she ate Valentine's Day candy around this time and then noted the rash started.  Rash has been intensely pruritic but improved with Benadryl.  She reports that the rash seems to "move around" and will be present in one area and then move to another area within hours.  She reports no problems with this before.  She has not applied any topical agents to the rash.  Patient reports that she often does not eat chocolate  PERTINENT  PMH / PSH:  DM HTN    OBJECTIVE:   BP  135/85    Pulse 89    Ht 5\' 4"  (1.626 m)    Wt 184 lb 3.2 oz (83.6 kg)    SpO2 99%    BMI 31.62 kg/m   General: female appearing stated age in no acute distress Pulm: normal WOB  Skin: Rash sparing palms and soles,  Genitalia:  Normal introitus for age, no external lesions, white vaginal discharge, mucosa pink and moist, no vaginal or cervical lesions, no vaginal atrophy, no friaility or hemorrhage, normal uterus size and position, no adnexal masses or tendernes   ASSESSMENT/PLAN:   Uncontrolled type II diabetes mellitus (HCC) A1c elevated 8.0 Refilled ozempic, patient will restart taking as she has not been taking regularly  F/u in 1 month  [place] maintenance Pap smear Hep C screening 3D mammogram ordered Colonoscopy due 2031 per review of GI records from May 2021  Urticaria Rash and history consistent with urticarial reaction due to chocolate. Cetirizine 10 mg daily Follow-up in a week   Duodenal ulcer Stable Diagnosed in 2021 Patient has seen GI physician for this and had 8 week trial of PPI  She uses H2 blocker intermittently  Knows to avoid NSAIDS and ASA    F/u in 1 week for urticaria   Eulis Foster, MD [place]

## 2020-09-28 ENCOUNTER — Encounter: Payer: Self-pay | Admitting: Family Medicine

## 2020-09-28 ENCOUNTER — Other Ambulatory Visit: Payer: Self-pay

## 2020-09-28 ENCOUNTER — Ambulatory Visit (INDEPENDENT_AMBULATORY_CARE_PROVIDER_SITE_OTHER): Payer: Medicare Other | Admitting: Family Medicine

## 2020-09-28 ENCOUNTER — Other Ambulatory Visit (HOSPITAL_COMMUNITY)
Admission: RE | Admit: 2020-09-28 | Discharge: 2020-09-28 | Disposition: A | Discharge status: Home / Self Care | Payer: Medicare Other | Source: Ambulatory Visit | Attending: Family Medicine | Admitting: Family Medicine

## 2020-09-28 VITALS — BP 135/85 | HR 89 | Ht 64.0 in | Wt 184.2 lb

## 2020-09-28 DIAGNOSIS — Z1211 Encounter for screening for malignant neoplasm of colon: Secondary | ICD-10-CM

## 2020-09-28 DIAGNOSIS — K269 Duodenal ulcer, unspecified as acute or chronic, without hemorrhage or perforation: Secondary | ICD-10-CM | POA: Diagnosis not present

## 2020-09-28 DIAGNOSIS — E1165 Type 2 diabetes mellitus with hyperglycemia: Secondary | ICD-10-CM | POA: Diagnosis not present

## 2020-09-28 DIAGNOSIS — Z124 Encounter for screening for malignant neoplasm of cervix: Secondary | ICD-10-CM

## 2020-09-28 DIAGNOSIS — R87610 Atypical squamous cells of undetermined significance on cytologic smear of cervix (ASC-US): Secondary | ICD-10-CM | POA: Insufficient documentation

## 2020-09-28 DIAGNOSIS — Z0000 Encounter for general adult medical examination without abnormal findings: Secondary | ICD-10-CM

## 2020-09-28 DIAGNOSIS — L509 Urticaria, unspecified: Secondary | ICD-10-CM | POA: Diagnosis not present

## 2020-09-28 DIAGNOSIS — Z1151 Encounter for screening for human papillomavirus (HPV): Secondary | ICD-10-CM | POA: Insufficient documentation

## 2020-09-28 LAB — POCT GLYCOSYLATED HEMOGLOBIN (HGB A1C): Hemoglobin A1C: 8 % — AB (ref 4.0–5.6)

## 2020-09-28 MED ORDER — EMPAGLIFLOZIN 25 MG PO TABS: 25.0000 mg | ORAL_TABLET | Freq: Every day | ORAL | 3 refills | Status: DC

## 2020-09-28 MED ORDER — ROSUVASTATIN CALCIUM 20 MG PO TABS: 20.0000 mg | ORAL_TABLET | Freq: Every day | ORAL | 3 refills | Status: DC

## 2020-09-28 MED ORDER — ONETOUCH ULTRA VI STRP
ORAL_STRIP | 12 refills | Status: DC
Start: 2020-09-28 — End: 2021-04-25

## 2020-09-28 MED ORDER — ONETOUCH ULTRASOFT LANCETS MISC: 12 refills | Status: DC

## 2020-09-28 MED ORDER — CETIRIZINE HCL 10 MG PO TABS: 10.0000 mg | ORAL_TABLET | Freq: Every day | ORAL | 11 refills | Status: DC

## 2020-09-28 MED ORDER — OZEMPIC (0.25 OR 0.5 MG/DOSE) 2 MG/1.5ML ~~LOC~~ SOPN: 0.2500 mg | PEN_INJECTOR | SUBCUTANEOUS | 4 refills | Status: DC

## 2020-09-28 NOTE — Assessment & Plan Note (Signed)
Rash and history consistent with urticarial reaction due to chocolate. Cetirizine 10 mg daily Follow-up in a week

## 2020-09-28 NOTE — Assessment & Plan Note (Signed)
Pap smear Hep C screening 3D mammogram ordered Colonoscopy due 2031 per review of GI records from May 2021

## 2020-09-28 NOTE — Patient Instructions (Addendum)
Thank you for allowing [place] to take part in your care today.  For your diabetes: I have refilled your Ozempic.  Please continue to take your Ozempic weekly as well as your Jardiance.  I recommend following up with me in the next month.  Your A1c was elevated at 8 today from 6.7 previously.  We also completed your diabetes foot exam. I recommend that you see an eye doctor for diabetes eye exam.  We also completed a Pap smear today and we will do some blood work to check your cholesterol, as well as your electrolytes, liver and kidneys.  I will follow with you with any abnormal results.  Normal results will be mailed to you on a letter.  For your rash, I have prescribed cetirizine 10 mg daily.  Please continue to take this and follow-up with me in 1 week. Diabetes Mellitus and Nutrition, Adult When you have diabetes, or diabetes mellitus, it is very important to have healthy eating habits because your blood sugar (glucose) levels are greatly affected by what you eat and drink. Eating healthy foods in the right amounts, at about the same times every day, can help you:  Control your blood glucose.  Lower your risk of heart disease.  Improve your blood pressure.  Reach or maintain a healthy weight. What can affect my meal plan? Every person with diabetes is different, and each person has different needs for a meal plan. Your health care provider may recommend that you work with a dietitian to make a meal plan that is best for you. Your meal plan may vary depending on factors such as:  The calories you need.  The medicines you take.  Your weight.  Your blood glucose, blood pressure, and cholesterol levels.  Your activity level.  Other health conditions you have, such as heart or kidney disease. How do carbohydrates affect me? Carbohydrates, also called carbs, affect your blood glucose level more than any other type of food. Eating carbs naturally raises the amount of glucose in your blood.  Carb counting is a method for keeping track of how many carbs you eat. Counting carbs is important to keep your blood glucose at a healthy level, especially if you use insulin or take certain oral diabetes medicines. It is important to know how many carbs you can safely have in each meal. This is different for every person. Your dietitian can help you calculate how many carbs you should have at each meal and for each snack. How does alcohol affect me? Alcohol can cause a sudden decrease in blood glucose (hypoglycemia), especially if you use insulin or take certain oral diabetes medicines. Hypoglycemia can be a life-threatening condition. Symptoms of hypoglycemia, such as sleepiness, dizziness, and confusion, are similar to symptoms of having too much alcohol.  Do not drink alcohol if: ? Your health care provider tells you not to drink. ? You are pregnant, may be pregnant, or are planning to become pregnant.  If you drink alcohol: ? Do not drink on an empty stomach. ? Limit how much you use to:  0-1 drink a day for women.  0-2 drinks a day for men. ? Be aware of how much alcohol is in your drink. In the [place] one drink equals one 12 oz bottle of beer (355 mL), one 5 oz glass of wine (148 mL), or one 1 oz glass of hard liquor (44 mL). ? Keep yourself hydrated with water, diet soda, or unsweetened iced tea.  Keep in mind  that regular soda, juice, and other mixers may contain a lot of sugar and must be counted as carbs. What are tips for following this plan? Reading food labels  Start by checking the serving size on the "Nutrition Facts" label of packaged foods and drinks. The amount of calories, carbs, fats, and other nutrients listed on the label is based on one serving of the item. Many items contain more than one serving per package.  Check the total grams (g) of carbs in one serving. You can calculate the number of servings of carbs in one serving by dividing the total carbs by 15. For  example, if a food has 30 g of total carbs per serving, it would be equal to 2 servings of carbs.  Check the number of grams (g) of saturated fats and trans fats in one serving. Choose foods that have a low amount or none of these fats.  Check the number of milligrams (mg) of salt (sodium) in one serving. Most people should limit total sodium intake to less than 2,300 mg per day.  Always check the nutrition information of foods labeled as "low-fat" or "nonfat." These foods may be higher in added sugar or refined carbs and should be avoided.  Talk to your dietitian to identify your daily goals for nutrients listed on the label. Shopping  Avoid buying canned, pre-made, or processed foods. These foods tend to be high in fat, sodium, and added sugar.  Shop around the outside edge of the grocery store. This is where you will most often find fresh fruits and vegetables, bulk grains, fresh meats, and fresh dairy. Cooking  Use low-heat cooking methods, such as baking, instead of high-heat cooking methods like deep frying.  Cook using healthy oils, such as olive, canola, or sunflower oil.  Avoid cooking with butter, cream, or high-fat meats. Meal planning  Eat meals and snacks regularly, preferably at the same times every day. Avoid going long periods of time without eating.  Eat foods that are high in fiber, such as fresh fruits, vegetables, beans, and whole grains. Talk with your dietitian about how many servings of carbs you can eat at each meal.  Eat 4-6 oz (112-168 g) of lean protein each day, such as lean meat, chicken, fish, eggs, or tofu. One ounce (oz) of lean protein is equal to: ? 1 oz (28 g) of meat, chicken, or fish. ? 1 egg. ?  cup (62 g) of tofu.  Eat some foods each day that contain healthy fats, such as avocado, nuts, seeds, and fish.   What foods should I eat? Fruits Berries. Apples. Oranges. Peaches. Apricots. Plums. Grapes. Mango. Papaya. Pomegranate. Kiwi.  Cherries. Vegetables Lettuce. Spinach. Leafy greens, including kale, chard, collard greens, and mustard greens. Beets. Cauliflower. Cabbage. Broccoli. Carrots. Green beans. Tomatoes. Peppers. Onions. Cucumbers. Brussels sprouts. Grains Whole grains, such as whole-wheat or whole-grain bread, crackers, tortillas, cereal, and pasta. Unsweetened oatmeal. Quinoa. Brown or wild rice. Meats and other proteins Seafood. Poultry without skin. Lean cuts of poultry and beef. Tofu. Nuts. Seeds. Dairy Low-fat or fat-free dairy products such as milk, yogurt, and cheese. The items listed above may not be a complete list of foods and beverages you can eat. Contact a dietitian for more information. What foods should I avoid? Fruits Fruits canned with syrup. Vegetables Canned vegetables. Frozen vegetables with butter or cream sauce. Grains Refined white flour and flour products such as bread, pasta, snack foods, and cereals. Avoid all processed foods. Meats and other  proteins Fatty cuts of meat. Poultry with skin. Breaded or fried meats. Processed meat. Avoid saturated fats. Dairy Full-fat yogurt, cheese, or milk. Beverages Sweetened drinks, such as soda or iced tea. The items listed above may not be a complete list of foods and beverages you should avoid. Contact a dietitian for more information. Questions to ask a health care provider  Do I need to meet with a diabetes educator?  Do I need to meet with a dietitian?  What number can I call if I have questions?  When are the best times to check my blood glucose? Where to find more information:  American Diabetes Association: diabetes.org  Academy of Nutrition and Dietetics: www.eatright.[employer] of Diabetes and Digestive and Kidney Diseases: [employer]  Association of Diabetes Care and Education Specialists: www.diabeteseducator.org Summary  It is important to have healthy eating habits because your blood sugar  (glucose) levels are greatly affected by what you eat and drink.  A healthy meal plan will help you control your blood glucose and maintain a healthy lifestyle.  Your health care provider may recommend that you work with a dietitian to make a meal plan that is best for you.  Keep in mind that carbohydrates (carbs) and alcohol have immediate effects on your blood glucose levels. It is important to count carbs and to use alcohol carefully. This information is not intended to replace advice given to you by your health care provider. Make sure you discuss any questions you have with your health care provider. Document Revised: 06/15/2019 Document Reviewed: 06/15/2019 Elsevier Patient Education  2021 [employer].

## 2020-09-28 NOTE — Assessment & Plan Note (Signed)
Stable Diagnosed in 2021 Patient has seen GI physician for this and had 8 week trial of PPI  She uses H2 blocker intermittently  Knows to avoid NSAIDS and ASA

## 2020-09-28 NOTE — Assessment & Plan Note (Signed)
A1c elevated 8.0 Refilled ozempic, patient will restart taking as she has not been taking regularly  F/u in 1 month  CMP

## 2020-09-29 ENCOUNTER — Encounter: Payer: Self-pay | Admitting: Family Medicine

## 2020-09-29 LAB — COMPREHENSIVE METABOLIC PANEL
ALT: 8 IU/L (ref 0–32)
AST: 14 IU/L (ref 0–40)
Albumin/Globulin Ratio: 1.4 (ref 1.2–2.2)
Albumin: 4.3 g/dL (ref 3.8–4.9)
Alkaline Phosphatase: 89 IU/L (ref 44–121)
BUN/Creatinine Ratio: 13 (ref 9–23)
BUN: 15 mg/dL (ref 6–24)
Bilirubin Total: 0.4 mg/dL (ref 0.0–1.2)
CO2: 23 mmol/L (ref 20–29)
Calcium: 9.6 mg/dL (ref 8.7–10.2)
Chloride: 99 mmol/L (ref 96–106)
Creatinine, Ser: 1.17 mg/dL — ABNORMAL HIGH (ref 0.57–1.00)
Globulin, Total: 3 g/dL (ref 1.5–4.5)
Glucose: 199 mg/dL — ABNORMAL HIGH (ref 65–99)
Potassium: 3.9 mmol/L (ref 3.5–5.2)
Sodium: 138 mmol/L (ref 134–144)
Total Protein: 7.3 g/dL (ref 6.0–8.5)
eGFR: 56 mL/min/{1.73_m2} — ABNORMAL LOW (ref >59)

## 2020-09-29 LAB — LIPID PANEL
Chol/HDL Ratio: 2.8 ratio (ref 0.0–4.4)
Cholesterol, Total: 179 mg/dL (ref 100–199)
HDL: 65 mg/dL (ref >39)
LDL Chol Calc (NIH): 98 mg/dL (ref 0–99)
Triglycerides: 87 mg/dL (ref 0–149)
VLDL Cholesterol Cal: 16 mg/dL (ref 5–40)

## 2020-10-05 LAB — CYTOLOGY - PAP
Comment: NEGATIVE
Diagnosis: UNDETERMINED — AB
High risk HPV: NEGATIVE

## 2020-10-12 ENCOUNTER — Ambulatory Visit (INDEPENDENT_AMBULATORY_CARE_PROVIDER_SITE_OTHER): Payer: Medicare Other | Admitting: Family Medicine

## 2020-10-12 ENCOUNTER — Other Ambulatory Visit: Payer: Self-pay

## 2020-10-12 VITALS — BP 110/78 | HR 99 | Wt 175.0 lb

## 2020-10-12 DIAGNOSIS — R87610 Atypical squamous cells of undetermined significance on cytologic smear of cervix (ASC-US): Secondary | ICD-10-CM | POA: Diagnosis not present

## 2020-10-12 NOTE — Assessment & Plan Note (Signed)
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 Recommend repeat pap in one year.

## 2020-10-12 NOTE — Progress Notes (Signed)
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  Patient given informed consent, signed copy in the chart.  Placed in lithotomy position. Cervix viewed with speculum and colposcope after application of acetic acid.   Colposcopy adequate (entire squamocolumnar junctions seen  in entirety) ?  yes Acetowhite lesions? none Punctation? none Mosaicism?  none Abnormal vasculature?  none Biopsies? none ECC? no Complications? no  COMMENTS:  Patient was given post procedure instructions.   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 Recommend repeat pap in one year.

## 2020-10-12 NOTE — Patient Instructions (Signed)
Your colposcopy exam was NORMAL. I would recommend  Pap in a year. We did not do any biopsies so there are no restrictions. Great  To see you!

## 2020-11-29 ENCOUNTER — Other Ambulatory Visit: Payer: Self-pay | Admitting: Family Medicine

## 2021-01-26 ENCOUNTER — Other Ambulatory Visit (HOSPITAL_BASED_OUTPATIENT_CLINIC_OR_DEPARTMENT_OTHER): Payer: Self-pay | Admitting: Family Medicine

## 2021-01-26 DIAGNOSIS — Z1231 Encounter for screening mammogram for malignant neoplasm of breast: Secondary | ICD-10-CM

## 2021-01-31 ENCOUNTER — Ambulatory Visit (HOSPITAL_BASED_OUTPATIENT_CLINIC_OR_DEPARTMENT_OTHER)
Admission: RE | Admit: 2021-01-31 | Discharge: 2021-01-31 | Disposition: A | Discharge status: Home / Self Care | Payer: Medicare Other | Source: Ambulatory Visit | Attending: Family Medicine | Admitting: Family Medicine

## 2021-01-31 ENCOUNTER — Other Ambulatory Visit: Payer: Self-pay

## 2021-01-31 DIAGNOSIS — Z1231 Encounter for screening mammogram for malignant neoplasm of breast: Secondary | ICD-10-CM | POA: Diagnosis not present

## 2021-04-06 ENCOUNTER — Other Ambulatory Visit: Payer: Self-pay | Admitting: Family Medicine

## 2021-04-06 DIAGNOSIS — E1165 Type 2 diabetes mellitus with hyperglycemia: Secondary | ICD-10-CM

## 2021-04-25 ENCOUNTER — Other Ambulatory Visit: Payer: Self-pay | Admitting: Family Medicine

## 2021-05-07 ENCOUNTER — Other Ambulatory Visit: Payer: Self-pay | Admitting: Family Medicine

## 2021-05-07 DIAGNOSIS — E1165 Type 2 diabetes mellitus with hyperglycemia: Secondary | ICD-10-CM

## 2021-05-12 DIAGNOSIS — R252 Cramp and spasm: Secondary | ICD-10-CM | POA: Diagnosis not present

## 2021-05-12 DIAGNOSIS — M70822 Other soft tissue disorders related to use, overuse and pressure, left upper arm: Secondary | ICD-10-CM | POA: Diagnosis not present

## 2021-05-20 ENCOUNTER — Other Ambulatory Visit: Payer: Self-pay | Admitting: Family Medicine

## 2021-06-07 ENCOUNTER — Ambulatory Visit: Payer: Medicare Other | Admitting: Family Medicine

## 2021-06-09 DIAGNOSIS — R3589 Other polyuria: Secondary | ICD-10-CM | POA: Diagnosis not present

## 2021-08-01 ENCOUNTER — Emergency Department (HOSPITAL_COMMUNITY)
Admission: EM | Admit: 2021-08-01 | Discharge: 2021-08-01 | Disposition: A | Discharge status: Home / Self Care | Payer: Medicare Other | Attending: Emergency Medicine | Admitting: Emergency Medicine

## 2021-08-01 ENCOUNTER — Encounter (HOSPITAL_COMMUNITY): Payer: Self-pay | Admitting: Emergency Medicine

## 2021-08-01 ENCOUNTER — Emergency Department (HOSPITAL_COMMUNITY): Payer: Medicare Other

## 2021-08-01 DIAGNOSIS — E119 Type 2 diabetes mellitus without complications: Secondary | ICD-10-CM | POA: Insufficient documentation

## 2021-08-01 DIAGNOSIS — R202 Paresthesia of skin: Secondary | ICD-10-CM | POA: Diagnosis not present

## 2021-08-01 DIAGNOSIS — J339 Nasal polyp, unspecified: Secondary | ICD-10-CM

## 2021-08-01 DIAGNOSIS — R200 Anesthesia of skin: Secondary | ICD-10-CM | POA: Diagnosis not present

## 2021-08-01 DIAGNOSIS — R059 Cough, unspecified: Secondary | ICD-10-CM | POA: Diagnosis not present

## 2021-08-01 DIAGNOSIS — R040 Epistaxis: Secondary | ICD-10-CM | POA: Diagnosis present

## 2021-08-01 DIAGNOSIS — I10 Essential (primary) hypertension: Secondary | ICD-10-CM | POA: Insufficient documentation

## 2021-08-01 DIAGNOSIS — R519 Headache, unspecified: Secondary | ICD-10-CM

## 2021-08-01 DIAGNOSIS — I629 Nontraumatic intracranial hemorrhage, unspecified: Secondary | ICD-10-CM | POA: Diagnosis not present

## 2021-08-01 DIAGNOSIS — J0100 Acute maxillary sinusitis, unspecified: Secondary | ICD-10-CM

## 2021-08-01 DIAGNOSIS — Z79899 Other long term (current) drug therapy: Secondary | ICD-10-CM | POA: Insufficient documentation

## 2021-08-01 DIAGNOSIS — Z20822 Contact with and (suspected) exposure to covid-19: Secondary | ICD-10-CM | POA: Diagnosis not present

## 2021-08-01 HISTORY — DX: Essential (primary) hypertension: I10

## 2021-08-01 LAB — CBC WITH DIFFERENTIAL/PLATELET
Abs Immature Granulocytes: 0.01 10*3/uL (ref 0.00–0.07)
Basophils Absolute: 0 10*3/uL (ref 0.0–0.1)
Basophils Relative: 1 %
Eosinophils Absolute: 0.2 10*3/uL (ref 0.0–0.5)
Eosinophils Relative: 3 %
HCT: 39.5 % (ref 36.0–46.0)
Hemoglobin: 13.7 g/dL (ref 12.0–15.0)
Immature Granulocytes: 0 %
Lymphocytes Relative: 45 %
Lymphs Abs: 2.7 10*3/uL (ref 0.7–4.0)
MCH: 27.5 pg (ref 26.0–34.0)
MCHC: 34.7 g/dL (ref 30.0–36.0)
MCV: 79.2 fL — ABNORMAL LOW (ref 80.0–100.0)
Monocytes Absolute: 0.4 10*3/uL (ref 0.1–1.0)
Monocytes Relative: 6 %
Neutro Abs: 2.6 10*3/uL (ref 1.7–7.7)
Neutrophils Relative %: 45 %
Platelets: 264 10*3/uL (ref 150–400)
RBC: 4.99 MIL/uL (ref 3.87–5.11)
RDW: 13 % (ref 11.5–15.5)
WBC: 5.9 10*3/uL (ref 4.0–10.5)
nRBC: 0 % (ref 0.0–0.2)

## 2021-08-01 LAB — COMPREHENSIVE METABOLIC PANEL
ALT: 30 U/L (ref 0–44)
AST: 60 U/L — ABNORMAL HIGH (ref 15–41)
Albumin: 4.1 g/dL (ref 3.5–5.0)
Alkaline Phosphatase: 93 U/L (ref 38–126)
Anion gap: 6 (ref 5–15)
BUN: 18 mg/dL (ref 6–20)
CO2: 27 mmol/L (ref 22–32)
Calcium: 8.9 mg/dL (ref 8.9–10.3)
Chloride: 100 mmol/L (ref 98–111)
Creatinine, Ser: 0.81 mg/dL (ref 0.44–1.00)
GFR, Estimated: >60 mL/min (ref >60)
Glucose, Bld: 216 mg/dL — ABNORMAL HIGH (ref 70–99)
Potassium: 5.2 mmol/L — ABNORMAL HIGH (ref 3.5–5.1)
Sodium: 133 mmol/L — ABNORMAL LOW (ref 135–145)
Total Bilirubin: 1.4 mg/dL — ABNORMAL HIGH (ref 0.3–1.2)
Total Protein: 8.2 g/dL — ABNORMAL HIGH (ref 6.5–8.1)

## 2021-08-01 LAB — RESP PANEL BY RT-PCR (FLU A&B, COVID) ARPGX2
Influenza A by PCR: NEGATIVE
Influenza B by PCR: NEGATIVE
SARS Coronavirus 2 by RT PCR: NEGATIVE

## 2021-08-01 MED ORDER — LORAZEPAM 2 MG/ML IJ SOLN: 0.5000 mg | Freq: Once | INTRAMUSCULAR | Status: DC

## 2021-08-01 MED ORDER — AMOXICILLIN-POT CLAVULANATE 875-125 MG PO TABS: 1.0000 | ORAL_TABLET | Freq: Two times a day (BID) | ORAL | 0 refills | Status: AC

## 2021-08-01 MED ORDER — SODIUM ZIRCONIUM CYCLOSILICATE 10 G PO PACK
10.0000 g | PACK | Freq: Every day | ORAL | Status: DC
  Administered 2021-08-01: 10 g via ORAL
  Filled 2021-08-01: qty 1

## 2021-08-01 MED ORDER — AMOXICILLIN-POT CLAVULANATE 875-125 MG PO TABS: 1.0000 | ORAL_TABLET | Freq: Two times a day (BID) | ORAL | 0 refills | Status: DC

## 2021-08-01 NOTE — ED Notes (Signed)
Pt ambulatory to bathroom w/o assist 

## 2021-08-01 NOTE — ED Provider Notes (Signed)
[facility] DEPT Provider Note   CSN: [id number] Arrival date & time: 08/01/21  1638     History  Chief Complaint  Patient presents with   Epistaxis   Headache    Cathy Bond is a 51 y.o. female.  HPI     51 year old female with a history of diabetes, hypertension, seasonal allergies, anxiety, presents with concern for epistaxis and headaches.  One week off and on having headache and some blood from nose--not blood running out of nose but if puts finger or blows nose will be some blood in it  Sharp pain on the right parietoccipital area Coughing, dry cough, for about 2 days, one day couldn't stop coughing No sore throat, no congestion along with the blood. When blows nose is like watery and blood, doesn't otherwise have congestion No fever, body aches, nausea, vomiting, diarrhea Has had fatigue and a little aching in stomach but history of ulcers and sometimes gets that when eating certain things.  No urinary symptoms Finger cramping, arm pain Denies weakness, difficulty talking or walking, visual changes or facial droop.  Will occasionally wake up with right arm tingling.  Has some sensation of numbness, or odd feeling to the right side of her face. 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 Authorizing Provider  losartan (COZAAR) 50 MG tablet TAKE 1 TABLET(50 MG) BY MOUTH DAILY 05/21/21   Simmons-Robinson, Riki Sheer, MD  amoxicillin-clavulanate (AUGMENTIN) 875-125 MG tablet Take 1 tablet by mouth every 12 (twelve) hours for 7 days. 08/01/21  08/08/21  Gareth Morgan, MD  cetirizine (ZYRTEC) 10 MG tablet Take 1 tablet (10 mg total) by mouth daily. 09/28/20   Simmons-Robinson, Makiera, MD  EPINEPHrine 0.3 mg/0.3 mL IJ SOAJ injection Inject 0.3 mLs (0.3 mg total) into the muscle as needed for anaphylaxis. 09/27/19   Zenia Resides, MD  glucose blood (ONETOUCH ULTRA) test strip TEST BLOOD SUGAR TWICE DAILY 04/25/21   Simmons-Robinson, Riki Sheer, MD  JARDIANCE 25 MG TABS tablet TAKE 1 TABLET BY MOUTH DAILY 04/25/21   Simmons-Robinson, Riki Sheer, MD  Lancets (ONETOUCH ULTRASOFT) lancets Use as instructed 09/28/20   Simmons-Robinson, Makiera, MD  Multiple Vitamin (MULITIVITAMIN WITH MINERALS) TABS Take 1 tablet by mouth daily.    [provider]  Multiple Vitamins-Minerals (HAIR/SKIN/NAILS PO) Take 2 tablets by mouth 2 (two) times daily.    [provider]  nitroGLYCERIN (NITROSTAT) 0.3 MG SL tablet Place 1 tablet (0.3 mg total) under the tongue every 5 (five) minutes as needed for chest pain. 09/27/19   Zenia Resides, MD  OZEMPIC, 0.25 OR 0.5 MG/DOSE, 2 MG/1.5ML SOPN INJECT 0.5MG  INTO THE SKIN ONCE A WEEK 05/09/21   Simmons-Robinson, Riki Sheer, MD  rosuvastatin (CRESTOR) 20 MG tablet Take 1 tablet (20 mg total) by mouth daily. 09/28/20   Simmons-Robinson, Riki Sheer, MD      Allergies    Apple, Shellfish allergy, Wellbutrin [bupropion hcl], and Metformin and related    Review of Systems   Review of Systems  Physical Exam Updated Vital Signs BP 114/71 (BP Location: Left Arm)    Pulse 76    Temp 98.3 F (36.8 C) (Oral)    Resp 16    SpO2 98%  Physical Exam Vitals and nursing note reviewed.  Constitutional:      General: She is not in acute distress.    Appearance: Normal appearance. She is well-developed. She is not ill-appearing or diaphoretic.  HENT:     Head: Normocephalic and atraumatic.  Eyes:     General: No visual field deficit.    Extraocular Movements: Extraocular movements intact.     Conjunctiva/sclera:  Conjunctivae normal.     Pupils: Pupils are equal, round, and reactive to light.  Cardiovascular:     Rate and Rhythm: Normal rate and regular rhythm.     Pulses: Normal pulses.     Heart sounds: Normal heart sounds. No murmur heard.   No friction rub. No gallop.  Pulmonary:     Effort: Pulmonary effort is normal. No respiratory distress.     Breath sounds: Normal breath sounds. No wheezing or rales.  Abdominal:     General: There is no distension.     Palpations: Abdomen is soft.     Tenderness: There is no abdominal tenderness. There is no guarding.  Musculoskeletal:        General: No swelling or tenderness.     Cervical back: Normal range of motion.  Skin:    General: Skin is warm and dry.     Findings: No erythema or rash.  Neurological:     General: No focal deficit present.     Mental Status: She is alert and oriented to person, place, and time.     GCS: GCS eye subscore is 4. GCS verbal subscore is 5. GCS motor subscore is 6.     Cranial Nerves: No cranial nerve deficit, dysarthria or facial asymmetry.     Sensory: No sensory deficit.     Motor: No weakness or tremor.     Coordination: Coordination normal. Finger-Nose-Finger Test normal.     Gait: Gait normal.    ED Results / Procedures / Treatments   Labs (all labs ordered are listed, but only abnormal results are displayed) Labs Reviewed  COMPREHENSIVE METABOLIC PANEL - Abnormal; Notable for the following components:      Result Value   Sodium 133 (*)    Potassium 5.2 (*)    Glucose, Bld 216 (*)    Total Protein 8.2 (*)    AST 60 (*)    Total Bilirubin 1.4 (*)    All other components within normal limits  CBC WITH DIFFERENTIAL/PLATELET - Abnormal; Notable for the following components:   MCV 79.2 (*)    All other components within normal limits  RESP PANEL BY RT-PCR (FLU A&B, COVID) [id number]  CBC WITH DIFFERENTIAL/PLATELET    EKG EKG Interpretation  Date/Time:  Wednesday August 01 2021 18:04:16  EST Ventricular Rate:  80 PR Interval:  169 QRS Duration: 89 QT Interval:  366 QTC Calculation: 423 R Axis:   63 Text Interpretation: Sinus rhythm Probable left atrial enlargement Minimal ST elevation, inferior leads No significant change since last tracing Confirmed by Gareth Morgan [id number]) on 08/01/2021 10:20:40 PM  Radiology CT Head Wo Contrast  Result Date: 08/01/2021 CLINICAL DATA:  Provided history: Headache, new or worsening. Additional history provided: Nose bleeds, headaches intermittently for several days. EXAM: CT HEAD WITHOUT CONTRAST TECHNIQUE: Contiguous axial images were obtained from the base of  the skull through the vertex without intravenous contrast. RADIATION DOSE REDUCTION: This exam was performed according to the departmental dose-optimization program which includes automated exposure control, adjustment of the mA and/or kV according to patient size and/or use of iterative reconstruction technique. COMPARISON:  Brain MRI 11/30/2011. FINDINGS: Brain: Cerebral volume is normal. There is no acute intracranial hemorrhage. No demarcated cortical infarct. No extra-axial fluid collection. No evidence of an intracranial mass. No midline shift. Vascular: No hyperdense vessel. Skull: Normal. Negative for fracture or focal lesion. Sinuses/Orbits: Visualized orbits show no acute finding. Minimal mucosal thickening within the bilateral maxillary sinuses at the imaged levels. IMPRESSION: Unremarkable non-contrast CT appearance of the brain. No evidence of acute intracranial abnormality. Minimal mucosal thickening within the bilateral maxillary sinuses at the imaged levels. Electronically Signed   By: Kellie Simmering D.O.   On: 08/01/2021 18:05   MR BRAIN WO CONTRAST  Result Date: 08/01/2021 CLINICAL DATA:  Initial evaluation for acute numbness, tingling, paresthesias. EXAM: MRI HEAD WITHOUT CONTRAST TECHNIQUE: Multiplanar, multiecho pulse sequences of the brain and surrounding structures were  obtained without intravenous contrast. COMPARISON:  Head CT from earlier the same day. FINDINGS: Brain: Cerebral volume within normal limits for patient age. No focal parenchymal signal abnormality identified. No abnormal foci of restricted diffusion to suggest acute or subacute ischemia. Gray-white matter differentiation well maintained. No encephalomalacia to suggest chronic infarction. No foci of susceptibility artifact to suggest acute or chronic intracranial hemorrhage. No mass lesion, midline shift or mass effect. No hydrocephalus. No extra-axial fluid collection. Pituitary gland and suprasellar region are normal. Midline structures intact and normal. Vascular: Major intracranial vascular flow voids are maintained. Skull and upper cervical spine: Craniocervical junction within normal limits. Bone marrow signal intensity mildly heterogeneous without focal marrow replacing lesion. No scalp soft tissue abnormality. Sinuses/Orbits: Globes and orbital soft tissues within normal limits. Mild mucosal thickening noted within the ethmoidal air cells. Paranasal sinuses are otherwise clear. No mastoid effusion. Inner ear structures grossly normal. Other: None. IMPRESSION: Normal brain MRI. No acute intracranial abnormality identified. Electronically Signed   By: Jeannine Boga M.D.   On: 08/01/2021 21:58   DG Chest Portable 1 View  Result Date: 08/01/2021 CLINICAL DATA:  Cough. EXAM: PORTABLE CHEST 1 VIEW COMPARISON:  09/06/2012 FINDINGS: Lung volumes are low.The cardiomediastinal contours are normal. The lungs are clear. Pulmonary vasculature is normal. No consolidation, pleural effusion, or pneumothorax. No acute osseous abnormalities are seen. IMPRESSION: Low lung volumes without acute abnormality. Electronically Signed   By: Keith Rake M.D.   On: 08/01/2021 17:59    Procedures Procedures    Medications Ordered in ED Medications - No data to display  ED Course/ Medical Decision Making/ A&P                            Medical Decision Making    51 year old female with a history of diabetes, hypertension, seasonal allergies, anxiety, presents with concern for blood from nose, fatigue, cough, and headaches. Also reports left face with numbness.   Labs reviewed show normal hgb, normal platelets.   No sign of significant electrolyte abnormalities as etiology of fatigue. Mild hyperglycemia and hyperkalemia. Given lokelma.  DDX for headache includes ICH, dural venous thrombosis, CVA, mass, infection. No fever, doubt meningitis or encephalitis.   CT head completed shows no sign of intracranial hemorrhage.  Does show some mucosal thickening in the bilateral maxillary sinuses.  MRI brain done given sensation of numbness to  the right side of the face shows no sign of acute CVA or other abnormalities.  Its possible that the blood coming from her nose, altered sensation on the right side of her face and headache are secondary to acute bacterial sinusitis.  Will treat with Augmentin.  She also has nasal polyps on exam, will refer to ENT for further evaluation.          Final Clinical Impression(s) / ED Diagnoses Final diagnoses:  Acute nonintractable headache, unspecified headache type  Acute maxillary sinusitis, recurrence not specified  Nasal polyp    Rx / DC Orders ED Discharge Orders          Ordered    amoxicillin-clavulanate (AUGMENTIN) 875-125 MG tablet  Every 12 hours,   Status:  Discontinued        08/01/21 2218    amoxicillin-clavulanate (AUGMENTIN) 875-125 MG tablet  Every 12 hours        08/01/21 2234              Gareth Morgan, MD 08/02/21 1531

## 2021-08-01 NOTE — ED Notes (Signed)
Pt provided with sandwich and drink 

## 2021-08-01 NOTE — ED Triage Notes (Signed)
Per patient, states she has been having nose bleeds and headaches of and off for a couple of days-states she tried to see PCP this afternoon but they sent her here because she didn't have an appointment-states she takes BP med-states she also has an electric heater she has been using in her apartment that might be contributing to drying out her sinuses

## 2021-08-03 ENCOUNTER — Ambulatory Visit (INDEPENDENT_AMBULATORY_CARE_PROVIDER_SITE_OTHER): Payer: Medicare Other | Admitting: Family Medicine

## 2021-08-03 ENCOUNTER — Other Ambulatory Visit: Payer: Self-pay

## 2021-08-03 ENCOUNTER — Encounter: Payer: Self-pay | Admitting: Family Medicine

## 2021-08-03 VITALS — BP 117/80 | HR 71 | Ht 64.0 in | Wt 188.0 lb

## 2021-08-03 DIAGNOSIS — R040 Epistaxis: Secondary | ICD-10-CM

## 2021-08-03 DIAGNOSIS — J339 Nasal polyp, unspecified: Secondary | ICD-10-CM | POA: Diagnosis not present

## 2021-08-03 DIAGNOSIS — E119 Type 2 diabetes mellitus without complications: Secondary | ICD-10-CM

## 2021-08-03 LAB — POCT GLYCOSYLATED HEMOGLOBIN (HGB A1C): HbA1c, POC (controlled diabetic range): 10.8 % — AB (ref 0.0–7.0)

## 2021-08-03 MED ORDER — MOMETASONE FUROATE 50 MCG/ACT NA SUSP: 2.0000 | Freq: Every day | NASAL | 12 refills | Status: DC

## 2021-08-03 NOTE — Assessment & Plan Note (Signed)
Checking A1c today per patient request.  Follow-up for separate appointment to discuss in depth.

## 2021-08-03 NOTE — Patient Instructions (Addendum)
It was nice seeing you today!  Use a humidifier. You can apply vaseline to help.  Use Nasonex after bleeding has stopped for a few days.  Schedule a separate visit to discuss diabetes.  Stay well, Zola Button, MD [facility] [phone]  --  Make sure to check out at the front desk before you leave today.  Please arrive at least 15 minutes prior to your scheduled appointments.  If you had blood work today, I will send you a MyChart message or a letter if results are normal. Otherwise, I will give you a call.  If you had a referral placed, they will call you to set up an appointment. Please give [place] a call if you don't hear back in the next 2 weeks.  If you need additional refills before your next appointment, please call your pharmacy first.

## 2021-08-03 NOTE — Progress Notes (Signed)
° ° °  SUBJECTIVE:   CHIEF COMPLAINT / HPI: ED follow-up  Seen 2 days ago in the ED on 1/11 for epistaxis and headaches.  CT head was unremarkable other than mucosal thickening in the bilateral maxillary sinuses.  MRI brain was also done which did not show any acute abnormalities.  Treated for bacterial sinusitis with Augmentin.  Also referred to ENT given nasal polyps.  Had another episode of epistaxis this morning which resolved with packing with tissue and leaning her head back, otherwise, no further episodes. Still having mild headache. Taking antibiotics as prescribed. Unsure if she needs ENT evaluation.  PERTINENT  PMH / PSH: HTN, T2DM, ASCUS, bipolar disorder, obesity  OBJECTIVE:   BP 117/80    Pulse 71    Ht 5\' 4"  (1.626 m)    Wt 188 lb (85.3 kg)    SpO2 96%    BMI 32.27 kg/m   General: alert, NAD HEENT: possible nasal polyp noted in left naris CV: RRR, no murmurs Pulm: CTAB, no wheezes or rales  ASSESSMENT/PLAN:   Epistaxis Nasal polyps Epistaxis appears to be improving.  Completing treatment for bacterial sinusitis. - humidifier - Vaseline to nares - start Nasonex for polyps after epistaxis is controlled - advised to proceed with ENT eval if not improving  Type 2 diabetes mellitus without complication, without long-term current use of insulin (HCC) Checking A1c today per patient request.  Follow-up for separate appointment to discuss in depth.     Zola Button, MD [place]

## 2021-08-08 ENCOUNTER — Encounter: Payer: Self-pay | Admitting: Family Medicine

## 2021-08-16 ENCOUNTER — Other Ambulatory Visit: Payer: Self-pay

## 2021-08-16 ENCOUNTER — Ambulatory Visit (INDEPENDENT_AMBULATORY_CARE_PROVIDER_SITE_OTHER): Payer: Medicare Other

## 2021-08-16 DIAGNOSIS — Z0000 Encounter for general adult medical examination without abnormal findings: Secondary | ICD-10-CM | POA: Diagnosis not present

## 2021-08-16 NOTE — Progress Notes (Addendum)
Subjective:   Cathy Bond is a 51 y.o. female who presents for Medicare Annual (Subsequent) preventive examination.  Patient consented to have virtual visit and was identified by name and date of birth. Method of visit: Telephone  Encounter participants: Patient: Cathy Bond - located at Home Nurse/Provider: Dorna Bloom - located at [facility] Others (if applicable): NA  Review of Systems: Defer to PCP  Cardiac Risk Factors include: diabetes mellitus;hypertension  Objective:   Vitals: There were no vitals taken for this visit.  There is no height or weight on file to calculate BMI.  Advanced Directives 08/16/2021 08/03/2021 10/12/2020 12/22/2017 03/25/2017 06/28/2016 04/01/2016  Does Patient Have a Medical Advance Directive? No No No No No No No  Would patient like information on creating a medical advance directive? Yes (MAU/Ambulatory/Procedural Areas - Information given) No - Patient declined No - Patient declined No - Patient declined No - Patient declined No - Patient declined -  Pre-existing out of facility DNR order (yellow form or pink MOST form) - - - - - - -   Tobacco Social History   Tobacco Use  Smoking Status Former   Packs/day: 1.00   Years: 6.00   Pack years: 6.00   Types: Cigarettes   Quit date: 01/19/2014   Years since quitting: 7.5  Smokeless Tobacco Never  Tobacco Comments   No plans to restart.   Tobacco comments: No plans to restart.  Clinical Intake:  Pre-visit preparation completed: Yes  How often do you need to have someone help you when you read instructions, pamphlets, or other written materials from your doctor or pharmacy?: 2 - Rarely What is the last grade level you completed in school?: 10th grade  Past Medical History:  Diagnosis Date   Anxiety    BV (bacterial vaginosis)    Depression    Diabetes mellitus    Hypertension    Seasonal allergies    Trichomonas    Yeast vaginitis    Past Surgical History:  Procedure Laterality Date    endometrial biopsy  2 in 2013   negative   toe nail     TUBAL LIGATION     Family History  Problem Relation Age of Onset   Diabetes Mother    Stroke Mother    Cancer Father    Depression Sister    Alcohol abuse Sister    Diabetes Sister    Depression Brother    Diabetes Brother    Social History   Socioeconomic History   Marital status: Single    Spouse name: Not on file   Number of children: 5   Years of education: 10   Highest education level: 10th grade  Occupational History   Occupation: Retired    Fish farm manager: [employer]  Tobacco Use   Smoking status: Former    Packs/day: 1.00    Years: 6.00    Pack years: 6.00    Types: Cigarettes    Quit date: 01/19/2014    Years since quitting: 7.5   Smokeless tobacco: Never   Tobacco comments:    No plans to restart.  Substance and Sexual Activity   Alcohol use: No   Drug use: No    Comment: No hx of IV use   Sexual activity: Yes    Birth control/protection: Surgical  Other Topics Concern   Not on file  Social History Narrative   Patients live with finance in [place].    Patient has  5 children and multiple grandchildren.    They all live in surrounding areas of [place]. Close relationships.    Patient enjoys bowling, painting, family, gardening and sewing.       Social Determinants of Health   Financial Resource Strain: Low Risk    Difficulty of Paying Living Expenses: Not hard at all  Food Insecurity: No Food Insecurity   Worried About Charity fundraiser in the Last Year: Never true   [place] in the Last Year: Never true  Transportation Needs: No Transportation Needs   Lack of Transportation (Medical): No   Lack of Transportation (Non-Medical): No  Physical Activity: Insufficiently Active   Days of Exercise per Week: 3 days   Minutes of Exercise per Session: 30 min  Stress: Stress Concern Present   Feeling of Stress : To some extent  Social Connections: Moderately Integrated    Frequency of Communication with Friends and Family: More than three times a week   Frequency of Social Gatherings with Friends and Family: More than three times a week   Attends Religious Services: More than 4 times per year   Active Member of Clubs or Organizations: No   Attends Archivist Meetings: Never   Marital Status: Living with partner   Outpatient Encounter Medications as of 08/16/2021  Medication Sig   cetirizine (ZYRTEC) 10 MG tablet Take 1 tablet (10 mg total) by mouth daily.   EPINEPHrine 0.3 mg/0.3 mL IJ SOAJ injection Inject 0.3 mLs (0.3 mg total) into the muscle as needed for anaphylaxis.   JARDIANCE 25 MG TABS tablet TAKE 1 TABLET BY MOUTH DAILY   losartan (COZAAR) 50 MG tablet TAKE 1 TABLET(50 MG) BY MOUTH DAILY   mometasone (NASONEX) 50 MCG/ACT nasal spray Place 2 sprays into the nose daily.   Multiple Vitamin (MULITIVITAMIN WITH MINERALS) TABS Take 1 tablet by mouth daily.   nitroGLYCERIN (NITROSTAT) 0.3 MG SL tablet Place 1 tablet (0.3 mg total) under the tongue every 5 (five) minutes as needed for chest pain.   OZEMPIC, 0.25 OR 0.5 MG/DOSE, 2 MG/1.5ML SOPN INJECT 0.5MG  INTO THE SKIN ONCE A WEEK   rosuvastatin (CRESTOR) 20 MG tablet Take 1 tablet (20 mg total) by mouth daily.   glucose blood (ONETOUCH ULTRA) test strip TEST BLOOD SUGAR TWICE DAILY (Patient not taking: Reported on 08/16/2021)   Lancets (ONETOUCH ULTRASOFT) lancets Use as instructed (Patient not taking: Reported on 08/16/2021)   Multiple Vitamins-Minerals (HAIR/SKIN/NAILS PO) Take 2 tablets by mouth 2 (two) times daily. (Patient not taking: Reported on 08/16/2021)   No facility-administered encounter medications on file as of 08/16/2021.   Activities of Daily Living In your present state of health, do you have any difficulty performing the following activities: 08/16/2021  Hearing? N  Vision? N  Difficulty concentrating or making decisions? N  Walking or climbing stairs? Y  Dressing or bathing?  N  Doing errands, shopping? N  Preparing Food and eating ? N  Using the Toilet? N  Do you have problems with loss of bowel control? N  Managing your Medications? N  Managing your Finances? N  Housekeeping or managing your Housekeeping? N  Some recent data might be hidden   Patient Care Team: Eulis Foster, MD as PCP - General (Family Medicine)    Assessment:   This is a routine wellness examination for Cathy Bond.  Exercise Activities and Dietary recommendations Current Exercise Habits: Home exercise routine, Type of exercise: walking, Time (Minutes): 30, Frequency (Times/Week): 3, Weekly  Exercise (Minutes/Week): 90, Exercise limited by: orthopedic condition(s)   Goals      HEMOGLOBIN A1C < 7     10.8 on 08/03/2021       Fall Risk Fall Risk  08/16/2021 12/22/2017 03/15/2016  Falls in the past year? 0 No No  Number falls in past yr: 0 - -  Injury with Fall? 0 - -  Risk for fall due to : Orthopedic patient - -  Follow up Falls prevention discussed - -   Patient reports normal gait and balance. Patient does not use assistive devices to ambulate.   Is the patient's home free of loose throw rugs in walkways, pet beds, electrical cords, etc?   yes      Grab bars in the bathroom? yes      Handrails on the stairs?   yes      Adequate lighting?   yes  Patient rating of health (0-10) scale: 8  Depression Screen PHQ 2/9 Scores 08/16/2021 08/03/2021 10/12/2020 09/28/2020  PHQ - 2 Score 0 0 0 0  PHQ- 9 Score 6 7 - 2    Cognitive Function 6CIT Screen 08/16/2021  What Year? 0 points  What month? 0 points  What time? 0 points  Count back from 20 0 points  Months in reverse 0 points  Repeat phrase 0 points  Total Score 0   Immunization History  Administered Date(s) Administered   Pneumococcal Polysaccharide-23 10/27/2014   Td 12/25/2003   Qualifies for Shingles Vaccine? Yes   Shingrix Completed: No, Education has been provided regarding the importance of this vaccine.  Advised may receive this vaccine at local pharmacy or Health Dept. Aware to provide a copy of the vaccination record if obtained from local pharmacy or Health Dept. Verbalized acceptance and understanding.   Screening Tests Health Maintenance  Topic Date Due   Hepatitis C Screening  Never done   OPHTHALMOLOGY EXAM  01/25/2014   COLONOSCOPY (Pts 45-35yrs Insurance coverage will need to be confirmed)  Never done   Zoster Vaccines- Shingrix (1 of 2) Never done   FOOT EXAM  09/19/2020   INFLUENZA VACCINE  10/19/2021 (Originally 02/19/2021)   COVID-19 Vaccine (1) 10/19/2021 (Originally 10/23/1971)   HEMOGLOBIN A1C  01/31/2022   MAMMOGRAM  02/01/2023   PAP SMEAR-Modifier  09/29/2023   TETANUS/TDAP  10/25/2024   HIV Screening  Completed   HPV VACCINES  Aged Out   Cancer Screenings: Lung: Low Dose CT Chest recommended if Age 62-80 years, 20 pack-year currently smoking OR have quit w/in 15years. Patient does qualify. Breast:  Up to date on Mammogram? Yes   Up to date of Bone Density/Dexa? NA Colorectal: Patients reports colonoscopy ~3 years ago. Will plan to obtain records.  Cervical: last Pap Smear 09/28/2020  Additional Screenings: HIV Screening: Completed   Plan:  Apt with Dr. Nancy Fetter tomorrow.  Fill out ROI for Colonoscopy report.  Ask for Opthalmology referral at next PCP visit.   I have personally reviewed and noted the following in the patients chart:   Medical and social history Use of alcohol, tobacco or illicit drugs  Current medications and supplements Functional ability and status Nutritional status Physical activity Advanced directives List of other physicians Hospitalizations, surgeries, and ER visits in previous 12 months Vitals Screenings to include cognitive, depression, and falls Referrals and appointments  In addition, I have reviewed and discussed with patient certain preventive protocols, quality metrics, and best practice recommendations. A written personalized  care plan for preventive services as  well as general preventive health recommendations were provided to patient.  This visit was conducted virtually in the setting of the [place] pandemic.    Dorna Bloom, CMA  08/16/2021    I have reviewed this visit and agree with the documentation.

## 2021-08-16 NOTE — Patient Instructions (Addendum)
It was nice seeing you today!  Take Mounjaro 5 mg weekly injection after you finish your Ozempic.  Referrals for eye doctor and nutritionist.  Shingles vaccine at your pharmacy, you will need your second shot 2-6 months after the first one.  Blood work today.  Stay well, Cathy Button, MD [place] [phone]  --  Make sure to check out at the front desk before you leave today.  Please arrive at least 15 minutes prior to your scheduled appointments.  If you had blood work today, I will send you a MyChart message or a letter if results are normal. Otherwise, I will give you a call.  If you had a referral placed, they will call you to set up an appointment. Please give [place] a call if you don't hear back in the next 2 weeks.  If you need additional refills before your next appointment, please call your pharmacy first.

## 2021-08-16 NOTE — Patient Instructions (Signed)
You spoke to Cathy Bond, [place] over the phone for your annual wellness visit.  We discussed goals:   Goals      HEMOGLOBIN A1C < 7     10.8 on 08/03/2021       We also discussed recommended health maintenance. As discussed, you are due for:  Health Maintenance  Topic Date Due   Hepatitis C Screening  Never done   OPHTHALMOLOGY EXAM  01/25/2014   COLONOSCOPY (Pts 45-72yrs Insurance coverage will need to be confirmed)  Never done   Zoster Vaccines- Shingrix (1 of 2) Never done   FOOT EXAM  09/19/2020   INFLUENZA VACCINE  10/19/2021 (Originally 02/19/2021)   COVID-19 Vaccine (1) 10/19/2021 (Originally 10/23/1971)   HEMOGLOBIN A1C  01/31/2022   MAMMOGRAM  02/01/2023   PAP SMEAR-Modifier  09/29/2023   TETANUS/TDAP  10/25/2024   HIV Screening  Completed   HPV VACCINES  Aged Out   Apt with Dr. Nancy Fetter tomorrow.  Fill out ROI for Colonoscopy report.  Ask for Opthalmology referral at next PCP visit.   Preventive Care 36-57 Years Old, Female Preventive care refers to lifestyle choices and visits with your health care provider that can promote health and wellness. Preventive care visits are also called wellness exams. What can I expect for my preventive care visit? Counseling Your health care provider may ask you questions about your: Medical history, including: Past medical problems. Family medical history. Pregnancy history. Current health, including: Menstrual cycle. Method of birth control. Emotional well-being. Home life and relationship well-being. Sexual activity and sexual health. Lifestyle, including: Alcohol, nicotine or tobacco, and drug use. Access to firearms. Diet, exercise, and sleep habits. Work and work Statistician. Sunscreen use. Safety issues such as seatbelt and bike helmet use. Physical exam Your health care provider will check your: Height and weight. These may be used to calculate your BMI (body mass index). BMI is a measurement that tells if you are at a  healthy weight. Waist circumference. This measures the distance around your waistline. This measurement also tells if you are at a healthy weight and may help predict your risk of certain diseases, such as type 2 diabetes and high blood pressure. Heart rate and blood pressure. Body temperature. Skin for abnormal spots. What immunizations do I need? Vaccines are usually given at various ages, according to a schedule. Your health care provider will recommend vaccines for you based on your age, medical history, and lifestyle or other factors, such as travel or where you work. What tests do I need? Screening Your health care provider may recommend screening tests for certain conditions. This may include: Lipid and cholesterol levels. Diabetes screening. This is done by checking your blood sugar (glucose) after you have not eaten for a while (fasting). Pelvic exam and Pap test. Hepatitis B test. Hepatitis C test. HIV (human immunodeficiency virus) test. STI (sexually transmitted infection) testing, if you are at risk. Lung cancer screening. Colorectal cancer screening. Mammogram. Talk with your health care provider about when you should start having regular mammograms. This may depend on whether you have a family history of breast cancer. BRCA-related cancer screening. This may be done if you have a family history of breast, ovarian, tubal, or peritoneal cancers. Bone density scan. This is done to screen for osteoporosis. Talk with your health care provider about your test results, treatment options, and if necessary, the need for more tests. Follow these instructions at home: Eating and drinking  Eat a diet that includes fresh fruits  and vegetables, whole grains, lean protein, and low-fat dairy products. Take vitamin and mineral supplements as recommended by your health care provider. Do not drink alcohol if: Your health care provider tells you not to drink. You are pregnant, may be  pregnant, or are planning to become pregnant. If you drink alcohol: Limit how much you have to 0-1 drink a day. Know how much alcohol is in your drink. In the [place] one drink equals one 12 oz bottle of beer (355 mL), one 5 oz glass of wine (148 mL), or one 1 oz glass of hard liquor (44 mL). Lifestyle Brush your teeth every morning and night with fluoride toothpaste. Floss one time each day. Exercise for at least 30 minutes 5 or more days each week. Do not use any products that contain nicotine or tobacco. These products include cigarettes, chewing tobacco, and vaping devices, such as e-cigarettes. If you need help quitting, ask your health care provider. Do not use drugs. If you are sexually active, practice safe sex. Use a condom or other form of protection to prevent STIs. If you do not wish to become pregnant, use a form of birth control. If you plan to become pregnant, see your health care provider for a prepregnancy visit. Take aspirin only as told by your health care provider. Make sure that you understand how much to take and what form to take. Work with your health care provider to find out whether it is safe and beneficial for you to take aspirin daily. Find healthy ways to manage stress, such as: Meditation, yoga, or listening to music. Journaling. Talking to a trusted person. Spending time with friends and family. Minimize exposure to UV radiation to reduce your risk of skin cancer. Safety Always wear your seat belt while driving or riding in a vehicle. Do not drive: If you have been drinking alcohol. Do not ride with someone who has been drinking. When you are tired or distracted. While texting. If you have been using any mind-altering substances or drugs. Wear a helmet and other protective equipment during sports activities. If you have firearms in your house, make sure you follow all gun safety procedures. Seek help if you have been physically or sexually abused. What's  next? Visit your health care provider once a year for an annual wellness visit. Ask your health care provider how often you should have your eyes and teeth checked. Stay up to date on all vaccines. This information is not intended to replace advice given to you by your health care provider. Make sure you discuss any questions you have with your health care provider. Document Revised: 01/03/2021 Document Reviewed: 01/03/2021 Elsevier Patient Education  [place].   Diet Recommendations for Diabetes   1. Eat at least 3 meals and 1-2 snacks per day. Never go more than 4-5 hours while awake without eating. Eat breakfast within the first hour of getting up.   2. Limit starchy foods to TWO per meal and ONE per snack. ONE portion of a starchy  food is equal to the following:   - ONE slice of bread (or its equivalent, such as half of a hamburger bun).   - 1/2 cup of a "scoopable" starchy food such as potatoes or rice.   - 15 grams of Total Carbohydrate as shown on food label.  3. Include at every meal: a protein food, a carb food, and vegetables and/or fruit.   - Obtain twice the volume of vegetables as protein or carbohydrate  foods for both lunch and dinner.   - Fresh or frozen vegetables are best.   - Keep frozen vegetables on hand for a quick vegetable serving.       Starchy (carb) foods: Bread, rice, pasta, potatoes, corn, cereal, grits, crackers, bagels, muffins, all baked goods.  (Fruits, milk, and yogurt also have carbohydrate, but most of these foods will not spike your blood sugar as most starchy foods will.)  A few fruits do cause high blood sugars; use small portions of bananas (limit to 1/2 at a time), grapes, watermelon, oranges, and most tropical fruits.    Protein foods: Meat, fish, poultry, eggs, dairy foods, and beans such as pinto and kidney beans (beans also provide carbohydrate).      Our clinic's number is [phone]. Please call with questions or concerns about  what we discussed today.

## 2021-08-16 NOTE — Progress Notes (Signed)
° ° °  SUBJECTIVE:   CHIEF COMPLAINT / HPI:  Chief Complaint  Patient presents with   Follow-up    DM    Reports epistaxis is improved with humidifier. Has only had one minor episode since last visit.  T2DM Meds include empagliflozin 25 mg, semaglutide 0.5 mg weekly She has had difficulty getting Ozempic due to pharmacy stocking issues. She states she has been using it for the past 3 weeks but went the two prior months without it.  Tolerating well, denies nausea and other GI issues. Reports she has been eating more sweets (chocolate pies) Fasting sugars 170s Needs new test strips. Prior adverse reaction to metformin. Hesitant to start insulin.  PERTINENT  PMH / PSH: T2DM, bipolar disorder, HTN, obesity  Patient Care Team: Eulis Foster, MD as PCP - General (Family Medicine)   OBJECTIVE:   BP 100/60    Pulse 90    Ht 5\' 4"  (1.626 m)    Wt 190 lb 4 oz (86.3 kg)    SpO2 96%    BMI 32.66 kg/m   Physical Exam Constitutional:      General: She is not in acute distress. Cardiovascular:     Rate and Rhythm: Normal rate and regular rhythm.  Pulmonary:     Effort: Pulmonary effort is normal. No respiratory distress.     Breath sounds: Normal breath sounds.  Musculoskeletal:     Cervical back: Neck supple.  Neurological:     Mental Status: She is alert.     Depression screen [facility] 2/9 08/17/2021  Decreased Interest 0  Down, Depressed, Hopeless 0  PHQ - 2 Score 0  Altered sleeping 2  Tired, decreased energy 2  Change in appetite 0  Feeling bad or failure about yourself  0  Trouble concentrating -  Moving slowly or fidgety/restless 1  Suicidal thoughts 0  PHQ-9 Score 5     {Show previous vital signs (HALPFXTK):24097}  Last metabolic panel Lab Results  Component Value Date   GLUCOSE 216 (H) 08/01/2021   NA 133 (L) 08/01/2021   K 5.2 (H) 08/01/2021   CL 100 08/01/2021   CO2 27 08/01/2021   BUN 18 08/01/2021   CREATININE 0.81 08/01/2021   GFRNONAA >60  08/01/2021   CALCIUM 8.9 08/01/2021   PROT 8.2 (H) 08/01/2021   ALBUMIN 4.1 08/01/2021   LABGLOB 3.0 09/28/2020   AGRATIO 1.4 09/28/2020   BILITOT 1.4 (H) 08/01/2021   ALKPHOS 93 08/01/2021   AST 60 (H) 08/01/2021   ALT 30 08/01/2021   ANIONGAP 6 08/01/2021   Last hemoglobin A1c Lab Results  Component Value Date   HGBA1C 10.8 (A) 08/03/2021    ASSESSMENT/PLAN:   Type 2 diabetes mellitus without complication, without long-term current use of insulin (HCC) Uncontrolled, worsening in the setting of dietary indiscretion and medication nonadherence due to supply issues. - continue empagliflozin - switch Ozempic to tirzepatide 5 mg weekly after finishing Ozempic (one more dose) (discussed with Nicholas Lose, RPH-CPP) - test strips reordered - counseled on diet and exercise - referral MNT - f/u pharmacy clinic 2 weeks - next A1c 3 months   Transaminitis Noted at recent ED visit - recheck CMP  HCM - refer optho diabetic eye exam - shingles vaccine ordered to pharmacy - HCV screen today  Return in about 2 weeks (around 08/31/2021) for pharmacy clinic DM.   Zola Button, MD [place]

## 2021-08-17 ENCOUNTER — Other Ambulatory Visit: Payer: Self-pay

## 2021-08-17 ENCOUNTER — Encounter: Payer: Self-pay | Admitting: Family Medicine

## 2021-08-17 ENCOUNTER — Ambulatory Visit (INDEPENDENT_AMBULATORY_CARE_PROVIDER_SITE_OTHER): Payer: Medicare Other | Admitting: Family Medicine

## 2021-08-17 VITALS — BP 100/60 | HR 90 | Ht 64.0 in | Wt 190.2 lb

## 2021-08-17 DIAGNOSIS — Z0000 Encounter for general adult medical examination without abnormal findings: Secondary | ICD-10-CM

## 2021-08-17 DIAGNOSIS — Z1159 Encounter for screening for other viral diseases: Secondary | ICD-10-CM | POA: Diagnosis not present

## 2021-08-17 DIAGNOSIS — E119 Type 2 diabetes mellitus without complications: Secondary | ICD-10-CM | POA: Diagnosis not present

## 2021-08-17 DIAGNOSIS — R7401 Elevation of levels of liver transaminase levels: Secondary | ICD-10-CM | POA: Diagnosis not present

## 2021-08-17 MED ORDER — FREESTYLE PRECISION NEO TEST VI STRP: ORAL_STRIP | 12 refills | Status: DC

## 2021-08-17 MED ORDER — ZOSTER VAC RECOMB ADJUVANTED 50 MCG/0.5ML IM SUSR: 0.5000 mL | Freq: Once | INTRAMUSCULAR | 0 refills | Status: AC

## 2021-08-17 MED ORDER — TIRZEPATIDE 5 MG/0.5ML ~~LOC~~ SOAJ: 5.0000 mg | SUBCUTANEOUS | 1 refills | Status: DC

## 2021-08-17 NOTE — Assessment & Plan Note (Signed)
Uncontrolled, worsening in the setting of dietary indiscretion and medication nonadherence due to supply issues. - continue empagliflozin - switch Ozempic to tirzepatide 5 mg weekly after finishing Ozempic (one more dose) (discussed with Nicholas Lose, RPH-CPP) - test strips reordered - counseled on diet and exercise - referral MNT - f/u pharmacy clinic 2 weeks - next A1c 3 months

## 2021-08-18 LAB — COMPREHENSIVE METABOLIC PANEL
ALT: 13 IU/L (ref 0–32)
AST: 17 IU/L (ref 0–40)
Albumin/Globulin Ratio: 1.6 (ref 1.2–2.2)
Albumin: 4.6 g/dL (ref 3.8–4.9)
Alkaline Phosphatase: 115 IU/L (ref 44–121)
BUN/Creatinine Ratio: 23 (ref 9–23)
BUN: 19 mg/dL (ref 6–24)
Bilirubin Total: 0.3 mg/dL (ref 0.0–1.2)
CO2: 22 mmol/L (ref 20–29)
Calcium: 9.6 mg/dL (ref 8.7–10.2)
Chloride: 98 mmol/L (ref 96–106)
Creatinine, Ser: 0.83 mg/dL (ref 0.57–1.00)
Globulin, Total: 2.9 g/dL (ref 1.5–4.5)
Glucose: 306 mg/dL — ABNORMAL HIGH (ref 70–99)
Potassium: 3.9 mmol/L (ref 3.5–5.2)
Sodium: 136 mmol/L (ref 134–144)
Total Protein: 7.5 g/dL (ref 6.0–8.5)
eGFR: 85 mL/min/{1.73_m2} (ref >59)

## 2021-08-18 LAB — HCV AB W REFLEX TO QUANT PCR: HCV Ab: <0.1 s/co ratio (ref 0.0–0.9)

## 2021-08-18 LAB — HCV INTERPRETATION

## 2021-08-20 ENCOUNTER — Encounter: Payer: Self-pay | Admitting: Family Medicine

## 2021-09-03 ENCOUNTER — Ambulatory Visit (INDEPENDENT_AMBULATORY_CARE_PROVIDER_SITE_OTHER): Payer: Medicare Other | Admitting: Pharmacist

## 2021-09-03 ENCOUNTER — Other Ambulatory Visit: Payer: Self-pay

## 2021-09-03 DIAGNOSIS — E119 Type 2 diabetes mellitus without complications: Secondary | ICD-10-CM

## 2021-09-03 MED ORDER — FREESTYLE PRECISION NEO TEST VI STRP: ORAL_STRIP | 12 refills | Status: DC

## 2021-09-03 MED ORDER — FREESTYLE LANCETS MISC: 12 refills | Status: AC

## 2021-09-03 NOTE — Progress Notes (Signed)
Subjective:    Patient ID: Cathy Bond, female    DOB: 1970/09/23, 51 y.o.   MRN: [id number]  HPI Patient is a 51 y.o. female who presents for diabetes management. She is in good spirits and presents without assistance. Patient was referred and last seen by Primary Care Provider on 08/17/21.  Patient reports diabetes was diagnosed around 10 years ago.   Insurance coverage/medication affordability: [employer] Medicare   Current diabetes medications include: Mounjaeo 5mg  once weekly on Fridays (completed 1 dose), Jardiance 25mg  once daily Current hypertension medications include: losartan 50mg  once daily Current hyperlipidemia medications include: rosuvastatin 20mg  once daily Patient states that She is taking her medications as prescribed. Patient reports adherence with medications.   Do you feel that your medications are working for you?  yes  Have you been experiencing any side effects to the medications prescribed? no  Do you have any problems obtaining medications due to transportation or finances?  no     Patient reported dietary habits:  Eats 2 meals/day  Breakfast: cereal + fruit  Lunch: skips Dinner: baked protein, crackers, vegetables Snacks: very rarely will have chips Drinks: water  Patient-reported exercise habits: planning to join [employer]   Patient denies hypoglycemic events. Patient reports polyuria (increased urination).  Patient denies polyphagia (increased appetite).  Patient reports polydipsia (increased thirst).  Patient denies neuropathy (nerve pain). Patient denies visual changes. Patient reports self foot exams.   Home fasting blood sugars:  119, 139, 142, 165  Objective:   Labs:   Physical Exam Neurological:     Mental Status: She is alert and oriented to person, place, and time.    Review of Systems  Gastrointestinal:  Negative for abdominal pain, nausea and vomiting.   Lab Results  Component Value Date   HGBA1C 10.8 (A) 08/03/2021    HGBA1C 8.0 (A) 09/28/2020   HGBA1C 6.7 04/06/2020    There were no vitals filed for this visit.  No results found for: MICRALBCREAT  Lipid Panel     Component Value Date/Time   CHOL 179 09/28/2020 1128   TRIG 87 09/28/2020 1128   HDL 65 09/28/2020 1128   CHOLHDL 2.8 09/28/2020 1128   CHOLHDL 4.1 10/26/2014 1109   VLDL 14 10/26/2014 1109   LDLCALC 98 09/28/2020 1128    Clinical Atherosclerotic Cardiovascular Disease (ASCVD): No  The 10-year ASCVD risk score (Arnett DK, et al., 2019) is: 2.7%   Values used to calculate the score:     Age: 63 years     Sex: Female     Is Non-Hispanic African American: Yes     Diabetic: Yes     Tobacco smoker: No     Systolic Blood Pressure: 875 mmHg     Is BP treated: Yes     HDL Cholesterol: 65 mg/dL     Total Cholesterol: 179 mg/dL   Assessment/Plan:   T2DM is not controlled but improving based on glucometer readings likely due to removing barrier to access of Ozempic by switching to [facility] and patient improving dietary choices. Medication adherence appears optimal. Patient is not checking blood glucose daily and patient reported having issues obtaining test strips from pharmacy therefore will re-send test strips and instructed patient to test twice daily. Following instruction patient verbalized understanding of treatment plan.    Continued GLP-1/GIP Mounjaro 5mg  once weekly and can titrate in 3 weeks Continued SGLT2-I Jardiance 25mg  once daily Extensively discussed pathophysiology of diabetes, dietary effects on blood sugar control, and recommended lifestyle  interventions. Counseled on s/sx of and management of hypoglycemia Next A1C anticipated April 2023.   Follow-up appointment 3 weeks to review sugar readings. Written patient instructions provided.  This appointment required 30 minutes of direct patient care.  Thank you for involving pharmacy to assist in providing this patient's care.

## 2021-09-03 NOTE — Patient Instructions (Signed)
Miss Cathy Bond it was a pleasure seeing you today.   Please do the following:  Continue your medications as directed today during your appointment. If you have any questions or if you believe something has occurred because of this change, call me or your doctor to let one of [place] know.  Continue checking blood sugars at home. It's really important that you record these and bring these in to your next doctor's appointment.  Continue making the lifestyle changes we've discussed together during our visit. Diet and exercise play a significant role in improving your blood sugars.  Please call the clinic if you have issues getting your strips or lancets from the pharmacy Follow-up with Dr. Valentina Lucks on 3/6 at 10:30 AM    Hypoglycemia or low blood sugar:   Low blood sugar can happen quickly and may become an emergency if not treated right away.   While this shouldn't happen often, it can be brought upon if you skip a meal or do not eat enough. Also, if your insulin or other diabetes medications are dosed too high, this can cause your blood sugar to go to low.   Warning signs of low blood sugar include: Feeling shaky or dizzy Feeling weak or tired  Excessive hunger Feeling anxious or upset  Sweating even when you aren't exercising  What to do if I experience low blood sugar? Follow the Rule of 15 Check your blood sugar with your meter. If lower than 70, proceed to step 2.  Treat with 15 grams of fast acting carbs which is found in 3-4 glucose tablets. If none are available you can try hard candy, 1 tablespoon of sugar or honey,4 ounces of fruit juice, or 6 ounces of REGULAR soda.  Re-check your sugar in 15 minutes. If it is still below 70, do what you did in step 2 again. If your blood sugar has come back up, go ahead and eat a snack or small meal made up of complex carbs (ex. Whole grains) and protein at this time to avoid recurrence of low blood sugar.

## 2021-09-03 NOTE — Assessment & Plan Note (Signed)
°  T2DM is not controlled but improving based on glucometer readings likely due to removing barrier to access of Ozempic by switching to [facility] and patient improving dietary choices. Medication adherence appears optimal. Patient is not checking blood glucose daily and patient reported having issues obtaining test strips from pharmacy therefore will re-send test strips and instructed patient to test twice daily. Following instruction patient verbalized understanding of treatment plan.    1. Continued GLP-1/GIP Mounjaro 5mg  once weekly and can titrate in 3 weeks 2. Continued SGLT2-I Jardiance 25mg  once daily 3. Extensively discussed pathophysiology of diabetes, dietary effects on blood sugar control, and recommended lifestyle interventions. 4. Counseled on s/sx of and management of hypoglycemia 5. Next A1C anticipated April 2023.   Follow-up appointment 3 weeks to review sugar readings

## 2021-09-18 ENCOUNTER — Encounter: Payer: Medicare Other | Attending: Family Medicine | Admitting: Skilled Nursing Facility1

## 2021-09-18 ENCOUNTER — Other Ambulatory Visit: Payer: Self-pay

## 2021-09-18 ENCOUNTER — Encounter: Payer: Self-pay | Admitting: Skilled Nursing Facility1

## 2021-09-18 DIAGNOSIS — Z713 Dietary counseling and surveillance: Secondary | ICD-10-CM | POA: Insufficient documentation

## 2021-09-18 DIAGNOSIS — E119 Type 2 diabetes mellitus without complications: Secondary | ICD-10-CM | POA: Insufficient documentation

## 2021-09-18 NOTE — Progress Notes (Signed)
Pt states she has cut out sweets and sodas for the last 2 weeks stating it is going pretty well. Pt states she does check her blood pressures sometimes stating she does have HTN.   Pt state she has had diabetes for about 10 years.  Pt states her goal is to be 165 pounds.  Pt state she checks her blood sugars: fasting 120-143 Pt states she cleans the house and up and moving all throughout the day. Pt states the mounjaro is reducing her appetite to the pont of skipping dinner.   Pt states she is allergic to apple juice and shellfish.    Pt states shes very particular about the cleanliness of the house: anxiety was discussed   Prescriptions for DM: [place] Sizer  Diabetes Self-Management Education  Visit Type: First/Initial   09/19/2021  Cathy Bond, identified by name and date of birth, is a 51 y.o. female with a diagnosis of Diabetes: Type 2.   ASSESSMENT  Height 5\' 6"  (1.676 m), weight 186 lb (84.4 kg). Body mass index is 30.02 kg/m.   Diabetes Self-Management Education - 09/18/21 1112       Visit Information   Visit Type First/Initial      Initial Visit   Diabetes Type Type 2    Are you currently following a meal plan? No    Are you taking your medications as prescribed? Yes      Health Coping   How would you rate your overall health? Fair      Psychosocial Assessment   Patient Belief/Attitude about Diabetes Motivated to manage diabetes    Self-care barriers None    Self-management support Family    Other persons present Spouse/SO    Patient Concerns Nutrition/Meal planning;Weight Control    Special Needs None    Learning Readiness Ready      Pre-Education Assessment   Patient understands the diabetes disease and treatment process. Needs Instruction    Patient understands incorporating nutritional management into lifestyle. Needs Instruction    Patient undertands incorporating physical activity into lifestyle. Needs Instruction    Patient  understands using medications safely. Needs Instruction    Patient understands monitoring blood glucose, interpreting and using results Needs Instruction    Patient understands prevention, detection, and treatment of acute complications. Needs Instruction    Patient understands prevention, detection, and treatment of chronic complications. Needs Instruction    Patient understands how to develop strategies to address psychosocial issues. Needs Instruction    Patient understands how to develop strategies to promote health/change behavior. Needs Instruction      Complications   Last HgB A1C per patient/outside source 10 %    How often do you check your blood sugar? 1-2 times/day    Fasting Blood glucose range (mg/dL) 70-129;130-179    Number of hypoglycemic episodes per month 0    Number of hyperglycemic episodes per week 1    Can you tell when your blood sugar is high? Yes    What do you do if your blood sugar is high? drink water    Have you had a dilated eye exam in the past 12 months? No    Have you had a dental exam in the past 12 months? No    Are you checking your feet? Yes    How many days per week are you checking your feet? 7      Dietary Intake   Breakfast spinach and egg and sausage or raisin bran or cheerios  Lunch chicken salad + pita crackers    Snack (afternoon) watermelon    Beverage(s) water      Exercise   Exercise Type ADL's;Light (walking / raking leaves)      Patient Education   Previous Diabetes Education No    Disease state  Definition of diabetes, type 1 and 2, and the diagnosis of diabetes;Factors that contribute to the development of diabetes    Nutrition management  Role of diet in the treatment of diabetes and the relationship between the three main macronutrients and blood glucose level;Food label reading, portion sizes and measuring food.;Reviewed blood glucose goals for pre and post meals and how to evaluate the patients' food intake on their blood  glucose level.    Physical activity and exercise  Role of exercise on diabetes management, blood pressure control and cardiac health.    Medications Taught/reviewed insulin injection, site rotation, insulin storage and needle disposal.    Monitoring Purpose and frequency of SMBG.;Taught/evaluated SMBG meter.;Daily foot exams;Identified appropriate SMBG and/or A1C goals.    Chronic complications Relationship between chronic complications and blood glucose control;Dental care;Lipid levels, blood glucose control and heart disease    Psychosocial adjustment Worked with patient to identify barriers to care and solutions;Role of stress on diabetes;Identified and addressed patients feelings and concerns about diabetes;Brainstormed with patient on coping mechanisms for social situations, getting support from significant others, dealing with feelings about diabetes    Personal strategies to promote health Lifestyle issues that need to be addressed for better diabetes care      Individualized Goals (developed by patient)   Nutrition Follow meal plan discussed;General guidelines for healthy choices and portions discussed    Physical Activity Exercise 5-7 days per week;45 minutes per day    Monitoring  test my blood glucose as discussed;test blood glucose pre and post meals as discussed      Post-Education Assessment   Patient understands the diabetes disease and treatment process. Demonstrates understanding / competency    Patient understands incorporating nutritional management into lifestyle. Demonstrates understanding / competency    Patient undertands incorporating physical activity into lifestyle. Demonstrates understanding / competency    Patient understands using medications safely. Demonstrates understanding / competency    Patient understands monitoring blood glucose, interpreting and using results Demonstrates understanding / competency    Patient understands prevention, detection, and treatment of  acute complications. Demonstrates understanding / competency    Patient understands prevention, detection, and treatment of chronic complications. Demonstrates understanding / competency    Patient understands how to develop strategies to address psychosocial issues. Demonstrates understanding / competency      Outcomes   Expected Outcomes Demonstrated interest in learning. Expect positive outcomes    Future DMSE 4-6 wks    Program Status Completed             Individualized Plan for Diabetes Self-Management Training:   Learning Objective:  Patient will have a greater understanding of diabetes self-management. Patient education plan is to attend individual and/or group sessions per assessed needs and concerns.    Expected Outcomes:  Demonstrated interest in learning. Expect positive outcomes  Education material provided: ADA - How to Thrive: A Guide for Your Journey with Diabetes, Food label handouts, A1C conversion sheet, Meal plan card, and Snack sheet  If problems or questions, patient to contact team via:  Phone and Email  Future DSME appointment: 4-6 wksi

## 2021-09-23 ENCOUNTER — Other Ambulatory Visit: Payer: Self-pay | Admitting: Family Medicine

## 2021-09-24 ENCOUNTER — Encounter: Payer: Self-pay | Admitting: Pharmacist

## 2021-09-24 ENCOUNTER — Ambulatory Visit (INDEPENDENT_AMBULATORY_CARE_PROVIDER_SITE_OTHER): Payer: Medicare Other | Admitting: Pharmacist

## 2021-09-24 ENCOUNTER — Other Ambulatory Visit: Payer: Self-pay | Admitting: Family Medicine

## 2021-09-24 ENCOUNTER — Other Ambulatory Visit: Payer: Self-pay

## 2021-09-24 DIAGNOSIS — E119 Type 2 diabetes mellitus without complications: Secondary | ICD-10-CM | POA: Diagnosis not present

## 2021-09-24 MED ORDER — MOMETASONE FUROATE 50 MCG/ACT NA SUSP
2.0000 | Freq: Every day | NASAL | 12 refills | Status: AC
Start: 2021-09-24 — End: ?

## 2021-09-24 NOTE — Patient Instructions (Addendum)
Great to see you today ? ?Schedule follow-up with Dr. Alba Cory in 4-6 weeks.  ? ? ?Sent you a refill of Naxonex. Use it once or twice daily for nasal congestion.  ? ? ?

## 2021-09-24 NOTE — Progress Notes (Signed)
? ? ?  S:    ? ?Chief Complaint  ?Patient presents with  ? Medication Management  ? ? ?Cathy Bond is a 51 y.o. female who presents for diabetes evaluation, education, and management. Pt arrives >15 min late. Patient was referred and last seen by Primary Care Provider, Dr. Nancy Fetter on 08/17/21 and Dr. Georgina Peer, PharmD on 09/03/21. ? ?Today, She arrives in good spirits and presents without assistance.  ? ?Pt reports tolerating 5 mg Monjaro well without side effects. Reports 5 lb weight loss since initiating therapy.  ? ?Current diabetes medications include: Monjaro 5 mg, Jardiance 25 mg ? ? ?Patient states that She is taking her medications as prescribed. Patient reports adherence with medications.  ? ?Do you feel that your medications are working for you? yes ?Have you been experiencing any side effects to the medications prescribed? no ? ?Patient denies hypoglycemic events. ? ?Patient reported dietary habits: Eats smaller meals now and reports optimizing diet ? ?O:  ?Physical Exam ?Constitutional:   ?   Appearance: Normal appearance.  ?Pulmonary:  ?   Effort: Pulmonary effort is normal.  ?Neurological:  ?   Mental Status: She is alert.  ?Psychiatric:     ?   Mood and Affect: Mood normal.     ?   Behavior: Behavior normal.     ?   Thought Content: Thought content normal.     ?   Judgment: Judgment normal.  ? ? ?Review of Systems  ?All other systems reviewed and are negative. ? ? ?Lab Results  ?Component Value Date  ? HGBA1C 10.8 (A) 08/03/2021  ? ?There were no vitals filed for this visit. ? ?Lipid Panel  ?   ?Component Value Date/Time  ? CHOL 179 09/28/2020 1128  ? TRIG 87 09/28/2020 1128  ? HDL 65 09/28/2020 1128  ? CHOLHDL 2.8 09/28/2020 1128  ? CHOLHDL 4.1 10/26/2014 1109  ? VLDL 14 10/26/2014 1109  ? McRae 98 09/28/2020 1128  ? ? ?A/P: ?Diabetes longstanding for ~10 years currently with good control on current medication regimen. Patient is  able to verbalize appropriate hypoglycemia management plan. Medication  adherence appears optimal.  ?-Continued GLP-1/GIP Monjaro (generic name tirzepatide) 5 mg.  ?-Continued SGLT2-I Jardiance (generic name empagliflozin) at 25 mg. Counseled on sick day rules. ?-Extensively discussed pathophysiology of diabetes, recommended lifestyle interventions, dietary effects on blood sugar control.  ?-Counseled on s/sx of and management of hypoglycemia.  ? ?Pt reported history of allergic rhinitis with congestion currently taking cetirizine QD.  ?- Refilled Nasonex (mometasone) 2 sprays QD.  ? ?Written patient instructions provided. Patient verbalized understanding of treatment plan. Total time in face to face counseling 13 minutes.   ? ?Follow up PCP clinic visit in 4-6 weeks. Patient seen with Earvin Hansen PharmD Candidate. ? ?

## 2021-09-24 NOTE — Assessment & Plan Note (Signed)
Diabetes longstanding for ~10 years currently with good control on current medication regimen. Patient is  able to verbalize appropriate hypoglycemia management plan. Medication adherence appears optimal.  ?-Continued GLP-1/GIP Monjaro (generic name tirzepatide) 5 mg.  ?-Continued SGLT2-I Jardiance (generic name empagliflozin) at 25 mg. Counseled on sick day rules. ?-Extensively discussed pathophysiology of diabetes, recommended lifestyle interventions, dietary effects on blood sugar control.  ?-Counseled on s/sx of and management of hypoglycemia.  ?

## 2021-09-24 NOTE — Progress Notes (Signed)
Reviewed: I agree with Dr. Koval's documentation and management. 

## 2021-10-12 ENCOUNTER — Other Ambulatory Visit: Payer: Self-pay | Admitting: Family Medicine

## 2021-11-01 ENCOUNTER — Encounter: Payer: Medicare Other | Attending: Family Medicine | Admitting: Skilled Nursing Facility1

## 2021-11-01 DIAGNOSIS — E119 Type 2 diabetes mellitus without complications: Secondary | ICD-10-CM | POA: Diagnosis not present

## 2021-11-01 DIAGNOSIS — Z713 Dietary counseling and surveillance: Secondary | ICD-10-CM | POA: Insufficient documentation

## 2021-11-01 NOTE — Progress Notes (Signed)
Assessment:  Patient was seen on  11/01/2021 for individual diabetes education.  ? ?Patient Education Plan per assessed needs and concerns is to attend individual session for Diabetes Self Management Education. ? ?Pt states she is allergic to apple juice and shellfish.   ? ?Pt states shes very particular about the cleanliness of the house: anxiety was discussed  ? ?Pt arrives in pain with her back stating she is being set up with a back therapist.  ?Pt states her numbers have been about 145 fasting in the morning.  ?Pt states she does check her blood pressures with have been about 139/82.  ?Pt states it has been hard to back away from sweets but has been working on it.  ?Pt states if her blood sugars are over 400 she would go to the ED. ?Pt state she just joined [employer] but has not gotten there yet.  ?Pt states tomatoes make her break out. Pt states she likes the skinny girl Raspberry vinaigrette.  ?Pt states she has been doing a lot of yard work and house work for her anxiety.  ? ? ? ?Goals: ?Stop drinking about 2 hours before to avoid urinating int he middle of the night  ?Be sure to eat 3 meals a day to avoid low blood sugar ?Create balanced meals including non starchy vegetables, lean proteins, and complex carbohydrates ?If a slice of pizza for lunch have a salad on the side ?Try kale and carrots with skinny raspberry vinaigrette ?Stop taking the gummy multivitamin and try the capsule with food on your stomach before you take it   ?Keep up with cutting out the sweets you are doing great! ?Great job on limiting your ice cream ?Limit to one ice cream bar per day and only 3 per week  ?Look out for the low blood sugars when you start working out  ? ?Current HbA1c: 10.8 (3 months ago) ? ?Preferred Learning Style:  ?Auditory ?Visual ?Hands on ? ?Learning Readiness:  ?Change in progress ? ?MEDICATIONS: Prescriptions for DM: ?Mounjaro  ?Jardiance ? ?DIETARY INTAKE: ? ?Usual eating pattern includes 2 meals and 2  snacks per day. ? ? ?24 hr recall: ?Breakfast: [place] bacon and spinach eggs ? ?Snack: peanut butter ? ?Dinner: spinach salad with chicken salad ? ?Usual physical activity: cleaning house  ? ?   ?Intervention:  Nutrition counseling provided. ?Discussed diabetes disease process and treatment options.  Discussed physiology of diabetes and role of obesity on insulin resistance.  Encouraged moderate weight reduction to improve glucose levels.  Discussed role of medications and diet in glucose control ?Provided education on macronutrients on glucose levels.  Provided education on carb counting, importance of regularly scheduled meals/snacks, and meal planning ?Discussed effects of physical activity on glucose levels and long-term glucose control.  Recommended 150 minutes of physical activity/week. ?Reviewed patient medications.  Discussed role of medication on blood glucose and possible side effects ?Discussed blood glucose monitoring and interpretation.  Discussed recommended target ranges and individual ranges.   ?Described short-term complications: hyper- and hypo-glycemia.  Discussed causes,symptoms, and treatment options. ?Discussed prevention, detection, and treatment of long-term complications.  Discussed the role of prolonged elevated glucose levels on body systems. ?Discussed role of stress on blood glucose levels and discussed strategies to manage psychosocial issues. ?Discussed recommendations for long-term diabetes self-care.  Established checklist for medical, dental, and emotional self-care. ? ?Teaching Method Utilized:  ?Visual ?Auditory ?Hands on ? ?Barriers to learning/adherence to lifestyle change: anxiety  ? ?Diabetes self-care support plan: ?  NDES support group ? ?Demonstrated degree of understanding via:  Teach Back  ? ?Monitoring/Evaluation:  Dietary intake, exercise: follow up as needed gave card for e-mail or phone contact  ? ? ? ?

## 2021-11-19 DIAGNOSIS — L509 Urticaria, unspecified: Secondary | ICD-10-CM | POA: Diagnosis not present

## 2021-11-20 ENCOUNTER — Telehealth: Payer: Self-pay

## 2021-11-20 NOTE — Telephone Encounter (Signed)
Patient calls nurse line reporting she was seen in the ED in [place] for hives.  ? ?Patient reports she was given #20 Prednisone tabs to take over the course of the week.  ? ?Patient reports she was told to consult her PCP before taking since she is a diabetic. ? ?Patient reports prior to ED visit her home cbgs have been WNL. ? ?Will forward to PCP to advise.  ?

## 2021-11-21 NOTE — Telephone Encounter (Signed)
Called patient to discuss hives and prescriptions from ED visit.  ?Patient reports that hives are improved, she is no longer having pruritis.  ?She did not start the prednisone.  ?Recommended that she continue benadryl through the weekend along with cetirizine daily and on Monday 5/8, start taking benadryl only twice per day and then once daily. Recommended she send MyChart message to follow up with how hives and pruritis do with this. She was agreeable to plan. Advised against starting prednisone.  ?Scheduled for follow up on 12/14/21 ? ?Eulis Foster, MD ?[facility], PGY-3 ?[phone]  ? ?

## 2021-12-02 ENCOUNTER — Other Ambulatory Visit: Payer: Self-pay | Admitting: Family Medicine

## 2021-12-10 NOTE — Progress Notes (Signed)
SUBJECTIVE:   CHIEF COMPLAINT / HPI: f/u for hives, T2DM   Hives Patient presents for follow up regarding recent eruption in hives. She was evaluated in the ED and prescribed a course of prednisone but due to concern for increasing her blood glucose levels, we discussed using benadryl followed by cetirizine. Today she reports that she ended up taking the prednisone for 6 days. She states she sometimes has hives on her buttocks and face. They were not itching.  She recently lost her son who had complications of sickle cell and thinks that stress may have contributed to them returning.   Concern for Nasal Polyp  Patient reports she was told she had a polyp in her nose in the right nostril in the ED. She denies pain. She states that it feels that the nostril is clogged. She reports she previously had nasal bleeding in the past. She reports feeling the sensation that something is hanging in her nostril.   T2DM  She reports she has been walking daily to help with weight loss. She reports adherence to DM medication regimen. She denies episodes of hypoglycemia. She reports some higher BG levels while taking the prednisone, those have returned to normal. She is happy with jardiance and mounjaro.  Health Maintenance reviewed - colonoscopy reported completed in 2021. DM foot exam completed today.   PERTINENT  PMH / PSH:  T2DM  Hives   OBJECTIVE:   BP 110/75   Pulse 92   Ht '5\' 6"'$  (1.676 m)   Wt 176 lb (79.8 kg)   BMI 28.41 kg/m   Physical Exam Constitutional:      Appearance: She is normal weight.  HENT:     Right Ear: External ear normal.     Left Ear: External ear normal.     Nose: Nasal deformity and mucosal edema present.     Right Nostril: Occlusion present. No epistaxis or septal hematoma.     Left Nostril: No epistaxis or septal hematoma.     Right Sinus: No maxillary sinus tenderness or frontal sinus tenderness.     Left Sinus: No maxillary sinus tenderness or frontal sinus  tenderness.     Comments: Appears to have right nasal passage enlarged turbinate or polyp with near occulusion     Mouth/Throat:     Pharynx: Oropharynx is clear.  Cardiovascular:     Rate and Rhythm: Normal rate and regular rhythm.  Pulmonary:     Effort: Pulmonary effort is normal.  Abdominal:     General: Bowel sounds are normal.     Palpations: Abdomen is soft.  Musculoskeletal:     Cervical back: Normal range of motion.  Skin:    Capillary Refill: Capillary refill takes less than 2 seconds.     Comments: No hives visualized on exam today   Neurological:     General: No focal deficit present.     Mental Status: She is alert and oriented to person, place, and time.     ASSESSMENT/PLAN:   Type 2 diabetes mellitus without complication, without long-term current use of insulin (HCC) Measure hemoglobin A1c today Patient will continue Jardiance 25 mg Continue to test blood glucose levels twice daily Continue Mounjaro 0.5 mL/week   Nasal polyp We will refer to ear nose and throat today as this has become more bothersome for the patient Recommended patient use mometasone nasal spray daily   Urticaria Improved on cetirizine Patient will continue daily cetirizine     Eulis Foster, MD  [place]

## 2021-12-13 NOTE — Progress Notes (Signed)
Opened encounter in error   Kristeen Miss, PharmD

## 2021-12-14 ENCOUNTER — Ambulatory Visit (INDEPENDENT_AMBULATORY_CARE_PROVIDER_SITE_OTHER): Payer: Medicare Other | Admitting: Family Medicine

## 2021-12-14 ENCOUNTER — Encounter: Payer: Self-pay | Admitting: Family Medicine

## 2021-12-14 VITALS — BP 110/75 | HR 92 | Ht 66.0 in | Wt 176.0 lb

## 2021-12-14 DIAGNOSIS — E119 Type 2 diabetes mellitus without complications: Secondary | ICD-10-CM | POA: Diagnosis not present

## 2021-12-14 DIAGNOSIS — L509 Urticaria, unspecified: Secondary | ICD-10-CM | POA: Diagnosis not present

## 2021-12-14 DIAGNOSIS — J339 Nasal polyp, unspecified: Secondary | ICD-10-CM | POA: Diagnosis not present

## 2021-12-14 LAB — POCT GLYCOSYLATED HEMOGLOBIN (HGB A1C): HbA1c, POC (controlled diabetic range): 8.8 % — AB (ref 0.0–7.0)

## 2021-12-14 MED ORDER — FREESTYLE PRECISION NEO TEST VI STRP: ORAL_STRIP | 12 refills | Status: DC

## 2021-12-14 NOTE — Assessment & Plan Note (Signed)
Measure hemoglobin A1c today Patient will continue Jardiance 25 mg Continue to test blood glucose levels twice daily Continue Mounjaro 0.5 mL/week

## 2021-12-14 NOTE — Patient Instructions (Signed)
I have submitted a referral to ear nose and throat specialist for your right nasal polyp.  Please be on the look out for a call from them within the next few weeks to schedule an appointment  I have also submitted a referral to podiatry to evaluate for your diabetes foot exams.  We also completed your hemoglobin A1c today.  I will send a prescription for your testing strips to check your blood sugar at home.  Please continue to the cetirizine to manage her hives.

## 2021-12-14 NOTE — Assessment & Plan Note (Signed)
Improved on cetirizine Patient will continue daily cetirizine

## 2021-12-14 NOTE — Assessment & Plan Note (Addendum)
We will refer to ear nose and throat today as this has become more bothersome for the patient Recommended patient use mometasone nasal spray daily

## 2021-12-25 ENCOUNTER — Encounter: Payer: Self-pay | Admitting: *Deleted

## 2022-01-16 ENCOUNTER — Ambulatory Visit: Payer: Medicare Other | Admitting: Podiatry

## 2022-01-28 ENCOUNTER — Other Ambulatory Visit: Payer: Self-pay | Admitting: Family Medicine

## 2022-02-13 ENCOUNTER — Ambulatory Visit (INDEPENDENT_AMBULATORY_CARE_PROVIDER_SITE_OTHER): Payer: Medicare Other | Admitting: Podiatry

## 2022-02-13 ENCOUNTER — Emergency Department (HOSPITAL_COMMUNITY)
Admission: EM | Admit: 2022-02-13 | Discharge: 2022-02-13 | Disposition: A | Discharge status: Home / Self Care | Payer: Medicare Other | Attending: Emergency Medicine | Admitting: Emergency Medicine

## 2022-02-13 ENCOUNTER — Other Ambulatory Visit: Payer: Self-pay

## 2022-02-13 ENCOUNTER — Emergency Department (HOSPITAL_COMMUNITY): Payer: Medicare Other

## 2022-02-13 ENCOUNTER — Encounter (HOSPITAL_COMMUNITY): Payer: Self-pay

## 2022-02-13 DIAGNOSIS — R040 Epistaxis: Secondary | ICD-10-CM | POA: Diagnosis not present

## 2022-02-13 DIAGNOSIS — G43009 Migraine without aura, not intractable, without status migrainosus: Secondary | ICD-10-CM | POA: Insufficient documentation

## 2022-02-13 DIAGNOSIS — E876 Hypokalemia: Secondary | ICD-10-CM | POA: Insufficient documentation

## 2022-02-13 DIAGNOSIS — R519 Headache, unspecified: Secondary | ICD-10-CM | POA: Diagnosis not present

## 2022-02-13 DIAGNOSIS — R739 Hyperglycemia, unspecified: Secondary | ICD-10-CM | POA: Insufficient documentation

## 2022-02-13 DIAGNOSIS — Z91199 Patient's noncompliance with other medical treatment and regimen due to unspecified reason: Secondary | ICD-10-CM

## 2022-02-13 LAB — COMPREHENSIVE METABOLIC PANEL
ALT: 19 U/L (ref 0–44)
AST: 15 U/L (ref 15–41)
Albumin: 4 g/dL (ref 3.5–5.0)
Alkaline Phosphatase: 78 U/L (ref 38–126)
Anion gap: 8 (ref 5–15)
BUN: 15 mg/dL (ref 6–20)
CO2: 26 mmol/L (ref 22–32)
Calcium: 9.3 mg/dL (ref 8.9–10.3)
Chloride: 103 mmol/L (ref 98–111)
Creatinine, Ser: 0.98 mg/dL (ref 0.44–1.00)
GFR, Estimated: >60 mL/min (ref >60)
Glucose, Bld: 285 mg/dL — ABNORMAL HIGH (ref 70–99)
Potassium: 3.4 mmol/L — ABNORMAL LOW (ref 3.5–5.1)
Sodium: 137 mmol/L (ref 135–145)
Total Bilirubin: 0.4 mg/dL (ref 0.3–1.2)
Total Protein: 7.8 g/dL (ref 6.5–8.1)

## 2022-02-13 LAB — PROTIME-INR
INR: 1.1 (ref 0.8–1.2)
Prothrombin Time: 13.6 seconds (ref 11.4–15.2)

## 2022-02-13 LAB — CBC WITH DIFFERENTIAL/PLATELET
Abs Immature Granulocytes: 0.01 10*3/uL (ref 0.00–0.07)
Basophils Absolute: 0.1 10*3/uL (ref 0.0–0.1)
Basophils Relative: 1 %
Eosinophils Absolute: 0.1 10*3/uL (ref 0.0–0.5)
Eosinophils Relative: 2 %
HCT: 36.8 % (ref 36.0–46.0)
Hemoglobin: 12.6 g/dL (ref 12.0–15.0)
Immature Granulocytes: 0 %
Lymphocytes Relative: 36 %
Lymphs Abs: 2 10*3/uL (ref 0.7–4.0)
MCH: 27.5 pg (ref 26.0–34.0)
MCHC: 34.2 g/dL (ref 30.0–36.0)
MCV: 80.2 fL (ref 80.0–100.0)
Monocytes Absolute: 0.2 10*3/uL (ref 0.1–1.0)
Monocytes Relative: 4 %
Neutro Abs: 3.1 10*3/uL (ref 1.7–7.7)
Neutrophils Relative %: 57 %
Platelets: 231 10*3/uL (ref 150–400)
RBC: 4.59 MIL/uL (ref 3.87–5.11)
RDW: 12.7 % (ref 11.5–15.5)
WBC: 5.5 10*3/uL (ref 4.0–10.5)
nRBC: 0 % (ref 0.0–0.2)

## 2022-02-13 LAB — CBG MONITORING, ED: Glucose-Capillary: 160 mg/dL — ABNORMAL HIGH (ref 70–99)

## 2022-02-13 LAB — APTT: aPTT: 23 seconds — ABNORMAL LOW (ref 24–36)

## 2022-02-13 NOTE — ED Provider Notes (Signed)
[facility] DEPT Provider Note   CSN: [id number] Arrival date & time: 02/13/22  1345     History  Chief Complaint  Patient presents with   Epistaxis   Headache    Cathy Bond is a 51 y.o. female.  Intermittent right nasal bleed onset 4-5 months.   ENT 04/10/22 due to polyp.    Onset 3 am this morning. Lasting about 3 seconds and stopped on its own.   On glysterol, allergy pills and nasal spray.   Parietal headache onset 1 week and intermittent.   Blurred vision onset 1 week and acutely worsening. Doesn't wear glasses and no eye doctor.   The history is provided by the patient. No language interpreter was used.  Epistaxis Location:  R nare Timing:  Intermittent Chronicity:  Chronic Context: nose picking   Context: not anticoagulants   Associated symptoms: headaches   Associated symptoms: no congestion and no dizziness   Headache Associated symptoms: photophobia (intermittent)   Associated symptoms: no congestion, no dizziness, no nausea and no vomiting        Home Medications Prior to Admission medications   Medication Sig Start Date End Date Taking? Authorizing Provider  cetirizine (ZYRTEC) 10 MG tablet TAKE 1 TABLET(10 MG) BY MOUTH DAILY 09/24/21   Gladys Damme, MD  EPINEPHrine 0.3 mg/0.3 mL IJ SOAJ injection Inject 0.3 mLs (0.3 mg total) into the muscle as needed for anaphylaxis. Patient not taking: Reported on 09/03/2021 09/27/19   Zenia Resides, MD  glucose blood (FREESTYLE PRECISION NEO TEST) test strip Use as instructed to test blood glucose twice daily 12/14/21   Simmons-Robinson, Riki Sheer, MD  JARDIANCE 25 MG TABS tablet TAKE 1 TABLET BY MOUTH DAILY 04/25/21   Simmons-Robinson, Riki Sheer, MD  Lancets (FREESTYLE) lancets Use as instructed to test blood glucose twice daily 09/03/21   Zenia Resides, MD  losartan (COZAAR) 50 MG tablet TAKE 1 TABLET(50 MG) BY MOUTH DAILY 01/28/22   Eppie Gibson, MD  mometasone (NASONEX)  50 MCG/ACT nasal spray Place 2 sprays into the nose daily. 09/24/21   Zenia Resides, MD  MOUNJARO 5 MG/0.5ML Pen ADMINISTER 0.5 ML UNDER THE SKIN 1 TIME A WEEK 10/15/21   Simmons-Robinson, Riki Sheer, MD  Multiple Vitamin (MULITIVITAMIN WITH MINERALS) TABS Take 1 tablet by mouth daily.    [provider]  Multiple Vitamins-Minerals (HAIR/SKIN/NAILS PO) Take 2 tablets by mouth 2 (two) times daily.    [provider]  nitroGLYCERIN (NITROSTAT) 0.3 MG SL tablet Place 1 tablet (0.3 mg total) under the tongue every 5 (five) minutes as needed for chest pain. Patient not taking: Reported on 09/03/2021 09/27/19   Zenia Resides, MD  rosuvastatin (CRESTOR) 20 MG tablet TAKE 1 TABLET(20 MG) BY MOUTH DAILY 12/03/21   Simmons-Robinson, Makiera, MD      Allergies    Apple juice, Shellfish allergy, Wellbutrin [bupropion hcl], and Metformin and related    Review of Systems   Review of Systems  HENT:  Positive for nosebleeds. Negative for congestion.   Eyes:  Positive for photophobia (intermittent) and visual disturbance.  Gastrointestinal:  Negative for nausea and vomiting.  Neurological:  Positive for headaches. Negative for dizziness and light-headedness.  All other systems reviewed and are negative.   Physical Exam Updated Vital Signs BP 113/81   Pulse 76   Temp 98.5 F (36.9 C)   Resp 16   Ht '5\' 4"'$  (1.626 m)   Wt 78.5 kg   SpO2 97%  BMI 29.70 kg/m  Physical Exam  ED Results / Procedures / Treatments   Labs (all labs ordered are listed, but only abnormal results are displayed) Labs Reviewed  COMPREHENSIVE METABOLIC PANEL - Abnormal; Notable for the following components:      Result Value   Potassium 3.4 (*)    Glucose, Bld 285 (*)    All other components within normal limits  APTT - Abnormal; Notable for the following components:   aPTT 23 (*)    All other components within normal limits  CBC WITH DIFFERENTIAL/PLATELET  PROTIME-INR    EKG None  Radiology CT  Head Wo Contrast  Result Date: 02/13/2022 CLINICAL DATA:  Headache EXAM: CT HEAD WITHOUT CONTRAST TECHNIQUE: Contiguous axial images were obtained from the base of the skull through the vertex without intravenous contrast. RADIATION DOSE REDUCTION: This exam was performed according to the departmental dose-optimization program which includes automated exposure control, adjustment of the mA and/or kV according to patient size and/or use of iterative reconstruction technique. COMPARISON:  08/01/2021 FINDINGS: Brain: No evidence of acute infarction, hemorrhage, mass, mass effect, or midline shift. No hydrocephalus or extra-axial fluid collection. Vascular: No hyperdense vessel. Skull: Normal. Negative for fracture or focal lesion. Sinuses/Orbits: No acute finding. Other: The mastoid air cells are well aerated. IMPRESSION: No acute intracranial process. Electronically Signed   By: Merilyn Baba M.D.   On: 02/13/2022 16:00    Procedures Procedures  {Document cardiac monitor, telemetry assessment procedure when appropriate:1}  Medications Ordered in ED Medications - No data to display  ED Course/ Medical Decision Making/ A&P                           Medical Decision Making  ***  {Document critical care time when appropriate:1} {Document review of labs and clinical decision tools ie heart score, Chads2Vasc2 etc:1}  {Document your independent review of radiology images, and any outside records:1} {Document your discussion with family members, caretakers, and with consultants:1} {Document social determinants of health affecting pt's care:1} {Document your decision making why or why not admission, treatments were needed:1} Final Clinical Impression(s) / ED Diagnoses Final diagnoses:  None    Rx / DC Orders ED Discharge Orders     None

## 2022-02-13 NOTE — ED Notes (Signed)
Pt has urine sample in triage if needed.

## 2022-02-13 NOTE — ED Provider Triage Note (Signed)
Emergency Medicine Provider Triage Evaluation Note  Cathy Bond , a 51 y.o. female  was evaluated in triage.  Pt complains of epistaxis and headaches. Patient states over the past 5 months she has had nosebleeds in the right nare.  She gets these about every other day.  She has never had these before.  She has not been evaluated for them, but does have an upcoming primary care appointment in September. She also is complaining of right-sided parietal headaches that have been intermittent for the past week.  She says they are sharp and she has associated blurry vision in both eyes.  She sometimes feels pain behind her eyes.  She has never had headaches before.  She notes a family history of aneurysms.  Review of Systems  Positive: Epistaxis, headache, blurry vision Negative:   Physical Exam  BP 117/79 (BP Location: Left Arm)   Pulse 83   Temp 98.7 F (37.1 C) (Oral)   Resp 16   Ht '5\' 4"'$  (1.626 m)   Wt 78.5 kg   SpO2 97%   BMI 29.70 kg/m  Gen:   Awake, no distress   Resp:  Normal effort  MSK:   Moves extremities without difficulty  Other:    Medical Decision Making  Medically screening exam initiated at 3:12 PM.  Appropriate orders placed.  Cathy Bond was informed that the remainder of the evaluation will be completed by another provider, this initial triage assessment does not replace that evaluation, and the importance of remaining in the ED until their evaluation is complete.     Cathy Bond, [place] 02/13/22 1514

## 2022-02-13 NOTE — ED Triage Notes (Signed)
Pt states she reports intermittent headaches and nose bleeds for the past several months. States last night she had another nose bleed. No bleeding at this time.

## 2022-02-13 NOTE — ED Notes (Signed)
2nd blue top sent

## 2022-02-13 NOTE — Progress Notes (Signed)
No show

## 2022-02-13 NOTE — Discharge Instructions (Addendum)
It was a pleasure taking care of you today!   Your labs showed slightly elevated blood sugar at 160. Take your medications as prescribed. Ensure to apply pressure to the soft portion of your nose for 20 minutes.  Attached is further information with pictures on how to apply pressure to your nose to stop the bleeding if it occurs again. Call your ENT specialist for further management. Ensure to maintain fluid intake with water. Follow up with your primary care provider as needed. Return to the ED if you are experiencing increasing/worsening headache, vision changes, nausea, nosebleeds, or worsening symptoms.

## 2022-04-07 ENCOUNTER — Other Ambulatory Visit: Payer: Self-pay | Admitting: Family Medicine

## 2022-05-02 ENCOUNTER — Ambulatory Visit: Payer: Self-pay

## 2022-05-02 ENCOUNTER — Telehealth: Payer: Self-pay | Admitting: *Deleted

## 2022-05-02 NOTE — Patient Outreach (Signed)
  Care Coordination   Initial Visit Note   05/02/2022 Name: Cathy Bond MRN: [id number] DOB: 16-May-1971  Cathy Bond is a 51 y.o. year old female who sees Eppie Gibson, MD for primary care. I spoke with  Cathy Bond by phone today.  What matters to the patients health and wellness today?  "I just buried my son and I would like someone to talk to."    Goals Addressed   None     SDOH assessments and interventions completed:  Yes  SDOH Interventions Today    Flowsheet Row Most Recent Value  SDOH Interventions   Housing Interventions Intervention Not Indicated  Transportation Interventions Intervention Not Indicated        Care Coordination Interventions Activated:  Yes  Care Coordination Interventions:  Yes, provided   Follow up plan: Referral made to LCSW    Encounter Outcome:  Pt. Visit Completed

## 2022-05-02 NOTE — Chronic Care Management (AMB) (Signed)
  Care Coordination   Note   05/02/2022 Name: Cathy Bond MRN: [id number] DOB: [birth year]  Cathy Bond is a 51 y.o. year old female who sees Eppie Gibson, MD for primary care. I reached out to Cathy Bond by phone today to offer care coordination services.  Cathy Bond was given information about Care Coordination services today including:   The Care Coordination services include support from the care team which includes your Nurse Coordinator, Clinical Social Worker, or Pharmacist.  The Care Coordination team is here to help remove barriers to the health concerns and goals most important to you. Care Coordination services are voluntary, and the patient may decline or stop services at any time by request to their care team member.   Care Coordination Consent Status: Patient agreed to services and verbal consent obtained.   Follow up plan:  Telephone appointment with care coordination team member scheduled for:  05/03/22  Encounter Outcome:  Pt. Scheduled  [place]  Direct Dial: [phone]

## 2022-05-03 ENCOUNTER — Ambulatory Visit: Payer: Self-pay | Admitting: *Deleted

## 2022-05-03 NOTE — Patient Instructions (Signed)
Visit Information  Thank you for taking time to visit with me today. Please don't hesitate to contact me if I can be of assistance to you.   Following are the goals we discussed today:   Goals Addressed             This Visit's Progress    Reduce symptoms of grief/depression       Care Coordination Interventions: Pt loss of son in May- causing grief/depression Pt shares she lives with her fiance and has 3 daughters and 1 living son who are nearby and "keep in touch" Pt reports she has lost 68 LBS in 3 weeks- "due to stress of losing my son"- will alert RNCM and PCP to this Motivational Interviewing employed Depression screen reviewed  PHQ2/ PHQ9 completed Solution-Focused Strategies employed:  Mindfulness or Relaxation training provided Active listening / Reflection utilized  Emotional Support Provided Problem [place] strategies reviewed Participation in counseling encouraged : pt is open to bereavement counseling through Hospice- will send material to pt to review and consider for follow up           [place] next appointment is by telephone on 05/17/22   Please call the care guide team at [phone] if you need to cancel or reschedule your appointment.   If you are experiencing a Mental Health or [place] or need someone to talk to, please call the Suicide and Crisis Lifeline: 988 call the [place] National Suicide Prevention Lifeline: 412-032-4941 or TTY: 502-321-2141 TTY 484-472-8711) to talk to a trained counselor call 911   The patient verbalized understanding of instructions, educational materials, and care plan provided today and DECLINED offer to receive copy of patient instructions, educational materials, and care plan.   Telephone follow up appointment with care management team member scheduled for:05/17/22  Eduard Clos MSW, LCSW Licensed Clinical Social Worker      [phone]

## 2022-05-03 NOTE — Patient Outreach (Signed)
Care Coordination Interventions: Motivational Interviewing employed Depression screen reviewed  PHQ2/ PHQ9 completed Solution-Focused Strategies employed:  Active listening / Reflection utilized  Emotional Support Provided Problem [place] strategies reviewed Participation in support group encouraged : Hospice grief support- mailing you info on this

## 2022-05-09 ENCOUNTER — Other Ambulatory Visit: Payer: Self-pay | Admitting: Student

## 2022-05-17 ENCOUNTER — Encounter: Payer: Self-pay | Admitting: *Deleted

## 2022-05-21 ENCOUNTER — Telehealth: Payer: Self-pay | Admitting: *Deleted

## 2022-05-21 NOTE — Patient Outreach (Signed)
  Care Coordination   05/21/2022 Name: Cathy Bond MRN: [id number] DOB: 1971/07/10   Care Coordination Outreach Attempts:  An unsuccessful telephone outreach was attempted today to offer the patient information about available care coordination services as a benefit of their health plan.   Follow Up Plan:  Additional outreach attempts will be made to offer the patient care coordination information and services.   Encounter Outcome:  No Answer  Care Coordination Interventions Activated:  No   Care Coordination Interventions:  No, not indicated    Eduard Clos MSW, LCSW Licensed Clinical Social Worker      [phone]

## 2022-05-22 NOTE — Progress Notes (Deleted)
    SUBJECTIVE:   CHIEF COMPLAINT / HPI:   DM2 Last A1c 8.8%. Currently on Mounjaro '5mg'$ /week  PERTINENT  PMH / PSH: ***  OBJECTIVE:   There were no vitals taken for this visit.  ***  ASSESSMENT/PLAN:   No problem-specific Assessment & Plan notes found for this encounter.     Pearla Dubonnet, MD [place]

## 2022-05-23 ENCOUNTER — Ambulatory Visit: Payer: Medicare Other | Admitting: Student

## 2022-06-05 ENCOUNTER — Ambulatory Visit (INDEPENDENT_AMBULATORY_CARE_PROVIDER_SITE_OTHER): Payer: Medicare Other | Admitting: Student

## 2022-06-05 ENCOUNTER — Encounter: Payer: Self-pay | Admitting: Student

## 2022-06-05 VITALS — BP 130/75 | HR 71 | Ht 64.0 in | Wt 176.4 lb

## 2022-06-05 DIAGNOSIS — Z23 Encounter for immunization: Secondary | ICD-10-CM

## 2022-06-05 DIAGNOSIS — E119 Type 2 diabetes mellitus without complications: Secondary | ICD-10-CM | POA: Diagnosis not present

## 2022-06-05 DIAGNOSIS — G8929 Other chronic pain: Secondary | ICD-10-CM | POA: Diagnosis not present

## 2022-06-05 DIAGNOSIS — J339 Nasal polyp, unspecified: Secondary | ICD-10-CM

## 2022-06-05 DIAGNOSIS — L509 Urticaria, unspecified: Secondary | ICD-10-CM | POA: Diagnosis not present

## 2022-06-05 DIAGNOSIS — M549 Dorsalgia, unspecified: Secondary | ICD-10-CM

## 2022-06-05 LAB — POCT GLYCOSYLATED HEMOGLOBIN (HGB A1C): HbA1c, POC (controlled diabetic range): 9.9 % — AB (ref 0.0–7.0)

## 2022-06-05 MED ORDER — MOUNJARO 7.5 MG/0.5ML ~~LOC~~ SOAJ: 7.5000 mg | SUBCUTANEOUS | 2 refills | Status: DC

## 2022-06-05 MED ORDER — DICLOFENAC SODIUM 1 % EX GEL
2.0000 g | Freq: Four times a day (QID) | CUTANEOUS | 2 refills | Status: DC
Start: 2022-06-05 — End: 2023-08-24

## 2022-06-05 MED ORDER — EPINEPHRINE 0.3 MG/0.3ML IJ SOAJ: 0.3000 mg | INTRAMUSCULAR | 1 refills | Status: AC | PRN

## 2022-06-05 MED ORDER — MOUNJARO 10 MG/0.5ML ~~LOC~~ SOAJ: 10.0000 mg | SUBCUTANEOUS | 2 refills | Status: DC

## 2022-06-05 NOTE — Patient Instructions (Signed)
Call Dr. Deeann Saint office at [phone] to reschedule your polyp removal.   We are increasing your Mounjaro to 7.'5mg'$ /week. Make an appt to come back and see me in February AFTER the 15th of the month.   Going to the gym and lifting weights should help with both your A1c and your back pain. I am also sending some voltaren gel to help with the back pain.  Flu shot today. Pnuemonia vaccine at next visit.   Pearla Dubonnet, MD

## 2022-06-05 NOTE — Progress Notes (Addendum)
    SUBJECTIVE:   CHIEF COMPLAINT / HPI:   Diabetes Current meds include Jardiance and Mounjaro. A1c 8.8>9.9 today. Reports binging on sweets since the death of her son about six months ago. She is currently on the '5mg'$  dose of mounjaro and tolerating it well. Checks her sugars at home and the highest she remembers seeing was 170.   Back Pain Chronic issue for her. Was diagnosed with OA. Feels that her pain is progressing. No new bowel/bladder incontinence, leg weakness, saddle anesthesia or weight loss. No injury to the area. Not currently engaged in regular exercise.   Nasal Polyp Was supposed to have resection of R-sided nasal polyp with Dr Benjamine Mola but was unable to make appt. Did not have a number to call and re-schedule with him. Has occasional bleeding on this side but it does not seem to be getting any worse.    OBJECTIVE:   BP 130/75   Pulse 71   Ht '5\' 4"'$  (1.626 m)   Wt 176 lb 6.4 oz (80 kg)   SpO2 99%   BMI 30.28 kg/m   Gen: Alert and engaged in interview and exam, in good spirits HENT: EOMs intact. No cervical lymphadenopathy. Unable to visualize polyp in R nare.  Cardio: RRR, no m/r/g Pulm: Normal WOB on RA, lungs clear in all fields Abd: Soft, flat, non-tender MSK: Back without deformity, diffuse paraspinal tenderness and spasm with deep palpation. Neuro: Gait WNL. Good strength and sensation throughout.   ASSESSMENT/PLAN:   Type 2 diabetes mellitus without complication, without long-term current use of insulin (HCC) A1c worse, but suspect acute stress and dietary indiscretion in the setting of the death of her son plays a big role here. Will not be overly aggressive in increasing her diabetic regimen as I expect this to improve with time. - Continue Jardiance '25mg'$  daily - Increase Mounjaro to 7.'5mg'$ /week - F/u in 3 months for A1c check - Getting flu shot today. Also due for PCV 20. Plans to do this at follow-up.   Back pain Given paraspinal spasm, suspect this is a  strength issue. Discussed introducing resistance training--she is amenable. Will also try some topical voltaren.  - Will re-address at follow-up  Nasal polyp Provided patient with Dr. Deeann Saint office number. She will call to re-schedule removal.      Cathy Dubonnet, MD [place]

## 2022-06-06 ENCOUNTER — Telehealth: Payer: Self-pay | Admitting: *Deleted

## 2022-06-06 NOTE — Assessment & Plan Note (Signed)
Provided patient with Dr. Deeann Saint office number. She will call to re-schedule removal.

## 2022-06-06 NOTE — Assessment & Plan Note (Addendum)
A1c worse, but suspect acute stress and dietary indiscretion in the setting of the death of her son plays a big role here. Will not be overly aggressive in increasing her diabetic regimen as I expect this to improve with time. - Continue Jardiance '25mg'$  daily - Increase Mounjaro to 7.'5mg'$ /week - F/u in 3 months for A1c check - Getting flu shot today. Also due for PCV 20. Plans to do this at follow-up.

## 2022-06-06 NOTE — Assessment & Plan Note (Signed)
Given paraspinal spasm, suspect this is a strength issue. Discussed introducing resistance training--she is amenable. Will also try some topical voltaren.  - Will re-address at follow-up

## 2022-06-06 NOTE — Progress Notes (Signed)
  Care Coordination Note  06/06/2022 Name: TARRI GUILFOIL MRN: [id number] DOB: 1971/07/16  Rolm Gala is a 51 y.o. year old female who is a primary care patient of Eppie Gibson, MD and is actively engaged with the care management team. I reached out to Rolm Gala by phone today to assist with re-scheduling a follow up visit with the Licensed Clinical Social Worker  Follow up plan: Unsuccessful telephone outreach attempt made.    [place]  Direct Dial: [phone]

## 2022-06-12 NOTE — Progress Notes (Signed)
   Note Care Coordination  06/12/2022 Name: KYLEA BERRONG MRN: [id number] DOB: 14-Feb-1971  Rolm Gala is a 51 y.o. year old female who is a primary care patient of Eppie Gibson, MD and is actively engaged with the care management team. I reached out to Rolm Gala by phone today to assist with re-scheduling a follow up visit with the Licensed Clinical Social Worker  Follow up plan: Patient declines further follow up and engagement by the care management team. Appropriate care team members and provider have been notified via electronic communication.   [place]  Direct Dial: [phone]

## 2022-08-22 ENCOUNTER — Other Ambulatory Visit: Payer: Self-pay | Admitting: Student

## 2022-08-22 DIAGNOSIS — E119 Type 2 diabetes mellitus without complications: Secondary | ICD-10-CM

## 2022-09-15 DIAGNOSIS — N3001 Acute cystitis with hematuria: Secondary | ICD-10-CM | POA: Diagnosis not present

## 2022-09-15 DIAGNOSIS — K760 Fatty (change of) liver, not elsewhere classified: Secondary | ICD-10-CM | POA: Diagnosis not present

## 2022-09-15 DIAGNOSIS — R109 Unspecified abdominal pain: Secondary | ICD-10-CM | POA: Diagnosis not present

## 2022-09-16 DIAGNOSIS — R109 Unspecified abdominal pain: Secondary | ICD-10-CM | POA: Diagnosis not present

## 2022-09-26 ENCOUNTER — Ambulatory Visit (INDEPENDENT_AMBULATORY_CARE_PROVIDER_SITE_OTHER): Payer: 59 | Admitting: Family Medicine

## 2022-09-26 ENCOUNTER — Telehealth: Payer: Self-pay

## 2022-09-26 VITALS — BP 116/83 | HR 74 | Ht 64.0 in | Wt 176.0 lb

## 2022-09-26 DIAGNOSIS — R350 Frequency of micturition: Secondary | ICD-10-CM

## 2022-09-26 DIAGNOSIS — K5900 Constipation, unspecified: Secondary | ICD-10-CM

## 2022-09-26 DIAGNOSIS — E119 Type 2 diabetes mellitus without complications: Secondary | ICD-10-CM | POA: Diagnosis not present

## 2022-09-26 DIAGNOSIS — K269 Duodenal ulcer, unspecified as acute or chronic, without hemorrhage or perforation: Secondary | ICD-10-CM | POA: Diagnosis not present

## 2022-09-26 LAB — POCT GLYCOSYLATED HEMOGLOBIN (HGB A1C): HbA1c, POC (controlled diabetic range): 9 % — AB (ref 0.0–7.0)

## 2022-09-26 MED ORDER — POLYETHYLENE GLYCOL 3350 17 GM/SCOOP PO POWD: 17.0000 g | Freq: Every day | ORAL | 0 refills | Status: AC

## 2022-09-26 NOTE — Telephone Encounter (Signed)
LIBERATE Study  Received referral for patient participation in the LIBERATE CGM Study. Contacted patient to discuss study and confirmed HIPAA identifiers. Confirmed patient was provided the LIBERATE Study Information Sheet and any questions were answered.   Confirmed that patient meets study criteria by having a diagnosis of Type 2 Diabetes, is not currently on insulin, and most recent A1c is >8%.  Patient provided verbal consent to participate in the study. Consent documented in electronic medical record.   - Confirmed that patient has a compatible smart phone to download [place] 3 app. ([email or web address]) - Asked to download and create a [facility] account prior to first study visit.  - Scheduled first study visit on 10/02/2022. Confirmed patient has transportation to this appointment.  - Discussed use of MyChart in this study. Confirmed patient has an active MyChart account and is aware of their log in information.  Patient aware of pre-visit questionnaire that will be sent 2 days prior to their scheduled study visit and they will plan to complete before the visit.  Joseph Art, Pharm.D. PGY-2 Ambulatory Care Pharmacy Resident

## 2022-09-26 NOTE — Progress Notes (Signed)
    SUBJECTIVE:   CHIEF COMPLAINT / HPI:   ED follow-up Was seen in the ED on 2/25 for epigastric abdominal pain. Lab work was unremarkable at that time.  CT abdomen pelvis showed prominent pelvic vasculature but otherwise no findings.  Symptoms improved after GI cocktail and Carafate, thought to be gastritis flare.  Was also treated for UTI and yeast infection.  Discharged home with fluconazole, Keflex, Carafate, and Prilosec.  Here today for follow-up. Abdominal pain has resolved.  She does endorse constipation-- going 3 days to 1 week between BM. Previously going every other day at baseline. Sometimes has to strain. Sometimes hard but other times soft. No diarrhea. No blood in stool. No unintentional weight loss (is on mounjaro though). No nausea or vomiting.  Also complains of urinary frequency for a few weeks which did not improve after keflex. No dysuria. No incontinence. No fever. No flank pain.   PERTINENT  PMH / PSH: h/o duodenal ulcer, bipolar disorder, diabetes, HTN  OBJECTIVE:   BP 116/83   Pulse 74   Ht '5\' 4"'$  (1.626 m)   Wt 176 lb (79.8 kg)   SpO2 99%   BMI 30.21 kg/m   Gen: NAD, pleasant, able to participate in exam CV: RRR, normal S1/S2, no murmur Resp: Normal effort, lungs CTAB GI: abdomen soft, nontender, nondistended Extremities: no edema or cyanosis Skin: warm and dry, no rashes noted Neuro: alert, no obvious focal deficits Psych: Normal affect and mood   ASSESSMENT/PLAN:   Constipation No red flags on history. I suspect this may be related to [facility] use. Start Miralax 1 capful daily.  Duodenal ulcer In 2021. Had 8 week trial of PPI. Has not seen GI since that time. Advised she reach out to GI for routine follow-up, especially in light of her recent epigastric pain.  Urinary Frequency Recently treated for UTI. No other symptoms to suggest cystitis therefore will not repeat UA today. Likely related to poor glycemic control- A1c obtained today and was  9.0. Advised PCP f/u as below.  Type 2 diabetes mellitus without complication, without long-term current use of insulin (HCC) Uncontrolled currently. A1c 9.0% today. Advised PCP follow up for diabetes visit     Alcus Dad, MD [employer]

## 2022-09-26 NOTE — Patient Instructions (Addendum)
It was great to see you!  Some of your constipation may be related to [facility] use. I recommend continuing the [facility] and also starting Miralax once daily to help with constipation.  Your frequent urination may be related to your diabetes. We will check an A1c today and I will send you a message in MyChart with the results. Either way, you are due for a diabetes visit-- please see your PCP, Dr Joelyn Oms, at your earliest convenience.   Due to your history of an ulcer, it's a good idea to continue following with a GI doctor regularly. You saw Blinda Leatherwood in 2021. I would reach out to her office for a follow up appointment.  [facility] [address] [place], [place] [address] [phone]   Take care, Dr Rock Nephew

## 2022-09-27 DIAGNOSIS — K5900 Constipation, unspecified: Secondary | ICD-10-CM | POA: Insufficient documentation

## 2022-09-27 NOTE — Assessment & Plan Note (Signed)
Uncontrolled currently. A1c 9.0% today. Advised PCP follow up for diabetes visit

## 2022-09-27 NOTE — Assessment & Plan Note (Signed)
No red flags on history. I suspect this may be related to [facility] use. Start Miralax 1 capful daily.

## 2022-09-27 NOTE — Assessment & Plan Note (Signed)
In 2021. Had 8 week trial of PPI. Has not seen GI since that time. Advised she reach out to GI for routine follow-up, especially in light of her recent epigastric pain.

## 2022-10-02 ENCOUNTER — Encounter: Payer: Self-pay | Admitting: Pharmacist

## 2022-10-02 ENCOUNTER — Encounter (INDEPENDENT_AMBULATORY_CARE_PROVIDER_SITE_OTHER): Payer: 59 | Admitting: Pharmacist

## 2022-10-02 VITALS — BP 130/78 | HR 77 | Wt 176.4 lb

## 2022-10-02 DIAGNOSIS — E119 Type 2 diabetes mellitus without complications: Secondary | ICD-10-CM | POA: Diagnosis not present

## 2022-10-02 LAB — POCT GLYCOSYLATED HEMOGLOBIN (HGB A1C): HbA1c, POC (controlled diabetic range): 8.9 % — AB (ref 0.0–7.0)

## 2022-10-02 NOTE — Progress Notes (Signed)
Reviewed and agree with Dr Koval's plan.   

## 2022-10-02 NOTE — Patient Instructions (Addendum)
It was great to see you today!   We placed a Freestyle Libre Continuous Blood Glucose Monitor today. If the sensor falls off early, put it in a plastic bag and bring back to clinic. Avoid high dose salicylic acid (aspirin) and vitamin C products while wearing sensor. Continue checking blood glucose and taking medications as normal.   Return to office when you have your new phone and we can get you started.

## 2022-10-02 NOTE — Research (Signed)
S:    Chief Complaint  Patient presents with   Medication Management    LIBERATE Potential enrollment   52 y.o. female who presents for diabetes evaluation, education, and management in the context of the LIBERATE Study and potential enrollment.    PMH is significant for hypertension, diabetes, depression, bipolar.  Patient was referred and last seen by Primary Care Provider, Dr. Rock Nephew, on 09/26/2022.  At last visit, patient was seen for a follow-up visit after presenting to the ED for epigastric pain. During the office visit, A1c was found to be 9.0%.   Today, patient arrives in good spirits and presents without any assistance.  Patient reports Diabetes was diagnosed in 2013.   Family/Social History: reports her son passed away in 12-19-2021  Current diabetes medications include: Mounjaro (tirzepatide) 7.5 mg once weekly, Jardiance (empagliflozin) 25 mg once daily Current hypertension medications include: losartan 50 mg once daily Current hyperlipidemia medications include: rosuvastatin 20 mg once daily  Patient reports adherence to taking all medications as prescribed.   Insurance coverage: [employer]  Patient denies hypoglycemic events.  Reports occasionally checking blood sugars levels in the mornings: 140s, none after meals  O:   Review of Systems  Skin:        Reports hair thinning/loss for approximately the last 5 months    Physical Exam Constitutional:      Appearance: Normal appearance.  Pulmonary:     Effort: Pulmonary effort is normal.  Neurological:     Mental Status: She is alert.  Psychiatric:        Mood and Affect: Mood normal.        Behavior: Behavior normal.     Lab Results  Component Value Date   HGBA1C 8.9 (A) 10/02/2022    POC A1c Today: 8.9%  Vitals:   10/02/22 1039  BP: 130/78  Pulse: 77  SpO2: 96%     Lipid Panel     Component Value Date/Time   CHOL 179 09/28/2020 1128   TRIG 87 09/28/2020 1128   HDL 65 09/28/2020 1128    CHOLHDL 2.8 09/28/2020 1128   CHOLHDL 4.1 10/26/2014 1109   VLDL 14 10/26/2014 1109   LDLCALC 98 09/28/2020 1128    Clinical Atherosclerotic Cardiovascular Disease (ASCVD): No  The 10-year ASCVD risk score (Arnett DK, et al., 2019) is: 7.9%   Values used to calculate the score:     Age: 67 years     Sex: Female     Is Non-Hispanic African American: Yes     Diabetic: Yes     Tobacco smoker: No     Systolic Blood Pressure: [id number]     Is BP treated: Yes     HDL Cholesterol: 65 mg/dL     Total Cholesterol: 179 mg/dL   A/P:  LIBERATE Study: Potential candidate -Patient provided verbal consent to participate in the study. Consent documented in electronic medical record.  -During office visit, it was determined that the patient did not have a cell phone compatible with the [place] 3 app. Patient states she is planning to upgrade her phone in approximately 1 month -Encouraged to attempt enrollment in [place] study once she has obtained a phone that is compatible with the [place] 3 app Asked patient to reschedule when her phone is upgraded.  Diabetes diagnosed in 2013 currently not well controlled with an A1c of 8.9%. Patient is able to verbalize appropriate hypoglycemia management plan. Control is suboptimal due to dietary habits and patient dose  not regularly check blood sugar. -Continued GLP-1 Mounjaro (tirzepatide) 7.5 mg once weekly  -Continued SGLT2-I Jardiance (empagliflozin) 25 mg once daily -Patient educated on purpose, proper use, and potential adverse effects -Extensively discussed pathophysiology of diabetes, recommended lifestyle interventions, dietary effects on blood sugar control.  -Counseled on s/sx of and management of hypoglycemia.  -Next A1c anticipated 12/2022.   Written patient instructions provided. Patient verbalized understanding of treatment plan.  Total time in face to face counseling 30 minutes.    Follow-up:  Pharmacist in 2 weeks. Patient seen with Louanne Belton PharmD PGY-1 Pharmacy Resident and Estelle June, PharmD Candidate.

## 2022-10-02 NOTE — Assessment & Plan Note (Signed)
LIBERATE Study: Potential candidate -Patient provided verbal consent to participate in the study. Consent documented in electronic medical record.  -During office visit, it was determined that the patient did not have a cell phone compatible with the [place] 3 app. Patient states she is planning to upgrade her phone in approximately 1 month -Encouraged to attempt enrollment in [place] study once she has obtained a phone that is compatible with the [place] 3 app Asked patient to reschedule when her phone is upgraded.  Diabetes diagnosed in 2013 currently not well controlled with an A1c of 8.9%. Patient is able to verbalize appropriate hypoglycemia management plan. Control is suboptimal due to dietary habits and patient dose not regularly check blood sugar. -Continued GLP-1 Mounjaro (tirzepatide) 7.5 mg once weekly  -Continued SGLT2-I Jardiance (empagliflozin) 25 mg once daily -Patient educated on purpose, proper use, and potential adverse effects -Extensively discussed pathophysiology of diabetes, recommended lifestyle interventions, dietary effects on blood sugar control.  -Counseled on s/sx of and management of hypoglycemia.  -Next A1c anticipated 12/2022.

## 2022-10-16 ENCOUNTER — Ambulatory Visit: Payer: 59 | Admitting: Pharmacist

## 2022-10-21 ENCOUNTER — Ambulatory Visit (INDEPENDENT_AMBULATORY_CARE_PROVIDER_SITE_OTHER): Payer: 59 | Admitting: Student

## 2022-10-21 ENCOUNTER — Encounter: Payer: Self-pay | Admitting: Student

## 2022-10-21 VITALS — BP 117/67 | HR 79 | Wt 174.8 lb

## 2022-10-21 DIAGNOSIS — Z0000 Encounter for general adult medical examination without abnormal findings: Secondary | ICD-10-CM

## 2022-10-21 DIAGNOSIS — E119 Type 2 diabetes mellitus without complications: Secondary | ICD-10-CM | POA: Diagnosis not present

## 2022-10-21 DIAGNOSIS — J301 Allergic rhinitis due to pollen: Secondary | ICD-10-CM

## 2022-10-21 DIAGNOSIS — L659 Nonscarring hair loss, unspecified: Secondary | ICD-10-CM | POA: Diagnosis not present

## 2022-10-21 DIAGNOSIS — J339 Nasal polyp, unspecified: Secondary | ICD-10-CM | POA: Diagnosis not present

## 2022-10-21 MED ORDER — CETIRIZINE HCL 10 MG PO TABS: ORAL_TABLET | ORAL | 3 refills | Status: DC

## 2022-10-21 NOTE — Patient Instructions (Addendum)
I'd like you to get your pneumonia and tetanus vaccines at your pharmacy. Let me know when you've done this. We'll check some blood today to see if we can identify why your hair is falling out. I'll see you back in three weeks for a Pap smear. Make an appointment with Dr. Valentina Lucks on your way out to get your CGM.   Marnee Guarneri, MD

## 2022-10-21 NOTE — Progress Notes (Signed)
    SUBJECTIVE:   CHIEF COMPLAINT / HPI:   DM2 On Mounjaro 7.5mg  Weekly and Jardiance 25mg  daily.  Hesitant to increase Mounjaro dose at this time.   Hair Falling Out Hot/Cold.  Last period at age 52. Hgb nl in 09/27/22. Started after son died.   Nasal Polyps Benjamine Mola will operate   Health Maintenance   Needs Pap (ASCUS>Nl [place] in 2022) Willing to do TDaP  PERTINENT  PMH / PSH: ***  OBJECTIVE:   There were no vitals taken for this visit.  ***  ASSESSMENT/PLAN:   No problem-specific Assessment & Plan notes found for this encounter.     Marnee Guarneri, MD [place]

## 2022-10-22 LAB — TSH RFX ON ABNORMAL TO FREE T4: TSH: 0.426 u[IU]/mL — ABNORMAL LOW (ref 0.450–4.500)

## 2022-10-22 LAB — MICROALBUMIN / CREATININE URINE RATIO
Creatinine, Urine: 52.1 mg/dL
Microalb/Creat Ratio: 7 mg/g creat (ref 0–29)
Microalbumin, Urine: 3.7 ug/mL

## 2022-10-22 LAB — T4F: T4,Free (Direct): 1.37 ng/dL (ref 0.82–1.77)

## 2022-10-22 NOTE — Assessment & Plan Note (Signed)
Pending surgery with Dr. Benjamine Mola

## 2022-10-22 NOTE — Assessment & Plan Note (Signed)
A1c above goal at 8.9%. Patient hesistant to increase Mounjaro at this time. Discussed that at some point she likely will need to go up on this. Already on an SGLT2i.  Will hook her back up with LIBERATE Trial to get CGM. This may help her get in tune with her food choices and perhaps we can avoid Mounjaro increase. - Patient to make f/u appt with pharmacy team

## 2022-10-22 NOTE — Assessment & Plan Note (Signed)
Pt to return in 3 weeks for Pap. Would like breast exam at that visit as well. - Plans to get TDaP and PCV 20 at pharmacy

## 2022-10-29 ENCOUNTER — Other Ambulatory Visit: Payer: Self-pay | Admitting: Family Medicine

## 2022-10-29 ENCOUNTER — Other Ambulatory Visit: Payer: Self-pay | Admitting: Student

## 2022-11-05 ENCOUNTER — Ambulatory Visit (INDEPENDENT_AMBULATORY_CARE_PROVIDER_SITE_OTHER): Payer: Medicare HMO

## 2022-11-05 VITALS — Ht 64.0 in | Wt 174.0 lb

## 2022-11-05 DIAGNOSIS — Z0000 Encounter for general adult medical examination without abnormal findings: Secondary | ICD-10-CM

## 2022-11-05 NOTE — Progress Notes (Cosign Needed)
Subjective:   Cathy Bond is a 52 y.o. female who presents for Medicare Annual (Subsequent) preventive examination.  Review of Systems    Virtual Visit via Telephone Note  I connected with  Cathy Bond on 11/05/22 at  1:30 PM EDT by telephone and verified that I am speaking with the correct person using two identifiers.  Location: Patient: Home Provider: Office Persons participating in the virtual visit: patient/Nurse Health Advisor   I discussed the limitations, risks, security and privacy concerns of performing an evaluation and management service by telephone and the availability of in person appointments. The patient expressed understanding and agreed to proceed.  Interactive audio and video telecommunications were attempted between this nurse and patient, however failed, due to patient having technical difficulties OR patient did not have access to video capability.  We continued and completed visit with audio only.  Some vital signs may be absent or patient reported.   Cathy Rung, LPN  Cardiac Risk Factors include: advanced age (>73men, >76 women);diabetes mellitus;hypertension     Objective:    Today's Vitals   11/05/22 1332 11/05/22 1334  Weight: 174 lb (78.9 kg)   Height: 5\' 4"  (1.626 m)   PainSc:  0-No pain   Body mass index is 29.87 kg/m.     11/05/2022    1:45 PM 09/26/2022   10:28 AM 06/05/2022    9:51 AM 02/13/2022    3:04 PM 12/14/2021    1:28 PM 09/18/2021   11:12 AM 08/17/2021   10:29 AM  Advanced Directives  Does Patient Have a Medical Advance Directive? No No No No No No No  Would patient like information on creating a medical advance directive? No - Patient declined No - Patient declined   No - Patient declined No - Patient declined No - Patient declined    Current Medications (verified) Outpatient Encounter Medications as of 11/05/2022  Medication Sig   cetirizine (ZYRTEC) 10 MG tablet TAKE 1 TABLET(10 MG) BY MOUTH DAILY   diclofenac  Sodium (VOLTAREN) 1 % GEL Apply 2 g topically 4 (four) times daily. (Patient not taking: Reported on 10/02/2022)   EPINEPHrine 0.3 mg/0.3 mL IJ SOAJ injection Inject 0.3 mg into the muscle as needed for anaphylaxis.   glucose blood (FREESTYLE PRECISION NEO TEST) test strip Use as instructed to test blood glucose twice daily   JARDIANCE 25 MG TABS tablet TAKE 1 TABLET BY MOUTH DAILY   Lancets (FREESTYLE) lancets Use as instructed to test blood glucose twice daily   losartan (COZAAR) 50 MG tablet TAKE 1 TABLET(50 MG) BY MOUTH DAILY   mometasone (NASONEX) 50 MCG/ACT nasal spray Place 2 sprays into the nose daily.   MOUNJARO 7.5 MG/0.5ML Pen ADMINISTER 7.5 MG UNDER THE SKIN 1 TIME A WEEK   Multiple Vitamin (MULITIVITAMIN WITH MINERALS) TABS Take 1 tablet by mouth daily.   Multiple Vitamins-Minerals (HAIR/SKIN/NAILS PO) Take 2 tablets by mouth 2 (two) times daily.   nitroGLYCERIN (NITROSTAT) 0.3 MG SL tablet Place 1 tablet (0.3 mg total) under the tongue every 5 (five) minutes as needed for chest pain. (Patient not taking: Reported on 10/02/2022)   polyethylene glycol powder (GLYCOLAX/MIRALAX) 17 GM/SCOOP powder Take 17 g by mouth daily.   rosuvastatin (CRESTOR) 20 MG tablet TAKE 1 TABLET(20 MG) BY MOUTH DAILY   No facility-administered encounter medications on file as of 11/05/2022.    Allergies (verified) Apple juice, Shellfish allergy, Wellbutrin [bupropion hcl], and Metformin and related   History: Past Medical History:  Diagnosis Date   Anxiety    BV (bacterial vaginosis)    Depression    Diabetes mellitus    Hypertension    Seasonal allergies    Trichomonas    Yeast vaginitis    Past Surgical History:  Procedure Laterality Date   endometrial biopsy  2 in 2013   negative   toe nail     TUBAL LIGATION     Family History  Problem Relation Age of Onset   Diabetes Mother    Stroke Mother    Cancer Father    Depression Sister    Alcohol abuse Sister    Diabetes Sister     Depression Brother    Diabetes Brother    Social History   Socioeconomic History   Marital status: Single    Spouse name: Not on file   Number of children: 5   Years of education: 10   Highest education level: 10th grade  Occupational History   Occupation: Retired    Associate Professor: [employer]  Tobacco Use   Smoking status: Former    Packs/day: 1.00    Years: 6.00    Additional pack years: 0.00    Total pack years: 6.00    Types: Cigarettes    Quit date: 01/19/2014    Years since quitting: 8.8   Smokeless tobacco: Never   Tobacco comments:    No plans to restart.  Substance and Sexual Activity   Alcohol use: No   Drug use: No    Comment: No hx of IV use   Sexual activity: Yes    Birth control/protection: Surgical  Other Topics Concern   Not on file  Social History Narrative   Patients live with finance in [place].    Patient has 5 children and multiple grandchildren.    They all live in surrounding areas of [place]. Close relationships.    Patient enjoys bowling, painting, family, gardening and sewing.       Social Determinants of Health   Financial Resource Strain: Low Risk  (11/05/2022)   Overall Financial Resource Strain (CARDIA)    Difficulty of Paying Living Expenses: Not hard at all  Food Insecurity: No Food Insecurity (11/05/2022)   Hunger Vital Sign    Worried About Running Out of Food in the Last Year: Never true    Ran Out of Food in the Last Year: Never true  Transportation Needs: No Transportation Needs (11/05/2022)   PRAPARE - Administrator, Civil Service (Medical): No    Lack of Transportation (Non-Medical): No  Physical Activity: Inactive (11/05/2022)   Exercise Vital Sign    Days of Exercise per Week: 0 days    Minutes of Exercise per Session: 0 min  Stress: No Stress Concern Present (11/05/2022)   [employer] of Occupational Health - Occupational Stress Questionnaire    Feeling of Stress : Only a little  Social  Connections: Moderately Integrated (11/05/2022)   Social Connection and Isolation Panel [NHANES]    Frequency of Communication with Friends and Family: More than three times a week    Frequency of Social Gatherings with Friends and Family: More than three times a week    Attends Religious Services: More than 4 times per year    Active Member of [employer] or Organizations: Yes    Attends Banker Meetings: More than 4 times per year    Marital Status: Never married    Tobacco Counseling Counseling given: Not  Answered Tobacco comments: No plans to restart.   Clinical Intake:  Pre-visit preparation completed: No  Pain : No/denies pain Pain Score: 0-No pain     BMI - recorded: 29.87 Nutritional Risks: None Diabetes: No  How often do you need to have someone help you when you read instructions, pamphlets, or other written materials from your doctor or pharmacy?: 3 - Sometimes (Friend)  Diabetic? Yes  Interpreter Needed?: NoNutrition Risk Assessment:  Has the patient had any N/V/D within the last 2 months?  No  Does the patient have any non-healing wounds?  No  Has the patient had any unintentional weight loss or weight gain?  No   Diabetes:  Is the patient diabetic?  Yes  If diabetic, was a CBG obtained today?  No  Did the patient bring in their glucometer from home?  No  How often do you monitor your CBG's? 2 x daily.   Financial Strains and Diabetes Management:  Are you having any financial strains with the device, your supplies or your medication? No .  Does the patient want to be seen by Chronic Care Management for management of their diabetes?  No  Would the patient like to be referred to a Nutritionist or for Diabetic Management?  No   Diabetic Exams:  Diabetic Eye Exam: Completed . Overdue for diabetic eye exam. Pt has been advised about the importance in completing this exam. A referral has been placed today. Message sent to referral coordinator for  scheduling purposes. Advised pt to expect a call from office referred to regarding appt.  Diabetic Foot Exam: Completed . Pt has been advised about the importance in completing this exam. Pt is scheduled for diabetic foot exam on Followed by PCP.    Information entered by :: Theresa Mulligan LPN   Activities of Daily Living    11/05/2022    1:44 PM  In your present state of health, do you have any difficulty performing the following activities:  Hearing? 0  Vision? 0  Difficulty concentrating or making decisions? 0  Walking or climbing stairs? 0  Dressing or bathing? 0  Doing errands, shopping? 0  Preparing Food and eating ? N  Using the Toilet? N  In the past six months, have you accidently leaked urine? N  Do you have problems with loss of bowel control? N  Managing your Medications? N  Managing your Finances? N  Housekeeping or managing your Housekeeping? N    Patient Care Team: [name], MD as PCP - General (Family Medicine)  Indicate any recent Medical Services you may have received from other than [facility] providers in the past year (date may be approximate).     Assessment:   This is a routine wellness examination for Cathy Bond.  Hearing/Vision screen Hearing Screening - Comments:: Denies hearing difficulties   Vision Screening - Comments:: Wears reading glasses - up to date with routine eye exams with [facility]   Dietary issues and exercise activities discussed: Exercise limited by: None identified   Goals Addressed               This Visit's Progress     No current goals (pt-stated)         Depression Screen    11/05/2022    1:41 PM 10/21/2022    3:54 PM 09/26/2022   10:27 AM 06/05/2022    9:51 AM 05/03/2022   10:25 AM 05/03/2022   10:15 AM 12/14/2021    1:34 PM  PHQ 2/9  Scores  PHQ - 2 Score 1 0 1 5 2 5 4   PHQ- 9 Score 1 2 3 13 8  10     Fall Risk    11/05/2022    1:44 PM 09/18/2021   11:13 AM 08/16/2021    2:30 PM 12/22/2017   10:47 AM 03/15/2016    11:10 AM  Fall Risk   Falls in the past year? 0 0 0 No No  Number falls in past yr: 0  0    Injury with Fall? 0  0    Risk for fall due to : No Fall Risks  Orthopedic patient    Follow up Falls prevention discussed  Falls prevention discussed      FALL RISK PREVENTION PERTAINING TO THE HOME:  Any stairs in or around the home? No  If so, are there any without handrails? No  Home free of loose throw rugs in walkways, pet beds, electrical cords, etc? Yes  Adequate lighting in your home to reduce risk of falls? Yes   ASSISTIVE DEVICES UTILIZED TO PREVENT FALLS:  Life alert? No  Use of a cane, walker or w/c? No  Grab bars in the bathroom? No  Shower chair or bench in shower? No  Elevated toilet seat or a handicapped toilet? No   TIMED UP AND GO:  Was the test performed? No . Audio Visit   Cognitive Function:        11/05/2022    1:45 PM 08/16/2021    2:33 PM  6CIT Screen  What Year? 0 points 0 points  What month? 0 points 0 points  What time? 0 points 0 points  Count back from 20 2 points 0 points  Months in reverse 4 points 0 points  Repeat phrase 4 points 0 points  Total Score 10 points 0 points    Immunizations Immunization History  Administered Date(s) Administered   Influenza,inj,Quad PF,6+ Mos 06/05/2022   Pneumococcal Polysaccharide-23 10/27/2014   Td 12/25/2003    TDAP status: Due, Education has been provided regarding the importance of this vaccine. Advised may receive this vaccine at local pharmacy or Health Dept. Aware to provide a copy of the vaccination record if obtained from local pharmacy or Health Dept. Verbalized acceptance and understanding.  Flu Vaccine status: Up to date    Covid-19 vaccine status: Declined, Education has been provided regarding the importance of this vaccine but patient still declined. Advised may receive this vaccine at local pharmacy or Health Dept.or vaccine clinic. Aware to provide a copy of the vaccination record if  obtained from local pharmacy or Health Dept. Verbalized acceptance and understanding.  Qualifies for Shingles Vaccine? Yes   Zostavax completed No   Shingrix Completed?: No.    Education has been provided regarding the importance of this vaccine. Patient has been advised to call insurance company to determine out of pocket expense if they have not yet received this vaccine. Advised may also receive vaccine at local pharmacy or Health Dept. Verbalized acceptance and understanding.  Screening Tests Health Maintenance  Topic Date Due   DTaP/Tdap/Td (2 - Tdap) 12/24/2013   OPHTHALMOLOGY EXAM  11/05/2022 (Originally 01/25/2014)   COVID-19 Vaccine (1) 11/21/2022 (Originally 10/23/1971)   Zoster Vaccines- Shingrix (1 of 2) 02/04/2023 (Originally 04/25/2019)   FOOT EXAM  12/15/2022   MAMMOGRAM  02/01/2023   Diabetic kidney evaluation - eGFR measurement  02/14/2023   INFLUENZA VACCINE  02/20/2023   HEMOGLOBIN A1C  04/04/2023   PAP SMEAR-Modifier  09/29/2023  Diabetic kidney evaluation - Urine ACR  10/21/2023   Medicare Annual Wellness (AWV)  11/05/2023   COLONOSCOPY (Pts 45-15yrs Insurance coverage will need to be confirmed)  11/19/2029   Hepatitis C Screening  Completed   HIV Screening  Completed   HPV VACCINES  Aged Out    Health Maintenance  Health Maintenance Due  Topic Date Due   DTaP/Tdap/Td (2 - Tdap) 12/24/2013    Colorectal cancer screening: Type of screening: Colonoscopy. Completed 11/20/19. Repeat every 10 years  Mammogram status: Completed 01/31/21. Repeat every year    Lung Cancer Screening: (Low Dose CT Chest recommended if Age 39-80 years, 30 pack-year currently smoking OR have quit w/in 15years.) does not qualify.     Additional Screening:  Hepatitis C Screening: does qualify; Completed 08/17/21  Vision Screening: Recommended annual ophthalmology exams for early detection of glaucoma and other disorders of the eye. Is the patient up to date with their annual eye exam?   No Who is the provider or what is the name of the office in which the patient attends annual eye exams? [facility] If pt is not established with a provider, would they like to be referred to a provider to establish care? No .   Dental Screening: Recommended annual dental exams for proper oral hygiene  Community Resource Referral / Chronic Care Management:  CRR required this visit?  No   CCM required this visit?  No      Plan:     I have personally reviewed and noted the following in the patient's chart:   Medical and social history Use of alcohol, tobacco or illicit drugs  Current medications and supplements including opioid prescriptions. Patient is not currently taking opioid prescriptions. Functional ability and status Nutritional status Physical activity Advanced directives List of other physicians Hospitalizations, surgeries, and ER visits in previous 12 months Vitals Screenings to include cognitive, depression, and falls Referrals and appointments  In addition, I have reviewed and discussed with patient certain preventive protocols, quality metrics, and best practice recommendations. A written personalized care plan for preventive services as well as general preventive health recommendations were provided to patient.     Cathy Rung, LPN   1/61/0960   Nurse Notes: Patient request f/u with advised medication for insomnia.

## 2022-11-05 NOTE — Patient Instructions (Addendum)
Cathy Bond , Thank you for taking time to come for your Medicare Wellness Visit. I appreciate your ongoing commitment to your health goals. Please review the following plan we discussed and let me know if I can assist you in the future.   These are the goals we discussed:  Goals       HEMOGLOBIN A1C < 7      10.8 on 08/03/2021      No current goals (pt-stated)      Reduce symptoms of grief/depression      Care Coordination Interventions: Pt loss of son in May- causing grief/depression Pt shares she lives with her fiance and has 3 daughters and 1 living son who are nearby and "keep in touch" Pt reports she has lost 60 LBS in 3 weeks- "due to stress of losing my son"- will alert RNCM and PCP to this Motivational Interviewing employed Depression screen reviewed  PHQ2/ PHQ9 completed Solution-Focused Strategies employed:  Mindfulness or Relaxation training provided Active listening / Reflection utilized  Emotional Support Provided Problem Solving /Task Center strategies reviewed Participation in counseling encouraged : pt is open to bereavement counseling through [facility]- will send material to pt to review and consider for follow up           This is a list of the screening recommended for you and due dates:  Health Maintenance  Topic Date Due   DTaP/Tdap/Td vaccine (2 - Tdap) 12/24/2013   Eye exam for diabetics  11/05/2022*   COVID-19 Vaccine (1) 11/21/2022*   Zoster (Shingles) Vaccine (1 of 2) 02/04/2023*   Complete foot exam   12/15/2022   Mammogram  02/01/2023   Yearly kidney function blood test for diabetes  02/14/2023   Flu Shot  02/20/2023   Hemoglobin A1C  04/04/2023   Pap Smear  09/29/2023   Yearly kidney health urinalysis for diabetes  10/21/2023   Medicare Annual Wellness Visit  11/05/2023   Colon Cancer Screening  11/19/2029   Hepatitis C Screening: USPSTF Recommendation to screen - Ages 18-79 yo.  Completed   HIV Screening  Completed   HPV Vaccine  Aged Out   *Topic was postponed. The date shown is not the original due date.    Advanced directives: Advance directive discussed with you today. Even though you declined this today, please call our office should you change your mind, and we can give you the proper paperwork for you to fill out.   Conditions/risks identified: None  Next appointment: Follow up in one year for your annual wellness visit.   Preventive Care 40-64 Years, Female Preventive care refers to lifestyle choices and visits with your health care provider that can promote health and wellness. What does preventive care include? A yearly physical exam. This is also called an annual well check. Dental exams once or twice a year. Routine eye exams. Ask your health care provider how often you should have your eyes checked. Personal lifestyle choices, including: Daily care of your teeth and gums. Regular physical activity. Eating a healthy diet. Avoiding tobacco and drug use. Limiting alcohol use. Practicing safe sex. Taking low-dose aspirin daily starting at age 63. Taking vitamin and mineral supplements as recommended by your health care provider. What happens during an annual well check? The services and screenings done by your health care provider during your annual well check will depend on your age, overall health, lifestyle risk factors, and family history of disease. Counseling  Your health care provider may ask you questions about  your: Alcohol use. Tobacco use. Drug use. Emotional well-being. Home and relationship well-being. Sexual activity. Eating habits. Work and work Astronomer. Method of birth control. Menstrual cycle. Pregnancy history. Screening  You may have the following tests or measurements: Height, weight, and BMI. Blood pressure. Lipid and cholesterol levels. These may be checked every 5 years, or more frequently if you are over 48 years old. Skin check. Lung cancer screening. You may have this  screening every year starting at age 41 if you have a 30-pack-year history of smoking and currently smoke or have quit within the past 15 years. Fecal occult blood test (FOBT) of the stool. You may have this test every year starting at age 29. Flexible sigmoidoscopy or colonoscopy. You may have a sigmoidoscopy every 5 years or a colonoscopy every 10 years starting at age 70. Hepatitis C blood test. Hepatitis B blood test. Sexually transmitted disease (STD) testing. Diabetes screening. This is done by checking your blood sugar (glucose) after you have not eaten for a while (fasting). You may have this done every 1-3 years. Mammogram. This may be done every 1-2 years. Talk to your health care provider about when you should start having regular mammograms. This may depend on whether you have a family history of breast cancer. BRCA-related cancer screening. This may be done if you have a family history of breast, ovarian, tubal, or peritoneal cancers. Pelvic exam and Pap test. This may be done every 3 years starting at age 99. Starting at age 70, this may be done every 5 years if you have a Pap test in combination with an HPV test. Bone density scan. This is done to screen for osteoporosis. You may have this scan if you are at high risk for osteoporosis. Discuss your test results, treatment options, and if necessary, the need for more tests with your health care provider. Vaccines  Your health care provider may recommend certain vaccines, such as: Influenza vaccine. This is recommended every year. Tetanus, diphtheria, and acellular pertussis (Tdap, Td) vaccine. You may need a Td booster every 10 years. Zoster vaccine. You may need this after age 82. Pneumococcal 13-valent conjugate (PCV13) vaccine. You may need this if you have certain conditions and were not previously vaccinated. Pneumococcal polysaccharide (PPSV23) vaccine. You may need one or two doses if you smoke cigarettes or if you have certain  conditions. Talk to your health care provider about which screenings and vaccines you need and how often you need them. This information is not intended to replace advice given to you by your health care provider. Make sure you discuss any questions you have with your health care provider. Document Released: 08/04/2015 Document Revised: 03/27/2016 Document Reviewed: 05/09/2015 Elsevier Interactive Patient Education  2017 [employer].    Fall Prevention in the Home Falls can cause injuries. They can happen to people of all ages. There are many things you can do to make your home safe and to help prevent falls. What can I do on the outside of my home? Regularly fix the edges of walkways and driveways and fix any cracks. Remove anything that might make you trip as you walk through a door, such as a raised step or threshold. Trim any bushes or trees on the path to your home. Use bright outdoor lighting. Clear any walking paths of anything that might make someone trip, such as rocks or tools. Regularly check to see if handrails are loose or broken. Make sure that both sides of any steps have  handrails. Any raised decks and porches should have guardrails on the edges. Have any leaves, snow, or ice cleared regularly. Use sand or salt on walking paths during winter. Clean up any spills in your garage right away. This includes oil or grease spills. What can I do in the bathroom? Use night lights. Install grab bars by the toilet and in the tub and shower. Do not use towel bars as grab bars. Use non-skid mats or decals in the tub or shower. If you need to sit down in the shower, use a plastic, non-slip stool. Keep the floor dry. Clean up any water that spills on the floor as soon as it happens. Remove soap buildup in the tub or shower regularly. Attach bath mats securely with double-sided non-slip rug tape. Do not have throw rugs and other things on the floor that can make you trip. What can I  do in the bedroom? Use night lights. Make sure that you have a light by your bed that is easy to reach. Do not use any sheets or blankets that are too big for your bed. They should not hang down onto the floor. Have a firm chair that has side arms. You can use this for support while you get dressed. Do not have throw rugs and other things on the floor that can make you trip. What can I do in the kitchen? Clean up any spills right away. Avoid walking on wet floors. Keep items that you use a lot in easy-to-reach places. If you need to reach something above you, use a strong step stool that has a grab bar. Keep electrical cords out of the way. Do not use floor polish or wax that makes floors slippery. If you must use wax, use non-skid floor wax. Do not have throw rugs and other things on the floor that can make you trip. What can I do with my stairs? Do not leave any items on the stairs. Make sure that there are handrails on both sides of the stairs and use them. Fix handrails that are broken or loose. Make sure that handrails are as long as the stairways. Check any carpeting to make sure that it is firmly attached to the stairs. Fix any carpet that is loose or worn. Avoid having throw rugs at the top or bottom of the stairs. If you do have throw rugs, attach them to the floor with carpet tape. Make sure that you have a light switch at the top of the stairs and the bottom of the stairs. If you do not have them, ask someone to add them for you. What else can I do to help prevent falls? Wear shoes that: Do not have high heels. Have rubber bottoms. Are comfortable and fit you well. Are closed at the toe. Do not wear sandals. If you use a stepladder: Make sure that it is fully opened. Do not climb a closed stepladder. Make sure that both sides of the stepladder are locked into place. Ask someone to hold it for you, if possible. Clearly mark and make sure that you can see: Any grab bars or  handrails. First and last steps. Where the edge of each step is. Use tools that help you move around (mobility aids) if they are needed. These include: Canes. Walkers. Scooters. Crutches. Turn on the lights when you go into a dark area. Replace any light bulbs as soon as they burn out. Set up your furniture so you have a clear path. Avoid  moving your furniture around. If any of your floors are uneven, fix them. If there are any pets around you, be aware of where they are. Review your medicines with your doctor. Some medicines can make you feel dizzy. This can increase your chance of falling. Ask your doctor what other things that you can do to help prevent falls. This information is not intended to replace advice given to you by your health care provider. Make sure you discuss any questions you have with your health care provider. Document Released: 05/04/2009 Document Revised: 12/14/2015 Document Reviewed: 08/12/2014 Elsevier Interactive Patient Education  2017 [employer].

## 2022-11-12 ENCOUNTER — Encounter: Payer: Self-pay | Admitting: Student

## 2022-11-12 ENCOUNTER — Other Ambulatory Visit (HOSPITAL_COMMUNITY)
Admission: RE | Admit: 2022-11-12 | Discharge: 2022-11-12 | Disposition: A | Discharge status: Home / Self Care | Payer: Medicare HMO | Source: Ambulatory Visit | Attending: Family Medicine | Admitting: Family Medicine

## 2022-11-12 ENCOUNTER — Ambulatory Visit: Payer: Medicare HMO | Admitting: Student

## 2022-11-12 VITALS — BP 100/62 | HR 71 | Ht 64.0 in | Wt 176.6 lb

## 2022-11-12 DIAGNOSIS — Z1151 Encounter for screening for human papillomavirus (HPV): Secondary | ICD-10-CM | POA: Insufficient documentation

## 2022-11-12 DIAGNOSIS — Z124 Encounter for screening for malignant neoplasm of cervix: Secondary | ICD-10-CM | POA: Diagnosis not present

## 2022-11-12 DIAGNOSIS — Z01419 Encounter for gynecological examination (general) (routine) without abnormal findings: Secondary | ICD-10-CM | POA: Diagnosis not present

## 2022-11-12 DIAGNOSIS — I10 Essential (primary) hypertension: Secondary | ICD-10-CM

## 2022-11-12 DIAGNOSIS — E119 Type 2 diabetes mellitus without complications: Secondary | ICD-10-CM | POA: Diagnosis not present

## 2022-11-12 DIAGNOSIS — R87610 Atypical squamous cells of undetermined significance on cytologic smear of cervix (ASC-US): Secondary | ICD-10-CM

## 2022-11-12 MED ORDER — MOUNJARO 5 MG/0.5ML ~~LOC~~ SOAJ: 10.0000 mg | SUBCUTANEOUS | 1 refills | Status: DC

## 2022-11-12 NOTE — Progress Notes (Unsigned)
    SUBJECTIVE:   CHIEF COMPLAINT / HPI:   Pap Smear Here for Pap. *** STI testing.   PERTINENT  PMH / PSH: ***  OBJECTIVE:   There were no vitals taken for this visit.  ***  ASSESSMENT/PLAN:   No problem-specific Assessment & Plan notes found for this encounter.     Eliezer Mccoy, MD [facility]

## 2022-11-12 NOTE — Patient Instructions (Addendum)
Dewaine Oats to see you, everything on your internal vaginal exam looks normal.  We will send off your Pap for testing to be followed up on.  I will call you if there is anything we need to act on. For your blood pressure, I like you to continue taking your losartan as you have been. For your diabetes, it sounds like we can get the 5 mg dose of Mounjaro, I want you to take 2 shots weekly.  You can either do these both at the same time or do, for example, 1 on Thursday and 1 on Monday.  Eliezer Mccoy, MD

## 2022-11-13 NOTE — Assessment & Plan Note (Signed)
Difficulty obtaining 7.5mg  dose. Patient is now amenable to a dose increase which is quite appropriate given her elevated A1c. Given that the  dose is also frequently backordered, will try starting her on Mounjaro  weekly using the  pens. These can either be administered together once weekly or she could dose  twice weekly. She is amenable to this plan.

## 2022-11-13 NOTE — Assessment & Plan Note (Addendum)
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 Presents for repeat Pap today for one year follow-up. Visual exam of the cervix unremarkable. - Pap with HPV co-testing - GC/Chlamydia/Trichomonas

## 2022-11-13 NOTE — Assessment & Plan Note (Signed)
BP is low normal today, but on review of records since starting the current dose, BP has been running 100s-130s/60s-70s. Given she has had no symptoms suggestive of hypotension, will continue current management.

## 2022-11-15 DIAGNOSIS — Z7985 Long-term (current) use of injectable non-insulin antidiabetic drugs: Secondary | ICD-10-CM | POA: Diagnosis not present

## 2022-11-15 DIAGNOSIS — Z7984 Long term (current) use of oral hypoglycemic drugs: Secondary | ICD-10-CM | POA: Diagnosis not present

## 2022-11-15 DIAGNOSIS — E1165 Type 2 diabetes mellitus with hyperglycemia: Secondary | ICD-10-CM | POA: Diagnosis not present

## 2022-11-15 DIAGNOSIS — R197 Diarrhea, unspecified: Secondary | ICD-10-CM | POA: Diagnosis not present

## 2022-11-15 DIAGNOSIS — R109 Unspecified abdominal pain: Secondary | ICD-10-CM | POA: Diagnosis not present

## 2022-11-15 DIAGNOSIS — R112 Nausea with vomiting, unspecified: Secondary | ICD-10-CM | POA: Diagnosis not present

## 2022-11-15 LAB — CYTOLOGY - PAP
Chlamydia: NEGATIVE
Comment: NEGATIVE
Comment: NEGATIVE
Comment: NEGATIVE
Comment: NORMAL
Diagnosis: NEGATIVE
Diagnosis: REACTIVE
High risk HPV: NEGATIVE
Neisseria Gonorrhea: NEGATIVE
Trichomonas: NEGATIVE

## 2022-11-16 DIAGNOSIS — I517 Cardiomegaly: Secondary | ICD-10-CM | POA: Diagnosis not present

## 2022-11-18 ENCOUNTER — Other Ambulatory Visit: Payer: Self-pay

## 2022-11-18 ENCOUNTER — Encounter (HOSPITAL_COMMUNITY): Payer: Self-pay

## 2022-11-18 ENCOUNTER — Inpatient Hospital Stay (HOSPITAL_COMMUNITY)
Admission: EM | Admit: 2022-11-18 | Discharge: 2022-11-21 | DRG: 638 | Disposition: A | Discharge status: Home / Self Care | Payer: Medicare HMO | Attending: Internal Medicine | Admitting: Internal Medicine

## 2022-11-18 DIAGNOSIS — Z811 Family history of alcohol abuse and dependence: Secondary | ICD-10-CM

## 2022-11-18 DIAGNOSIS — R112 Nausea with vomiting, unspecified: Secondary | ICD-10-CM

## 2022-11-18 DIAGNOSIS — Z7984 Long term (current) use of oral hypoglycemic drugs: Secondary | ICD-10-CM | POA: Diagnosis not present

## 2022-11-18 DIAGNOSIS — Z79899 Other long term (current) drug therapy: Secondary | ICD-10-CM

## 2022-11-18 DIAGNOSIS — E876 Hypokalemia: Secondary | ICD-10-CM | POA: Diagnosis not present

## 2022-11-18 DIAGNOSIS — E1010 Type 1 diabetes mellitus with ketoacidosis without coma: Secondary | ICD-10-CM | POA: Diagnosis not present

## 2022-11-18 DIAGNOSIS — Z823 Family history of stroke: Secondary | ICD-10-CM

## 2022-11-18 DIAGNOSIS — I951 Orthostatic hypotension: Secondary | ICD-10-CM | POA: Diagnosis not present

## 2022-11-18 DIAGNOSIS — E1110 Type 2 diabetes mellitus with ketoacidosis without coma: Secondary | ICD-10-CM | POA: Diagnosis not present

## 2022-11-18 DIAGNOSIS — Z888 Allergy status to other drugs, medicaments and biological substances status: Secondary | ICD-10-CM

## 2022-11-18 DIAGNOSIS — N179 Acute kidney failure, unspecified: Secondary | ICD-10-CM | POA: Diagnosis not present

## 2022-11-18 DIAGNOSIS — Z833 Family history of diabetes mellitus: Secondary | ICD-10-CM

## 2022-11-18 DIAGNOSIS — R000 Tachycardia, unspecified: Secondary | ICD-10-CM | POA: Diagnosis not present

## 2022-11-18 DIAGNOSIS — Z7985 Long-term (current) use of injectable non-insulin antidiabetic drugs: Secondary | ICD-10-CM | POA: Diagnosis not present

## 2022-11-18 DIAGNOSIS — I151 Hypertension secondary to other renal disorders: Secondary | ICD-10-CM | POA: Diagnosis not present

## 2022-11-18 DIAGNOSIS — E860 Dehydration: Secondary | ICD-10-CM | POA: Diagnosis present

## 2022-11-18 DIAGNOSIS — Z882 Allergy status to sulfonamides status: Secondary | ICD-10-CM | POA: Diagnosis not present

## 2022-11-18 DIAGNOSIS — Z91018 Allergy to other foods: Secondary | ICD-10-CM

## 2022-11-18 DIAGNOSIS — I10 Essential (primary) hypertension: Secondary | ICD-10-CM | POA: Diagnosis not present

## 2022-11-18 DIAGNOSIS — Z818 Family history of other mental and behavioral disorders: Secondary | ICD-10-CM

## 2022-11-18 DIAGNOSIS — E119 Type 2 diabetes mellitus without complications: Secondary | ICD-10-CM | POA: Diagnosis present

## 2022-11-18 DIAGNOSIS — Z87891 Personal history of nicotine dependence: Secondary | ICD-10-CM

## 2022-11-18 LAB — BLOOD GAS, VENOUS
Acid-base deficit: 23.1 mmol/L — ABNORMAL HIGH (ref 0.0–2.0)
Bicarbonate: 6.3 mmol/L — ABNORMAL LOW (ref 20.0–28.0)
O2 Saturation: 67.9 %
Patient temperature: 37
pCO2, Ven: 24 mmHg — ABNORMAL LOW (ref 44–60)
pH, Ven: 7.03 — CL (ref 7.25–7.43)
pO2, Ven: 36 mmHg (ref 32–45)

## 2022-11-18 LAB — COMPREHENSIVE METABOLIC PANEL
ALT: 70 U/L — ABNORMAL HIGH (ref 0–44)
AST: 36 U/L (ref 15–41)
Albumin: 4 g/dL (ref 3.5–5.0)
Alkaline Phosphatase: 71 U/L (ref 38–126)
BUN: 29 mg/dL — ABNORMAL HIGH (ref 6–20)
CO2: <7 mmol/L — ABNORMAL LOW (ref 22–32)
Calcium: 8.9 mg/dL (ref 8.9–10.3)
Chloride: 102 mmol/L (ref 98–111)
Creatinine, Ser: 1.32 mg/dL — ABNORMAL HIGH (ref 0.44–1.00)
GFR, Estimated: 48 mL/min — ABNORMAL LOW (ref >60)
Glucose, Bld: 417 mg/dL — ABNORMAL HIGH (ref 70–99)
Potassium: 4.9 mmol/L (ref 3.5–5.1)
Sodium: 131 mmol/L — ABNORMAL LOW (ref 135–145)
Total Bilirubin: 1.4 mg/dL — ABNORMAL HIGH (ref 0.3–1.2)
Total Protein: 8.1 g/dL (ref 6.5–8.1)

## 2022-11-18 LAB — CBG MONITORING, ED
Glucose-Capillary: 241 mg/dL — ABNORMAL HIGH (ref 70–99)
Glucose-Capillary: 276 mg/dL — ABNORMAL HIGH (ref 70–99)
Glucose-Capillary: 338 mg/dL — ABNORMAL HIGH (ref 70–99)
Glucose-Capillary: 412 mg/dL — ABNORMAL HIGH (ref 70–99)
Glucose-Capillary: 434 mg/dL — ABNORMAL HIGH (ref 70–99)

## 2022-11-18 LAB — BASIC METABOLIC PANEL
Anion gap: 16 — ABNORMAL HIGH (ref 5–15)
BUN: 26 mg/dL — ABNORMAL HIGH (ref 6–20)
CO2: 8 mmol/L — ABNORMAL LOW (ref 22–32)
Calcium: 7.9 mg/dL — ABNORMAL LOW (ref 8.9–10.3)
Chloride: 110 mmol/L (ref 98–111)
Creatinine, Ser: 1.33 mg/dL — ABNORMAL HIGH (ref 0.44–1.00)
GFR, Estimated: 48 mL/min — ABNORMAL LOW (ref >60)
Glucose, Bld: 384 mg/dL — ABNORMAL HIGH (ref 70–99)
Potassium: 5 mmol/L (ref 3.5–5.1)
Sodium: 134 mmol/L — ABNORMAL LOW (ref 135–145)

## 2022-11-18 LAB — CBC WITH DIFFERENTIAL/PLATELET
Abs Immature Granulocytes: 0.18 10*3/uL — ABNORMAL HIGH (ref 0.00–0.07)
Basophils Absolute: 0 10*3/uL (ref 0.0–0.1)
Basophils Relative: 0 %
Eosinophils Absolute: 0 10*3/uL (ref 0.0–0.5)
Eosinophils Relative: 0 %
HCT: 41.3 % (ref 36.0–46.0)
Hemoglobin: 13.9 g/dL (ref 12.0–15.0)
Immature Granulocytes: 2 %
Lymphocytes Relative: 14 %
Lymphs Abs: 1.1 10*3/uL (ref 0.7–4.0)
MCH: 28.2 pg (ref 26.0–34.0)
MCHC: 33.7 g/dL (ref 30.0–36.0)
MCV: 83.8 fL (ref 80.0–100.0)
Monocytes Absolute: 0.5 10*3/uL (ref 0.1–1.0)
Monocytes Relative: 6 %
Neutro Abs: 6.2 10*3/uL (ref 1.7–7.7)
Neutrophils Relative %: 78 %
Platelets: 297 10*3/uL (ref 150–400)
RBC: 4.93 MIL/uL (ref 3.87–5.11)
RDW: 13.7 % (ref 11.5–15.5)
WBC: 8 10*3/uL (ref 4.0–10.5)
nRBC: 0 % (ref 0.0–0.2)

## 2022-11-18 LAB — URINALYSIS, ROUTINE W REFLEX MICROSCOPIC
Bilirubin Urine: NEGATIVE
Glucose, UA: >=500 mg/dL — AB
Ketones, ur: 80 mg/dL — AB
Leukocytes,Ua: NEGATIVE
Nitrite: NEGATIVE
Protein, ur: 30 mg/dL — AB
Specific Gravity, Urine: 1.016 (ref 1.005–1.030)
pH: 5 (ref 5.0–8.0)

## 2022-11-18 LAB — BETA-HYDROXYBUTYRIC ACID: Beta-Hydroxybutyric Acid: 7.31 mmol/L — ABNORMAL HIGH (ref 0.05–0.27)

## 2022-11-18 LAB — RAPID URINE DRUG SCREEN, HOSP PERFORMED
Amphetamines: NOT DETECTED
Barbiturates: NOT DETECTED
Benzodiazepines: NOT DETECTED
Cocaine: NOT DETECTED
Opiates: NOT DETECTED
Tetrahydrocannabinol: NOT DETECTED

## 2022-11-18 LAB — LIPASE, BLOOD: Lipase: 89 U/L — ABNORMAL HIGH (ref 11–51)

## 2022-11-18 LAB — I-STAT BETA HCG BLOOD, ED (MC, WL, AP ONLY): I-stat hCG, quantitative: <5 m[IU]/mL (ref <5)

## 2022-11-18 MED ORDER — SODIUM CHLORIDE 0.9 % IV BOLUS
2000.0000 mL | Freq: Once | INTRAVENOUS | Status: AC
  Administered 2022-11-18: 2000 mL via INTRAVENOUS

## 2022-11-18 MED ORDER — POTASSIUM CHLORIDE 10 MEQ/100ML IV SOLN
10.0000 meq | INTRAVENOUS | Status: AC
  Administered 2022-11-18 (×2): 10 meq via INTRAVENOUS
  Filled 2022-11-18 (×2): qty 100

## 2022-11-18 MED ORDER — ENOXAPARIN SODIUM 40 MG/0.4ML IJ SOSY
40.0000 mg | PREFILLED_SYRINGE | INTRAMUSCULAR | Status: DC
  Administered 2022-11-19 – 2022-11-20 (×2): 40 mg via SUBCUTANEOUS
  Filled 2022-11-18 (×2): qty 0.4

## 2022-11-18 MED ORDER — SODIUM CHLORIDE 0.9 % IV BOLUS: 1000.0000 mL | Freq: Once | INTRAVENOUS | Status: DC

## 2022-11-18 MED ORDER — DEXTROSE IN LACTATED RINGERS 5 % IV SOLN: INTRAVENOUS | Status: DC

## 2022-11-18 MED ORDER — PROCHLORPERAZINE EDISYLATE 10 MG/2ML IJ SOLN
10.0000 mg | Freq: Once | INTRAMUSCULAR | Status: AC
  Administered 2022-11-18: 10 mg via INTRAVENOUS
  Filled 2022-11-18: qty 2

## 2022-11-18 MED ORDER — DEXTROSE 50 % IV SOLN: 0.0000 mL | INTRAVENOUS | Status: DC | PRN

## 2022-11-18 MED ORDER — LACTATED RINGERS IV BOLUS
1000.0000 mL | Freq: Once | INTRAVENOUS | Status: AC
  Administered 2022-11-18: 1000 mL via INTRAVENOUS

## 2022-11-18 MED ORDER — INSULIN REGULAR(HUMAN) IN NACL 100-0.9 UT/100ML-% IV SOLN
INTRAVENOUS | Status: DC
  Administered 2022-11-18: 12 [IU]/h via INTRAVENOUS
  Filled 2022-11-18: qty 100

## 2022-11-18 MED ORDER — LACTATED RINGERS IV SOLN: INTRAVENOUS | Status: DC

## 2022-11-18 MED ORDER — DIPHENHYDRAMINE HCL 50 MG/ML IJ SOLN
12.5000 mg | Freq: Once | INTRAMUSCULAR | Status: AC
  Administered 2022-11-18: 12.5 mg via INTRAVENOUS
  Filled 2022-11-18: qty 1

## 2022-11-18 NOTE — Assessment & Plan Note (Signed)
>>  ASSESSMENT AND PLAN FOR DKA (DIABETIC KETOACIDOSIS) (HCC) WRITTEN ON 11/18/2022  9:23 PM BY GARDNER, JARED M, DO  H/o DM2, first time in DKA. Stop non-insulin hypoglycemics DKA pathway Insulin gtt per pathway IVF bolus and maint per pathway BMP Q4H BHB pending Cause of DKA not clear: UA pending Check C-Peptide level Maybe gastroenteritis? Looks like she was having N/V on 4/26, seen in [facility] ED, bicarb 19 and AG 14 then, CTAP was unremarkable, so looks like DKA might have come after the NV?

## 2022-11-18 NOTE — ED Notes (Signed)
Reports she has been drinking pedialyte.  Patient reports her father has to help her to the bathroom.  Will get a bedside commode for patient.

## 2022-11-18 NOTE — Assessment & Plan Note (Addendum)
H/o DM2, first time in DKA. Stop non-insulin hypoglycemics DKA pathway Insulin gtt per pathway IVF bolus and maint per pathway BMP Q4H BHB pending Cause of DKA not clear: UA pending Check C-Peptide level Maybe gastroenteritis? Looks like she was having N/V on 4/26, seen in [facility] ED, bicarb 19 and AG 14 then, CTAP was unremarkable, so looks like DKA might have come after the NV?

## 2022-11-18 NOTE — ED Triage Notes (Signed)
Pt arrives after being seen 4/26 at [facility] for emesis and abd pain. Pt states that since being d/c from there, she has continued to have emesis. Pt is fatigued and cannot keep down food at home. Pt states she feels dehydrated and worn down. Pt c/o epigastric pain.

## 2022-11-18 NOTE — Assessment & Plan Note (Signed)
Hold Losartan given mild AKI.

## 2022-11-18 NOTE — H&P (Signed)
History and Physical    Patient: Cathy Bond:[id number] DOB: August 14, 1970 DOA: 11/18/2022 DOS: the patient was seen and examined on 11/18/2022 PCP: [name], MD  Patient coming from: Home  Chief Complaint:  Chief Complaint  Patient presents with   Emesis   HPI: Cathy Bond is a 52 y.o. female with medical history significant of DM2, HTN.  Never had DKA before.  Pt with several day h/o N/V.  Seen at [facility] ED on 4/26.  Work up largely unremarkable there, thought pt had gastroenteritis.  Symptoms persisted.  Now feeling very dehydrated.  In to ED here at [facility].  Review of Systems: As mentioned in the history of present illness. All other systems reviewed and are negative. Past Medical History:  Diagnosis Date   Anxiety    BV (bacterial vaginosis)    Depression    Diabetes mellitus    Hypertension    Seasonal allergies    Trichomonas    Yeast vaginitis    Past Surgical History:  Procedure Laterality Date   endometrial biopsy  2 in 2013   negative   toe nail     TUBAL LIGATION     Social History:  reports that she quit smoking about 8 years ago. Her smoking use included cigarettes. She has a 6.00 pack-year smoking history. She has never used smokeless tobacco. She reports that she does not drink alcohol and does not use drugs.  Allergies  Allergen Reactions   Apple Juice Hives and Itching    Patient allergic to applesauce, but not raw apples   Shellfish Allergy Hives, Itching and Swelling    Denies airway involvement   Wellbutrin [Bupropion Hcl] Nausea Only    Significant   Metformin And Related Other (See Comments)    Itching, Dry Skin, patient preference to stop as this happened with only 500mg  daily.     Family History  Problem Relation Age of Onset   Diabetes Mother    Stroke Mother    Cancer Father    Depression Sister    Alcohol abuse Sister    Diabetes Sister    Depression Brother    Diabetes Brother     Prior to Admission medications    Medication Sig Start Date End Date Taking? Authorizing Provider  cetirizine (ZYRTEC) 10 MG tablet TAKE 1 TABLET(10 MG) BY MOUTH DAILY 10/21/22   [name], MD  diclofenac Sodium (VOLTAREN) 1 % GEL Apply 2 g topically 4 (four) times daily. Patient not taking: Reported on 10/02/2022 06/05/22   [name], MD  EPINEPHrine 0.3 mg/0.3 mL IJ SOAJ injection Inject 0.3 mg into the muscle as needed for anaphylaxis. 06/05/22   [name], MD  glucose blood (FREESTYLE PRECISION NEO TEST) test strip Use as instructed to test blood glucose twice daily 12/14/21   Simmons-Robinson, Tawanna Cooler, MD  JARDIANCE 25 MG TABS tablet TAKE 1 TABLET BY MOUTH DAILY 04/08/22   [name], MD  Lancets (FREESTYLE) lancets Use as instructed to test blood glucose twice daily 09/03/21   Moses Manners, MD  losartan (COZAAR) 50 MG tablet TAKE 1 TABLET(50 MG) BY MOUTH DAILY 05/09/22   [name], MD  mometasone (NASONEX) 50 MCG/ACT nasal spray Place 2 sprays into the nose daily. 09/24/21   Moses Manners, MD  Multiple Vitamin (MULITIVITAMIN WITH MINERALS) TABS Take 1 tablet by mouth daily.    [provider]  Multiple Vitamins-Minerals (HAIR/SKIN/NAILS PO) Take 2 tablets by mouth 2 (  two) times daily.    [provider]  nitroGLYCERIN (NITROSTAT) 0.3 MG SL tablet Place 1 tablet (0.3 mg total) under the tongue every 5 (five) minutes as needed for chest pain. Patient not taking: Reported on 10/02/2022 09/27/19   Moses Manners, MD  polyethylene glycol powder (GLYCOLAX/MIRALAX) 17 GM/SCOOP powder Take 17 g by mouth daily. 09/26/22   Maury Dus, MD  rosuvastatin (CRESTOR) 20 MG tablet TAKE 1 TABLET(20 MG) BY MOUTH DAILY 10/30/22   [name], MD  tirzepatide [facility]) 5 MG/0.5ML Pen Inject 10 mg into the skin once a week. 11/12/22   [name], MD    Physical Exam: Vitals:   11/18/22 1737 11/18/22 1809 11/18/22 1830 11/18/22 2123  BP: 125/78  123/81   Pulse: (!) 114  98    Resp: 18  18   Temp: 98.7 F (37.1 C)   98.9 F (37.2 C)  TempSrc: Oral   Oral  SpO2: 99%  100%   Weight:  79.8 kg    Height:  5\' 4"  (1.626 m)     Constitutional: Tachypnic Respiratory: CTA B, tachypnic Cardiovascular: Tachycardic Abdomen: no tenderness, no masses palpated. No hepatosplenomegaly. Bowel sounds positive.  Neurologic: CN 2-12 grossly intact. Sensation intact, DTR normal. Strength 5/5 in all 4.  Psychiatric: Normal judgment and insight. Alert and oriented x 3. Normal mood.   Data Reviewed:       Latest Ref Rng & Units 11/18/2022    6:19 PM 02/13/2022    3:28 PM 08/01/2021    9:15 PM  CBC  WBC 4.0 - 10.5 K/uL 8.0  5.5  5.9   Hemoglobin 12.0 - 15.0 g/dL 09.8  11.9  14.7   Hematocrit 36.0 - 46.0 % 41.3  36.8  39.5   Platelets 150 - 400 K/uL 297  231  264       Latest Ref Rng & Units 11/18/2022    9:26 PM 11/18/2022    6:19 PM 02/13/2022    3:28 PM  CMP  Glucose 70 - 99 mg/dL 829  562  130   BUN 6 - 20 mg/dL 26  29  15    Creatinine 0.44 - 1.00 mg/dL 8.65  7.84  6.96   Sodium 135 - 145 mmol/L 134  131  137   Potassium 3.5 - 5.1 mmol/L 5.0  4.9  3.4   Chloride 98 - 111 mmol/L 110  102  103   CO2 22 - 32 mmol/L 8  <7  26   Calcium 8.9 - 10.3 mg/dL 7.9  8.9  9.3   Total Protein 6.5 - 8.1 g/dL  8.1  7.8   Total Bilirubin 0.3 - 1.2 mg/dL  1.4  0.4   Alkaline Phos 38 - 126 U/L  71  78   AST 15 - 41 U/L  36  15   ALT 0 - 44 U/L  70  19    VBG: result: pCO2 24; pH 7.03;  HCO3 6.3, %O2 Sat 67.9.   Assessment and Plan: * DKA (diabetic ketoacidosis) (HCC) H/o DM2, first time in DKA. Stop non-insulin hypoglycemics DKA pathway Insulin gtt per pathway IVF bolus and maint per pathway BMP Q4H BHB pending Cause of DKA not clear: UA pending Check C-Peptide level Maybe gastroenteritis? Looks like she was having N/V on 4/26, seen in [facility] ED, bicarb 19 and AG 14 then, CTAP was unremarkable, so looks like DKA might have come after the NV?  HTN (hypertension) Hold  Losartan given  mild AKI.      Advance Care Planning:   Code Status: Full Code  Consults: None  Family Communication: Family at bedside  Severity of Illness: The appropriate patient status for this patient is INPATIENT. Inpatient status is judged to be reasonable and necessary in order to provide the required intensity of service to ensure the patient's safety. The patient's presenting symptoms, physical exam findings, and initial radiographic and laboratory data in the context of their chronic comorbidities is felt to place them at high risk for further clinical deterioration. Furthermore, it is not anticipated that the patient will be medically stable for discharge from the hospital within 2 midnights of admission.   * I certify that at the point of admission it is my clinical judgment that the patient will require inpatient hospital care spanning beyond 2 midnights from the point of admission due to high intensity of service, high risk for further deterioration and high frequency of surveillance required.*  Author: Hillary Bow., DO 11/18/2022 9:25 PM  For on call review [email or web address].

## 2022-11-18 NOTE — ED Notes (Signed)
Provider at bedside

## 2022-11-18 NOTE — ED Provider Notes (Addendum)
[employer] EMERGENCY DEPARTMENT AT [facility] Provider Note   CSN: [id number] Arrival date & time: 11/18/22  1727     History  Chief Complaint  Patient presents with   Emesis    Cathy Bond is a 52 y.o. female.  Patient here with hypertension, diabetes history with nausea and vomiting and upper abdominal pain.  She had workup a couple days ago that was [facility]able [facility].  She has been with improvement vomiting with her Zofran but she still feels very nauseous.  She is able to hold fluids down but still feels really dehydrated.  She is having some upper abdominal pain still but not as much currently.  She is concerned that she still dehydrated.  She denies any diarrhea.  No weakness numbness chills fevers chest pain or shortness of breath.  The history is provided by the patient.       Home Medications Prior to Admission medications   Medication Sig Start Date End Date Taking? Authorizing Provider  cetirizine (ZYRTEC) 10 MG tablet TAKE 1 TABLET(10 MG) BY MOUTH DAILY 10/21/22   [name], MD  diclofenac Sodium (VOLTAREN) 1 % GEL Apply 2 g topically 4 (four) times daily. Patient not taking: Reported on 10/02/2022 06/05/22   [name], MD  EPINEPHrine 0.3 mg/0.3 mL IJ SOAJ injection Inject 0.3 mg into the muscle as needed for anaphylaxis. 06/05/22   [name], MD  glucose blood (FREESTYLE PRECISION NEO TEST) test strip Use as instructed to test blood glucose twice daily 12/14/21   Simmons-Robinson, Tawanna Cooler, MD  JARDIANCE 25 MG TABS tablet TAKE 1 TABLET BY MOUTH DAILY 04/08/22   [name], MD  Lancets (FREESTYLE) lancets Use as instructed to test blood glucose twice daily 09/03/21   Moses Manners, MD  losartan (COZAAR) 50 MG tablet TAKE 1 TABLET(50 MG) BY MOUTH DAILY 05/09/22   [name], MD  mometasone (NASONEX) 50 MCG/ACT nasal spray Place 2 sprays into the nose daily. 09/24/21   Moses Manners, MD  Multiple Vitamin  (MULITIVITAMIN WITH MINERALS) TABS Take 1 tablet by mouth daily.    [provider]  Multiple Vitamins-Minerals (HAIR/SKIN/NAILS PO) Take 2 tablets by mouth 2 (two) times daily.    [provider]  nitroGLYCERIN (NITROSTAT) 0.3 MG SL tablet Place 1 tablet (0.3 mg total) under the tongue every 5 (five) minutes as needed for chest pain. Patient not taking: Reported on 10/02/2022 09/27/19   Moses Manners, MD  polyethylene glycol powder (GLYCOLAX/MIRALAX) 17 GM/SCOOP powder Take 17 g by mouth daily. 09/26/22   Maury Dus, MD  rosuvastatin (CRESTOR) 20 MG tablet TAKE 1 TABLET(20 MG) BY MOUTH DAILY 10/30/22   [name], MD  tirzepatide [facility]) 5 MG/0.5ML Pen Inject 10 mg into the skin once a week. 11/12/22   [name], MD      Allergies    Apple juice, Shellfish allergy, Wellbutrin [bupropion hcl], and Metformin and related    Review of Systems   Review of Systems  Physical Exam Updated Vital Signs BP 123/81   Pulse 98   Temp 98.7 F (37.1 C) (Oral)   Resp 18   Ht 5\' 4"  (1.626 m)   Wt 79.8 kg   SpO2 100%   BMI 30.21 kg/m  Physical Exam Vitals and nursing note reviewed.  Constitutional:      General: She is not in acute distress.    Appearance: She is well-developed. She is not  ill-appearing.  HENT:     Head: Normocephalic and atraumatic.     Mouth/Throat:     Mouth: Mucous membranes are dry.  Eyes:     Extraocular Movements: Extraocular movements intact.     Conjunctiva/sclera: Conjunctivae normal.     Pupils: Pupils are equal, round, and reactive to light.  Cardiovascular:     Rate and Rhythm: Normal rate and regular rhythm.     Pulses: Normal pulses.     Heart sounds: No murmur heard. Pulmonary:     Effort: Pulmonary effort is normal. No respiratory distress.     Breath sounds: Normal breath sounds.  Abdominal:     Palpations: Abdomen is soft.     Tenderness: There is no abdominal tenderness.  Musculoskeletal:        General: No  swelling.     Cervical back: Normal range of motion and neck supple.  Skin:    General: Skin is warm and dry.     Capillary Refill: Capillary refill takes less than 2 seconds.  Neurological:     Mental Status: She is alert.  Psychiatric:        Mood and Affect: Mood normal.     ED Results / Procedures / Treatments   Labs (all labs ordered are listed, but only abnormal results are displayed) Labs Reviewed  CBC WITH DIFFERENTIAL/PLATELET - Abnormal; Notable for the following components:      Result Value   Abs Immature Granulocytes 0.18 (*)    All other components within normal limits  COMPREHENSIVE METABOLIC PANEL - Abnormal; Notable for the following components:   Sodium 131 (*)    CO2 <7 (*)    Glucose, Bld 417 (*)    BUN 29 (*)    Creatinine, Ser 1.32 (*)    ALT 70 (*)    Total Bilirubin 1.4 (*)    GFR, Estimated 48 (*)    All other components within normal limits  LIPASE, BLOOD - Abnormal; Notable for the following components:   Lipase 89 (*)    All other components within normal limits  BLOOD GAS, VENOUS - Abnormal; Notable for the following components:   pH, Ven 7.03 (*)    pCO2, Ven 24 (*)    Bicarbonate 6.3 (*)    Acid-base deficit 23.1 (*)    All other components within normal limits  CBG MONITORING, ED - Abnormal; Notable for the following components:   Glucose-Capillary 434 (*)    All other components within normal limits  URINALYSIS, ROUTINE W REFLEX MICROSCOPIC  RAPID URINE DRUG SCREEN, HOSP PERFORMED  BETA-HYDROXYBUTYRIC ACID  BETA-HYDROXYBUTYRIC ACID  I-STAT BETA HCG BLOOD, ED ([facility], [facility], [facility] ONLY)    EKG EKG Interpretation  Date/Time:  Monday November 18 2022 18:37:06 EDT Ventricular Rate:  100 PR Interval:  138 QRS Duration: 101 QT Interval:  318 QTC Calculation: 411 R Axis:   91 Text Interpretation: Sinus tachycardia Borderline right axis deviation Confirmed by Lockie Mola, Cayne Yom ([id number]) on 11/18/2022 6:41:51 PM  Radiology No results  found.  Procedures .Critical Care  Performed by: Virgina Norfolk, DO Authorized by: Virgina Norfolk, DO   Critical care provider statement:    Critical care time (minutes):  35   Critical care was necessary to treat or prevent imminent or life-threatening deterioration of the following conditions:  Endocrine crisis   Critical care was time spent personally by me on the following activities:  Blood draw for specimens, development of treatment plan with patient or surrogate, discussions with primary  provider, evaluation of patient's response to treatment, examination of patient, obtaining history from patient or surrogate, ordering and performing treatments and interventions, ordering and review of laboratory studies, ordering and review of radiographic studies, pulse oximetry, review of old charts and re-evaluation of patient's condition   I assumed direction of critical care for this patient from another provider in my specialty: no     Care discussed with: admitting provider       Medications Ordered in ED Medications  lactated ringers bolus 1,000 mL (has no administration in time range)  insulin regular, human (MYXREDLIN) 100 units/ 100 mL infusion (has no administration in time range)  lactated ringers infusion (has no administration in time range)  dextrose 5 % in lactated ringers infusion (has no administration in time range)  dextrose 50 % solution 0-50 mL (has no administration in time range)  potassium chloride 10 mEq in 100 mL IVPB (has no administration in time range)  sodium chloride 0.9 % bolus 2,000 mL (2,000 mLs Intravenous New Bag/Given 11/18/22 1837)  prochlorperazine (COMPAZINE) injection 10 mg (10 mg Intravenous Given 11/18/22 1838)  diphenhydrAMINE (BENADRYL) injection 12.5 mg (12.5 mg Intravenous Given 11/18/22 1837)    ED Course/ Medical Decision Making/ A&P                             Medical Decision Making Amount and/or Complexity of Data Reviewed Labs:  ordered.  Risk Prescription drug management. Decision regarding hospitalization.   Cathy Bond is here with nausea and vomiting.  Normal vitals.  No fever.  History of diabetes.  Differential diagnosis likely ongoing viral process versus less likely pancreatitis or cholecystitis.  Could be gastroparesis.  She had CT scan a couple days ago that was unremarkable.  She was sent home on Zofran.  Overall we will look for electrolyte abnormality, kidney injury.  Will check labs and give IV fluids and IV Compazine and will reevaluate.  At this time I will hold on any further imaging as she had unremarkable imaging a couple days ago.  Per my review and interpretation the labs patient is in DKA.  pH 7.03, bicarb 6.3, anion gap is unable to be calculated.  Blood sugar is 417.  She has mild AKI with a creatinine of 1.32.  Overall suspect that is the cause of her symptoms today.  Seems like it probably started from a viral process.  Will add additional IV fluids, IV insulin per Endo tool protocol.  Will admit for further DKA care.  This chart was dictated using voice recognition software.  Despite best efforts to proofread,  errors can occur which can change the documentation meaning.         Final Clinical Impression(s) / ED Diagnoses Final diagnoses:  Diabetic ketoacidosis without coma associated with type 1 diabetes mellitus (HCC)  Nausea and vomiting, unspecified vomiting type    Rx / DC Orders ED Discharge Orders     None         Virgina Norfolk, DO 11/18/22 2041    Virgina Norfolk, DO 11/18/22 2042

## 2022-11-19 DIAGNOSIS — E1010 Type 1 diabetes mellitus with ketoacidosis without coma: Secondary | ICD-10-CM | POA: Diagnosis not present

## 2022-11-19 LAB — BASIC METABOLIC PANEL
Anion gap: 9 (ref 5–15)
Anion gap: 8 (ref 5–15)
Anion gap: 7 (ref 5–15)
Anion gap: 9 (ref 5–15)
BUN: 24 mg/dL — ABNORMAL HIGH (ref 6–20)
BUN: 21 mg/dL — ABNORMAL HIGH (ref 6–20)
BUN: 18 mg/dL (ref 6–20)
BUN: 16 mg/dL (ref 6–20)
CO2: 11 mmol/L — ABNORMAL LOW (ref 22–32)
CO2: 14 mmol/L — ABNORMAL LOW (ref 22–32)
CO2: 17 mmol/L — ABNORMAL LOW (ref 22–32)
CO2: 15 mmol/L — ABNORMAL LOW (ref 22–32)
Calcium: 8 mg/dL — ABNORMAL LOW (ref 8.9–10.3)
Calcium: 7.9 mg/dL — ABNORMAL LOW (ref 8.9–10.3)
Calcium: 8.5 mg/dL — ABNORMAL LOW (ref 8.9–10.3)
Calcium: 8.3 mg/dL — ABNORMAL LOW (ref 8.9–10.3)
Chloride: 110 mmol/L (ref 98–111)
Chloride: 109 mmol/L (ref 98–111)
Chloride: 111 mmol/L (ref 98–111)
Chloride: 110 mmol/L (ref 98–111)
Creatinine, Ser: 0.97 mg/dL (ref 0.44–1.00)
Creatinine, Ser: 0.86 mg/dL (ref 0.44–1.00)
Creatinine, Ser: 0.87 mg/dL (ref 0.44–1.00)
Creatinine, Ser: 0.85 mg/dL (ref 0.44–1.00)
GFR, Estimated: >60 mL/min (ref >60)
GFR, Estimated: >60 mL/min (ref >60)
GFR, Estimated: >60 mL/min (ref >60)
GFR, Estimated: >60 mL/min (ref >60)
Glucose, Bld: 212 mg/dL — ABNORMAL HIGH (ref 70–99)
Glucose, Bld: 164 mg/dL — ABNORMAL HIGH (ref 70–99)
Glucose, Bld: 147 mg/dL — ABNORMAL HIGH (ref 70–99)
Glucose, Bld: 181 mg/dL — ABNORMAL HIGH (ref 70–99)
Potassium: 3.7 mmol/L (ref 3.5–5.1)
Potassium: 3.6 mmol/L (ref 3.5–5.1)
Potassium: 3.8 mmol/L (ref 3.5–5.1)
Potassium: 3.5 mmol/L (ref 3.5–5.1)
Sodium: 130 mmol/L — ABNORMAL LOW (ref 135–145)
Sodium: 131 mmol/L — ABNORMAL LOW (ref 135–145)
Sodium: 135 mmol/L (ref 135–145)
Sodium: 134 mmol/L — ABNORMAL LOW (ref 135–145)

## 2022-11-19 LAB — GLUCOSE, CAPILLARY
Glucose-Capillary: 136 mg/dL — ABNORMAL HIGH (ref 70–99)
Glucose-Capillary: 145 mg/dL — ABNORMAL HIGH (ref 70–99)
Glucose-Capillary: 151 mg/dL — ABNORMAL HIGH (ref 70–99)
Glucose-Capillary: 153 mg/dL — ABNORMAL HIGH (ref 70–99)
Glucose-Capillary: 158 mg/dL — ABNORMAL HIGH (ref 70–99)
Glucose-Capillary: 163 mg/dL — ABNORMAL HIGH (ref 70–99)
Glucose-Capillary: 173 mg/dL — ABNORMAL HIGH (ref 70–99)
Glucose-Capillary: 178 mg/dL — ABNORMAL HIGH (ref 70–99)
Glucose-Capillary: 181 mg/dL — ABNORMAL HIGH (ref 70–99)
Glucose-Capillary: 182 mg/dL — ABNORMAL HIGH (ref 70–99)
Glucose-Capillary: 203 mg/dL — ABNORMAL HIGH (ref 70–99)
Glucose-Capillary: 256 mg/dL — ABNORMAL HIGH (ref 70–99)
Glucose-Capillary: 276 mg/dL — ABNORMAL HIGH (ref 70–99)

## 2022-11-19 LAB — BETA-HYDROXYBUTYRIC ACID
Beta-Hydroxybutyric Acid: 1.17 mmol/L — ABNORMAL HIGH (ref 0.05–0.27)
Beta-Hydroxybutyric Acid: 1.74 mmol/L — ABNORMAL HIGH (ref 0.05–0.27)

## 2022-11-19 LAB — HEMOGLOBIN A1C
Hgb A1c MFr Bld: 10.5 % — ABNORMAL HIGH (ref 4.8–5.6)
Mean Plasma Glucose: 254.65 mg/dL

## 2022-11-19 LAB — HIV ANTIBODY (ROUTINE TESTING W REFLEX): HIV Screen 4th Generation wRfx: NONREACTIVE

## 2022-11-19 LAB — MRSA NEXT GEN BY PCR, NASAL: MRSA by PCR Next Gen: NOT DETECTED

## 2022-11-19 MED ORDER — INSULIN ASPART 100 UNIT/ML IJ SOLN
0.0000 [IU] | Freq: Every day | INTRAMUSCULAR | Status: DC
  Administered 2022-11-19: 3 [IU] via SUBCUTANEOUS

## 2022-11-19 MED ORDER — CHLORHEXIDINE GLUCONATE CLOTH 2 % EX PADS: 6.0000 | MEDICATED_PAD | Freq: Every day | CUTANEOUS | Status: DC

## 2022-11-19 MED ORDER — INSULIN ASPART 100 UNIT/ML IJ SOLN
0.0000 [IU] | Freq: Three times a day (TID) | INTRAMUSCULAR | Status: DC
  Administered 2022-11-19: 5 [IU] via SUBCUTANEOUS
  Administered 2022-11-19: 2 [IU] via SUBCUTANEOUS
  Administered 2022-11-19: 3 [IU] via SUBCUTANEOUS
  Administered 2022-11-20: 2 [IU] via SUBCUTANEOUS
  Administered 2022-11-20: 1 [IU] via SUBCUTANEOUS
  Administered 2022-11-21: 2 [IU] via SUBCUTANEOUS

## 2022-11-19 MED ORDER — INSULIN GLARGINE-YFGN 100 UNIT/ML ~~LOC~~ SOLN: 15.0000 [IU] | Freq: Every day | SUBCUTANEOUS | Status: DC

## 2022-11-19 MED ORDER — SODIUM CHLORIDE 0.9 % IV SOLN: INTRAVENOUS | Status: DC

## 2022-11-19 MED ORDER — ORAL CARE MOUTH RINSE: 15.0000 mL | OROMUCOSAL | Status: DC | PRN

## 2022-11-19 MED ORDER — INSULIN GLARGINE-YFGN 100 UNIT/ML ~~LOC~~ SOLN
15.0000 [IU] | Freq: Every day | SUBCUTANEOUS | Status: DC
  Administered 2022-11-19 – 2022-11-20 (×2): 15 [IU] via SUBCUTANEOUS
  Filled 2022-11-19 (×3): qty 0.15

## 2022-11-19 NOTE — Progress Notes (Signed)
Patient arrived to [facility] as a transfer from ICU. Vital signs WNL.

## 2022-11-19 NOTE — Progress Notes (Signed)
PROGRESS NOTE  Cathy Bond ZOX:[id number] DOB: 1971-03-13 DOA: 11/18/2022 PCP: [name], MD   LOS: 1 day   Brief Narrative / Interim history: 52 year old female with history of DM2, HTN comes into the hospital with several days of nausea, vomiting, abdominal pain and diarrhea.  She was seen at urgent care 4/26, diagnosed with viral gastroenteritis and sent home.  Labs were stable at that time.  She came back to the hospital due to persistent symptoms, feeling very dehydrated, and was found to have in DKA.  She was placed on insulin infusion and admitted to stepdown.  Subjective / 24h Interval events: She feels very groggy/dizzy this morning, also very sleepy.  No longer has nausea and vomiting.  Was able to have a little bit of breakfast but not too much  Assesement and Plan: Principal Problem:   DKA (diabetic ketoacidosis) (HCC) Active Problems:   HTN (hypertension)  Principal problem DKA, underlying DM2 -difficult to say clear cause, it is possible that she may have had a viral gastroenteritis that precipitated her DKA versus her previous urgent care presentation was in fact early DKA.  In addition, she is on Jardiance at home who is known to cause DKA even after long-term use.  Jardiance discontinued, recommend she goes off. -Last A1c was more than 2 years ago, repeat A1c pending -Suspect she will need to have insulin, perhaps start with once a day long-acting, to prevent further episodes of DKA.  Diabetes coordinator consult pending  Active problems AKI -likely due to dehydration, kidney function resolved with fluids  Essential hypertension -lisinopril on hold, continue to hold  Dizziness, weakness -likely due to her GI illness.  Obtain orthostatics.  Continue fluids  Hyponatremia -pseudohyponatremia in the setting of hyperglycemia, also dehydration.  Sodium now normalized.  Still remains with slight NAGMA due to GI losses.  On fluids, encourage p.o. intake  Scheduled  Meds:  Chlorhexidine Gluconate Cloth  6 each Topical Daily   enoxaparin (LOVENOX) injection  40 mg Subcutaneous Q24H   insulin aspart  0-5 Units Subcutaneous QHS   insulin aspart  0-9 Units Subcutaneous TID WC   insulin glargine-yfgn  15 Units Subcutaneous Daily   Continuous Infusions:  sodium chloride 50 mL/hr at 11/19/22 1009   PRN Meds:.dextrose, mouth rinse  Current Outpatient Medications  Medication Instructions   cetirizine (ZYRTEC) 10 MG tablet TAKE 1 TABLET(10 MG) BY MOUTH DAILY   diclofenac Sodium (VOLTAREN) 2 g, Topical, 4 times daily   EPINEPHrine (EPI-PEN) 0.3 mg, Intramuscular, As needed   glucose blood (FREESTYLE PRECISION NEO TEST) test strip Use as instructed to test blood glucose twice daily   JARDIANCE 25 MG TABS tablet TAKE 1 TABLET BY MOUTH DAILY   Lancets (FREESTYLE) lancets Use as instructed to test blood glucose twice daily   losartan (COZAAR) 50 MG tablet TAKE 1 TABLET(50 MG) BY MOUTH DAILY   mometasone (NASONEX) 50 MCG/ACT nasal spray 2 sprays, Nasal, Daily   Mounjaro 10 mg, Subcutaneous, Weekly   Multiple Vitamin (MULITIVITAMIN WITH MINERALS) TABS 1 tablet, Daily   Multiple Vitamins-Minerals (HAIR/SKIN/NAILS PO) 2 tablets, Oral, 2 times daily   nitroGLYCERIN (NITROSTAT) 0.3 mg, Sublingual, Every 5 min PRN   ondansetron (ZOFRAN-ODT) 4 mg, Oral, Every 8 hours PRN   polyethylene glycol powder (GLYCOLAX/MIRALAX) 17 g, Oral, Daily   rosuvastatin (CRESTOR) 20 MG tablet TAKE 1 TABLET(20 MG) BY MOUTH DAILY    Diet Orders (From admission, onward)     Start     Ordered  11/19/22 1610  Diet Carb Modified Fluid consistency: Thin; Room service appropriate? Yes  Diet effective now       Question Answer Comment  Diet-HS Snack? Nothing   Calorie Level Medium 1600-2000   Fluid consistency: Thin   Room service appropriate? Yes      11/19/22 0652            DVT prophylaxis: enoxaparin (LOVENOX) injection 40 mg Start: 11/19/22 1400   Lab Results  Component  Value Date   PLT 297 11/18/2022      Code Status: Full Code  Family Communication: s/o at bedside   Status is: Inpatient  Remains inpatient appropriate because: persistent symptoms  Level of care: Stepdown  Consultants:  none  Objective: Vitals:   11/19/22 0932 11/19/22 1000 11/19/22 1013 11/19/22 1023  BP:   133/69   Pulse: 67 68  90  Resp: 14 (!) 21  16  Temp:      TempSrc:      SpO2: 95% 99%  99%  Weight:      Height:        Intake/Output Summary (Last 24 hours) at 11/19/2022 1117 Last data filed at 11/19/2022 0500 Gross per 24 hour  Intake 1892.95 ml  Output --  Net 1892.95 ml   Wt Readings from Last 3 Encounters:  11/19/22 78.4 kg  11/12/22 80.1 kg  11/05/22 78.9 kg    Examination:  Constitutional: NAD Eyes: no scleral icterus ENMT: Mucous membranes are moist.  Neck: normal, supple Respiratory: clear to auscultation bilaterally, no wheezing, no crackles. Normal respiratory effort. No accessory muscle use.  Cardiovascular: Regular rate and rhythm, no murmurs / rubs / gallops. No LE edema.  Abdomen: non distended, no tenderness. Bowel sounds positive.  Musculoskeletal: no clubbing / cyanosis.   Data Reviewed: I have independently reviewed following labs and imaging studies   CBC Recent Labs  Lab 11/18/22 1819  WBC 8.0  HGB 13.9  HCT 41.3  PLT 297  MCV 83.8  MCH 28.2  MCHC 33.7  RDW 13.7  LYMPHSABS 1.1  MONOABS 0.5  EOSABS 0.0  BASOSABS 0.0    Recent Labs  Lab 11/18/22 1819 11/18/22 2126 11/19/22 0116 11/19/22 0527 11/19/22 0940  NA 131* 134* 130* 131* 135  K 4.9 5.0 3.7 3.6 3.8  CL 102 110 110 109 111  CO2 <7* 8* 11* 14* 17*  GLUCOSE 417* 384* 212* 164* 147*  BUN 29* 26* 24* 21* 18  CREATININE 1.32* 1.33* 0.97 0.86 0.87  CALCIUM 8.9 7.9* 8.0* 7.9* 8.5*  AST 36  --   --   --   --   ALT 70*  --   --   --   --   ALKPHOS 71  --   --   --   --   BILITOT 1.4*  --   --   --   --   ALBUMIN 4.0  --   --   --   --      ------------------------------------------------------------------------------------------------------------------ No results for input(s): "CHOL", "HDL", "LDLCALC", "TRIG", "CHOLHDL", "LDLDIRECT" in the last 72 hours.  Lab Results  Component Value Date   HGBA1C 8.9 (A) 10/02/2022   ------------------------------------------------------------------------------------------------------------------ No results for input(s): "TSH", "T4TOTAL", "T3FREE", "THYROIDAB" in the last 72 hours.  Invalid input(s): "FREET3"  Cardiac Enzymes No results for input(s): "CKMB", "TROPONINI", "MYOGLOBIN" in the last 168 hours.  Invalid input(s): "CK" ------------------------------------------------------------------------------------------------------------------ No results found for: "BNP"  CBG: Recent Labs  Lab 11/19/22 0604 11/19/22 0701 11/19/22  0454 11/19/22 0856 11/19/22 0951  GLUCAP 163* 158* 153* 145* 151*    Recent Results (from the past 240 hour(s))  MRSA Next Gen by PCR, Nasal     Status: None   Collection Time: 11/19/22 12:21 AM   Specimen: Nasal Mucosa; Nasal Swab  Result Value Ref Range Status   MRSA by PCR Next Gen NOT DETECTED NOT DETECTED Final    Comment: (NOTE) The GeneXpert MRSA Assay (FDA approved for NASAL specimens only), is one component of a comprehensive MRSA colonization surveillance program. It is not intended to diagnose MRSA infection nor to guide or monitor treatment for MRSA infections. Test performance is not FDA approved in patients less than 46 years old. Performed at [facility], [address] [place], [place] [address]      Radiology Studies: No results found.   Cathy Pert, MD, PhD [facility]  Between 7 am - 7 pm I am available, please contact me via Amion (for emergencies) or Securechat (non urgent messages)  Between 7 pm - 7 am I am not available, please contact night coverage MD/APP via Amion

## 2022-11-20 ENCOUNTER — Other Ambulatory Visit (HOSPITAL_COMMUNITY): Payer: Self-pay

## 2022-11-20 ENCOUNTER — Telehealth (HOSPITAL_COMMUNITY): Payer: Self-pay | Admitting: Pharmacy Technician

## 2022-11-20 DIAGNOSIS — I10 Essential (primary) hypertension: Secondary | ICD-10-CM | POA: Diagnosis not present

## 2022-11-20 DIAGNOSIS — E1010 Type 1 diabetes mellitus with ketoacidosis without coma: Secondary | ICD-10-CM | POA: Diagnosis not present

## 2022-11-20 LAB — CBC
HCT: 29.9 % — ABNORMAL LOW (ref 36.0–46.0)
Hemoglobin: 10.5 g/dL — ABNORMAL LOW (ref 12.0–15.0)
MCH: 27.7 pg (ref 26.0–34.0)
MCHC: 35.1 g/dL (ref 30.0–36.0)
MCV: 78.9 fL — ABNORMAL LOW (ref 80.0–100.0)
Platelets: 186 10*3/uL (ref 150–400)
RBC: 3.79 MIL/uL — ABNORMAL LOW (ref 3.87–5.11)
RDW: 13.2 % (ref 11.5–15.5)
WBC: 3.6 10*3/uL — ABNORMAL LOW (ref 4.0–10.5)
nRBC: 0 % (ref 0.0–0.2)

## 2022-11-20 LAB — COMPREHENSIVE METABOLIC PANEL
ALT: 35 U/L (ref 0–44)
AST: 21 U/L (ref 15–41)
Albumin: 2.9 g/dL — ABNORMAL LOW (ref 3.5–5.0)
Alkaline Phosphatase: 56 U/L (ref 38–126)
Anion gap: 8 (ref 5–15)
BUN: 14 mg/dL (ref 6–20)
CO2: 17 mmol/L — ABNORMAL LOW (ref 22–32)
Calcium: 7.9 mg/dL — ABNORMAL LOW (ref 8.9–10.3)
Chloride: 111 mmol/L (ref 98–111)
Creatinine, Ser: 0.63 mg/dL (ref 0.44–1.00)
GFR, Estimated: >60 mL/min (ref >60)
Glucose, Bld: 148 mg/dL — ABNORMAL HIGH (ref 70–99)
Potassium: 3.1 mmol/L — ABNORMAL LOW (ref 3.5–5.1)
Sodium: 136 mmol/L (ref 135–145)
Total Bilirubin: 0.8 mg/dL (ref 0.3–1.2)
Total Protein: 5.7 g/dL — ABNORMAL LOW (ref 6.5–8.1)

## 2022-11-20 LAB — GLUCOSE, CAPILLARY
Glucose-Capillary: 150 mg/dL — ABNORMAL HIGH (ref 70–99)
Glucose-Capillary: 114 mg/dL — ABNORMAL HIGH (ref 70–99)
Glucose-Capillary: 163 mg/dL — ABNORMAL HIGH (ref 70–99)
Glucose-Capillary: 198 mg/dL — ABNORMAL HIGH (ref 70–99)

## 2022-11-20 LAB — MAGNESIUM: Magnesium: 2 mg/dL (ref 1.7–2.4)

## 2022-11-20 MED ORDER — MAGNESIUM OXIDE -MG SUPPLEMENT 400 (240 MG) MG PO TABS
400.0000 mg | ORAL_TABLET | Freq: Two times a day (BID) | ORAL | Status: AC
  Administered 2022-11-20 (×2): 400 mg via ORAL
  Filled 2022-11-20 (×2): qty 1

## 2022-11-20 MED ORDER — INSULIN ASPART 100 UNIT/ML IJ SOLN
4.0000 [IU] | Freq: Three times a day (TID) | INTRAMUSCULAR | Status: DC
  Administered 2022-11-20 – 2022-11-21 (×4): 4 [IU] via SUBCUTANEOUS

## 2022-11-20 MED ORDER — LIVING WELL WITH DIABETES BOOK
Freq: Once | Status: AC
  Filled 2022-11-20: qty 1

## 2022-11-20 MED ORDER — SODIUM CHLORIDE 0.9 % IV SOLN: INTRAVENOUS | Status: DC

## 2022-11-20 MED ORDER — POTASSIUM CHLORIDE CRYS ER 20 MEQ PO TBCR
40.0000 meq | EXTENDED_RELEASE_TABLET | Freq: Two times a day (BID) | ORAL | Status: AC
  Administered 2022-11-20 (×2): 40 meq via ORAL
  Filled 2022-11-20 (×2): qty 2

## 2022-11-20 MED ORDER — INSULIN STARTER KIT- PEN NEEDLES (ENGLISH)
1.0000 | Freq: Once | Status: AC
  Administered 2022-11-20: 1
  Filled 2022-11-20: qty 1

## 2022-11-20 NOTE — Inpatient Diabetes Management (Signed)
Inpatient Diabetes Program Recommendations  AACE/ADA: New Consensus Statement on Inpatient Glycemic Control (2015)  Target Ranges:  Prepandial:   less than 140 mg/dL      Peak postprandial:   less than 180 mg/dL (1-2 hours)      Critically ill patients:  140 - 180 mg/dL   Lab Results  Component Value Date   GLUCAP 276 (H) 11/19/2022   HGBA1C 10.5 (H) 11/19/2022    Review of Glycemic Control  Diabetes history: DM2 Outpatient Diabetes medications: Jardiance 25 QD, Jardiance 25 mg QDm Mounjaro 10 mg weekly Current orders for Inpatient glycemic control: Semglee 15 QD, Novolog 0-9 units TID with meals and 0-5 HS  HgbA1C - 10.5% Glucose 148 this am DKA likely from SGLT2 13 units of Novolog on 4/30 for correction  Inpatient Diabetes Program Recommendations:    Agree with orders  Spoke with pt at bedside regarding her diabetes, DKA, and HgbA1C. Pt states she hasn't been checking blood sugars on a regular basis over the past few weeks when not feeling well. Tries to eat healthy, although drinking juices. Discussed diabetes disease process and treatment options. Discussed physiology of diabetes and role of obesity on insulin resistance. Encouraged moderate weight reduction to improve glucose levels. Discussed role of medications and diet in glucose control. Discussed hypoglycemia s/s and treatment. Instructed pt to monitor blood sugars at least 3x/day and f/u with PCP/Endo.  Demonstrated insulin pen use and answered questions regarding same. Ordered Living Well book and insulin pen starter kit.  Recommendations for discharge:  Semglee 15 units QD Novolog 4 units TID with meals D/C Jardiance  F/U this am.   Thank you. Ailene Ards, RD, LDN, CDCES Inpatient Diabetes Coordinator [phone]

## 2022-11-20 NOTE — TOC Initial Note (Signed)
Transition of Care [facility]) - Initial/Assessment Note    Patient Details  Name: Cathy Bond MRN: [id number] Date of Birth: 1971-04-17  Transition of Care  [facility]) CM/SW Contact:    Durenda Guthrie, RN Phone Number: 11/20/2022, 9:01 AM  Clinical Narrative:                  Transition of Care [facility]) Department has reviewed patient and no TOC needs have been identified at this time. We will continue to monitor patient advancement through Interdisciplinary progressions and if new patient needs arise, please place a consult.        Patient Goals and CMS Choice            Expected Discharge Plan and Services                                              Prior Living Arrangements/Services                       Activities of Daily Living Home Assistive Devices/Equipment: None ADL Screening (condition at time of admission) Patient's cognitive ability adequate to safely complete daily activities?: Yes Is the patient deaf or have difficulty hearing?: No Does the patient have difficulty seeing, even when wearing glasses/contacts?: No Does the patient have difficulty concentrating, remembering, or making decisions?: No Patient able to express need for assistance with ADLs?: Yes Does the patient have difficulty dressing or bathing?: No Independently performs ADLs?: Yes (appropriate for developmental age) Does the patient have difficulty walking or climbing stairs?: No Weakness of Legs: None Weakness of Arms/Hands: None  Permission Sought/Granted                  Emotional Assessment              Admission diagnosis:  DKA (diabetic ketoacidosis) (HCC) [E11.10] Diabetic ketoacidosis without coma associated with type 1 diabetes mellitus (HCC) [E10.10] Nausea and vomiting, unspecified vomiting type [R11.2] Patient Active Problem List   Diagnosis Date Noted   DKA (diabetic ketoacidosis) (HCC) 11/18/2022   Constipation 09/27/2022   Nasal polyp  12/14/2021   Atypical squamous cells of undetermined significance on cytologic smear of cervix (ASC-US) 10/12/2020   Duodenal ulcer 09/28/2020   Healthcare maintenance 09/20/2019   Onychomycosis 09/24/2018   Contact dermatitis 05/19/2015   Uterine fibroid 05/25/2014   Endometrial polyp 05/25/2014   Amenorrhea 05/17/2014   Lump of right breast 09/10/2012   HTN (hypertension) 09/08/2012   Menorrhagia 12/27/2011   Type 2 diabetes mellitus without complication, without long-term current use of insulin (HCC) 12/09/2011   Acid reflux 11/19/2011   Back pain 08/23/2011   Depression 08/08/2011   Bipolar disorder (HCC) 02/28/2011   Allergic rhinitis due to pollen 10/18/2010   OBESITY 10/23/2009   Urticaria 09/18/2006   PCP:  [name], MD Pharmacy:   [facility] [phone] - [place], [place] - [address] AT [facility] OF [address] [place] [address] Phone: [phone] Fax: [phone]  [id number] [place], [place] - [address] AT [facility] [address] [place] [address] Phone: [phone] Fax: [phone]     Social Determinants of Health (SDOH) Social History: SDOH Screenings   Food Insecurity: No Food Insecurity (11/19/2022)  Housing: Low Risk  (11/19/2022)  Transportation Needs: No Transportation Needs (11/19/2022)  Utilities: Not At Risk (11/19/2022)  Alcohol Screen: Low Risk  (11/05/2022)  Depression (PHQ2-9): Low Risk  (11/05/2022)  Financial Resource Strain: Low Risk  (11/05/2022)  Physical Activity: Inactive (11/05/2022)  Social Connections: Moderately Integrated (11/05/2022)  Stress: No Stress Concern Present (11/05/2022)  Tobacco Use: Medium Risk (11/18/2022)   SDOH Interventions:     Readmission Risk Interventions     No data to display

## 2022-11-20 NOTE — Progress Notes (Signed)
Mobility Specialist - Progress Note   11/20/22 1224  Mobility  Activity Ambulated with assistance in hallway  Level of Assistance Standby assist, set-up cues, supervision of patient - no hands on  Assistive Device Other (Comment) (iv pole)  Distance Ambulated (ft) 200 ft  Range of Motion/Exercises Active  Activity Response Tolerated well  Mobility Referral Yes  $Mobility charge 1 Mobility   Pt received in bed and agreed to mobility, had no issues throughout session, took one rest break to combat fatigue, returned to chair with all needs met.  Marilynne Halsted Mobility Specialist

## 2022-11-20 NOTE — TOC Benefit Eligibility Note (Signed)
Patient Product/process development scientist completed.    The patient is currently admitted and upon discharge could be taking Lantus Pen.  The current 30 day co-pay is $0.00.   The patient is currently admitted and upon discharge could be taking Novolog Pen.  The current 30 day co-pay is $0.00.   The patient is insured through [employer] Part D   This test claim was processed through [facility]- copay amounts may vary at other pharmacies due to pharmacy/plan contracts, or as the patient moves through the different stages of their insurance plan.  Cathy Bond, CPHT Pharmacy Patient Advocate Specialist [facility] Patient Advocate Team Direct Number: [phone]  Fax: [phone]

## 2022-11-20 NOTE — Telephone Encounter (Signed)
Pharmacy Patient Advocate Encounter  Insurance verification completed.    The patient is insured through [employer] Part D   The patient is currently admitted and ran test claims for the following: Novolog, Lantus.  Copays and coinsurance results were relayed to Inpatient clinical team.  

## 2022-11-20 NOTE — Progress Notes (Signed)
[facility] PROGRESS NOTE    Progress Note  Cathy Bond  ZOX:[id number] DOB: 1970-10-31 DOA: 11/18/2022 PCP: [name], MD     Brief Narrative:   Cathy Bond is an 52 y.o. female past medical history of diabetes mellitus type 2, essential hypertension seen in urgent care on 11/15/2022 for viral gastroenteritis came back to the hospital feeling dehydrated found in DKA  Assessment/Plan:   Diabetes mellitus type 2/ DKA (diabetic ketoacidosis) (HCC) Jardiance was discontinued. She was started on IV insulin now transition to long-acting insulin plus sliding scale blood glucose fairly controlled. Hemoglobin A1c of 10.5 she will need to go home on insulin. She will need to go home off [place]. Bicarb still less than 20.  Acute kidney injury: Likely.  Notes that he resolved with IV fluid hydration  Essential hypertension: Lisinopril was held on admission due to acute kidney injury, blood pressure is currently stable.  Lightheadedness upon standing: Resolved with IV fluid resuscitation.  Normocytic anemia: Follow-up with PCP as an outpatient.  Hypokalemia: Replete orally recheck in the morning.   DVT prophylaxis: Lovenox Family Communication:none Status is: Inpatient Remains inpatient appropriate because: DKA    Code Status:     Code Status Orders  (From admission, onward)           Start     Ordered   11/18/22 2115  Full code  Continuous       Question:  By:  Answer:  Other   11/18/22 2117           Code Status History     Date Active Date Inactive Code Status Order ID Comments User Context   09/06/2012 2024 09/07/2012 1615 Full Code [id number]  Andrena Mews, DO Inpatient         IV Access:   Peripheral IV   Procedures and diagnostic studies:   No results found.   Medical Consultants:   None.   Subjective:    Cathy Bond no complaints feels better.  Objective:    Vitals:   11/19/22 1200 11/19/22 1215  11/19/22 2039 11/20/22 0420  BP:  (!) 93/54 (!) 114/51 (!) 102/59  Pulse:  74 85 81  Resp:  19 20   Temp: 99 F (37.2 C) 98.1 F (36.7 C) 98.9 F (37.2 C) 98.7 F (37.1 C)  TempSrc: Oral Oral Oral Oral  SpO2:  98% 99% 98%  Weight:      Height:       SpO2: 98 %  No intake or output data in the 24 hours ending 11/20/22 0826 Filed Weights   11/18/22 1809 11/19/22 0013  Weight: 79.8 kg 78.4 kg    Exam: General exam: In no acute distress. Respiratory system: Good air movement and clear to auscultation. Cardiovascular system: S1 & S2 heard, RRR. No JVD. Gastrointestinal system: Abdomen is nondistended, soft and nontender.  Extremities: No pedal edema. Skin: No rashes, lesions or ulcers Psychiatry: Judgement and insight appear normal. Mood & affect appropriate.    Data Reviewed:    Labs: Basic Metabolic Panel: Recent Labs  Lab 11/19/22 0116 11/19/22 0527 11/19/22 0940 11/19/22 1243 11/20/22 0538  NA 130* 131* 135 134* 136  K 3.7 3.6 3.8 3.5 3.1*  CL 110 109 111 110 111  CO2 11* 14* 17* 15* 17*  GLUCOSE 212* 164* 147* 181* 148*  BUN 24* 21* 18 16 14   CREATININE 0.97 0.86 0.87 0.85 0.63  CALCIUM 8.0* 7.9* 8.5* 8.3* 7.9*  MG  --   --   --   --  2.0   GFR Estimated Creatinine Clearance: 82.4 mL/min (by C-G formula based on SCr of 0.63 mg/dL). Liver Function Tests: Recent Labs  Lab 11/18/22 1819 11/20/22 0538  AST 36 21  ALT 70* 35  ALKPHOS 71 56  BILITOT 1.4* 0.8  PROT 8.1 5.7*  ALBUMIN 4.0 2.9*   Recent Labs  Lab 11/18/22 1819  LIPASE 89*   No results for input(s): "AMMONIA" in the last 168 hours. Coagulation profile No results for input(s): "INR", "PROTIME" in the last 168 hours. COVID-19 Labs  No results for input(s): "DDIMER", "FERRITIN", "LDH", "CRP" in the last 72 hours.  Lab Results  Component Value Date   SARSCOV2NAA NEGATIVE 08/01/2021    CBC: Recent Labs  Lab 11/18/22 1819 11/20/22 0538  WBC 8.0 3.6*  NEUTROABS 6.2  --   HGB  13.9 10.5*  HCT 41.3 29.9*  MCV 83.8 78.9*  PLT 297 186   Cardiac Enzymes: No results for input(s): "CKTOTAL", "CKMB", "CKMBINDEX", "TROPONINI" in the last 168 hours. BNP (last 3 results) No results for input(s): "PROBNP" in the last 8760 hours. CBG: Recent Labs  Lab 11/19/22 0951 11/19/22 1139 11/19/22 1748 11/19/22 2216 11/20/22 0729  GLUCAP 151* 203* 256* 276* 150*   D-Dimer: No results for input(s): "DDIMER" in the last 72 hours. Hgb A1c: Recent Labs    11/19/22 0527  HGBA1C 10.5*   Lipid Profile: No results for input(s): "CHOL", "HDL", "LDLCALC", "TRIG", "CHOLHDL", "LDLDIRECT" in the last 72 hours. Thyroid function studies: No results for input(s): "TSH", "T4TOTAL", "T3FREE", "THYROIDAB" in the last 72 hours.  Invalid input(s): "FREET3" Anemia work up: No results for input(s): "VITAMINB12", "FOLATE", "FERRITIN", "TIBC", "IRON", "RETICCTPCT" in the last 72 hours. Sepsis Labs: Recent Labs  Lab 11/18/22 1819 11/20/22 0538  WBC 8.0 3.6*   Microbiology Recent Results (from the past 240 hour(s))  MRSA Next Gen by PCR, Nasal     Status: None   Collection Time: 11/19/22 12:21 AM   Specimen: Nasal Mucosa; Nasal Swab  Result Value Ref Range Status   MRSA by PCR Next Gen NOT DETECTED NOT DETECTED Final    Comment: (NOTE) The GeneXpert MRSA Assay (FDA approved for NASAL specimens only), is one component of a comprehensive MRSA colonization surveillance program. It is not intended to diagnose MRSA infection nor to guide or monitor treatment for MRSA infections. Test performance is not FDA approved in patients less than 71 years old. Performed at [facility], [address], [place], [place] [address]      Medications:    enoxaparin (LOVENOX) injection  40 mg Subcutaneous Q24H   insulin aspart  0-5 Units Subcutaneous QHS   insulin aspart  0-9 Units Subcutaneous TID WC   insulin glargine-yfgn  15 Units Subcutaneous Daily   insulin starter  kit- pen needles  1 kit Other Once   living well with diabetes book   Does not apply Once   Continuous Infusions:  sodium chloride 75 mL/hr at 11/19/22 2241      LOS: 2 days   Marinda Elk  [facility]  11/20/2022, 8:26 AM

## 2022-11-20 NOTE — Inpatient Diabetes Management (Signed)
Inpatient Diabetes Program Recommendations  AACE/ADA: New Consensus Statement on Inpatient Glycemic Control (2015)  Target Ranges:  Prepandial:   less than 140 mg/dL      Peak postprandial:   less than 180 mg/dL (1-2 hours)      Critically ill patients:  140 - 180 mg/dL   Lab Results  Component Value Date   GLUCAP 150 (H) 11/20/2022   HGBA1C 10.5 (H) 11/19/2022    Review of Glycemic Control    Inpatient Diabetes Program Recommendations:    Discharge Recommendations: Long acting recommendations: Insulin Glargine (LANTUS) Solostar Pen 15 units QD  Short acting recommendations:  Meal coverage ONLY Insulin aspart (NOVOLOG) FlexPen  4 units TID with meals   Supply/Referral recommendations: Glucometer Test strips Lancet device Lancets Pen needles - standard   Discussed with MD, RN.  Thank you. Ailene Ards, RD, LDN, CDCES Inpatient Diabetes Coordinator [phone]

## 2022-11-21 ENCOUNTER — Other Ambulatory Visit (HOSPITAL_COMMUNITY): Payer: Self-pay

## 2022-11-21 DIAGNOSIS — E1010 Type 1 diabetes mellitus with ketoacidosis without coma: Secondary | ICD-10-CM | POA: Diagnosis not present

## 2022-11-21 DIAGNOSIS — R112 Nausea with vomiting, unspecified: Secondary | ICD-10-CM | POA: Diagnosis not present

## 2022-11-21 LAB — C-PEPTIDE: C-Peptide: 2.2 ng/mL (ref 1.1–4.4)

## 2022-11-21 LAB — GLUCOSE, CAPILLARY: Glucose-Capillary: 163 mg/dL — ABNORMAL HIGH (ref 70–99)

## 2022-11-21 MED ORDER — LANCETS MISC
1.0000 | Freq: Three times a day (TID) | 0 refills | Status: AC
  Filled 2022-11-21: qty 100, 33d supply, fill #0

## 2022-11-21 MED ORDER — PEN NEEDLES 31G X 5 MM MISC
1.0000 | Freq: Three times a day (TID) | 0 refills | Status: DC
  Filled 2022-11-21: qty 100, 30d supply, fill #0

## 2022-11-21 MED ORDER — LORATADINE 10 MG PO TABS
10.0000 mg | ORAL_TABLET | Freq: Every day | ORAL | Status: DC
  Administered 2022-11-21: 10 mg via ORAL
  Filled 2022-11-21: qty 1

## 2022-11-21 MED ORDER — INSULIN GLARGINE 100 UNIT/ML SOLOSTAR PEN
20.0000 [IU] | PEN_INJECTOR | Freq: Every day | SUBCUTANEOUS | 0 refills | Status: DC
Start: 2022-11-21 — End: 2023-02-04
  Filled 2022-11-21: qty 15, 75d supply, fill #0

## 2022-11-21 MED ORDER — INSULIN ASPART 100 UNIT/ML FLEXPEN
5.0000 [IU] | PEN_INJECTOR | Freq: Three times a day (TID) | SUBCUTANEOUS | 0 refills | Status: DC
Start: 2022-11-21 — End: 2023-02-04
  Filled 2022-11-21: qty 15, 100d supply, fill #0

## 2022-11-21 MED ORDER — BLOOD GLUCOSE TEST VI STRP
1.0000 | ORAL_STRIP | Freq: Three times a day (TID) | 0 refills | Status: AC
Start: 2022-11-21 — End: ?
  Filled 2022-11-21: qty 100, 34d supply, fill #0

## 2022-11-21 MED ORDER — BLOOD GLUCOSE MONITOR SYSTEM W/DEVICE KIT
1.0000 | PACK | Freq: Three times a day (TID) | 0 refills | Status: AC
Start: 2022-11-21 — End: ?
  Filled 2022-11-21: qty 1, 30d supply, fill #0

## 2022-11-21 MED ORDER — LANCET DEVICE MISC
1.0000 | Freq: Three times a day (TID) | 0 refills | Status: AC
  Filled 2022-11-21: qty 1, fill #0

## 2022-11-21 NOTE — Discharge Summary (Signed)
Physician Discharge Summary  Cathy Bond:[id number] DOB: March 01, 1971 DOA: 11/18/2022  PCP: [name], MD  Admit date: 11/18/2022 Discharge date: 11/21/2022  Admitted From: Home Disposition:  Home  Recommendations for Outpatient Follow-up:  Follow up with PCP in 1-2 weeks Please obtain BMP/CBC in one week   Home Health:No Equipment/Devices:None  Discharge Condition:Stable CODE STATUS:Full Diet recommendation: Heart Healthy  Brief/Interim Summary: 52 y.o. female past medical history of diabetes mellitus type 2, essential hypertension seen in urgent care on 11/15/2022 for viral gastroenteritis came back to the hospital feeling dehydrated found in DKA   Discharge Diagnoses:  Principal Problem:   DKA (diabetic ketoacidosis) (HCC) Active Problems:   HTN (hypertension)  Diabetes mellitus type 2 uncontrolled/DKA: Jardiance was discontinued she was started on IV insulin anion gap closed blood glucose improved. She was transition to long-acting insulin plus sliding scale A1c was 10.5. She will go home on insulin. Follow-up with PCP 2 to 6 weeks check an A1c in 3 months.  Acute kidney injury: Likely due to DKA now resolved with IV fluids.  Essential hypertension: Due to acute kidney injury her lisinopril was held she will restart as an outpatient.  Lightheaded upon standing/orthostatic hypotension: Resolved with IV fluid resuscitation.  Normocytic anemia: Follow-up with PCP.  Hypokalemia: Repleted now resolved.  Discharge Instructions  Discharge Instructions     Diet - low sodium heart healthy   Complete by: As directed    Increase activity slowly   Complete by: As directed       Allergies as of 11/21/2022       Reactions   Apple Juice Hives, Itching   Patient allergic to applesauce, but not raw apples   Shellfish Allergy Hives, Itching, Swelling   Denies airway involvement   Wellbutrin [bupropion Hcl] Nausea Only   Significant   Metformin And  Related Other (See Comments)   Itching, Dry Skin, patient preference to stop as this happened with only 500mg  daily.         Medication List     STOP taking these medications    Jardiance 25 MG Tabs tablet Generic drug: empagliflozin   ondansetron 4 MG disintegrating tablet Commonly known as: ZOFRAN-ODT       TAKE these medications    Blood Glucose Monitoring Suppl Devi 1 each by Does not apply route 3 (three) times daily. May dispense any manufacturer covered by patient's insurance.   cetirizine 10 MG tablet Commonly known as: ZYRTEC TAKE 1 TABLET(10 MG) BY MOUTH DAILY   diclofenac Sodium 1 % Gel Commonly known as: Voltaren Apply 2 g topically 4 (four) times daily.   EPINEPHrine 0.3 mg/0.3 mL Soaj injection Commonly known as: EPI-PEN Inject 0.3 mg into the muscle as needed for anaphylaxis.   freestyle lancets Use as instructed to test blood glucose twice daily What changed: Another medication with the same name was added. Make sure you understand how and when to take each.   Lancets Misc 1 each by Does not apply route 3 (three) times daily. Use as directed to check blood sugar. May dispense any manufacturer covered by patient's insurance and fits patient's device. What changed: You were already taking a medication with the same name, and this prescription was added. Make sure you understand how and when to take each.   FreeStyle Precision Neo Test test strip Generic drug: glucose blood Use as instructed to test blood glucose twice daily What changed: Another medication with the same name was added. Make sure you understand  how and when to take each.   BLOOD GLUCOSE TEST STRIPS Strp 1 each by Does not apply route 3 (three) times daily. Use as directed to check blood sugar. May dispense any manufacturer covered by patient's insurance and fits patient's device. What changed: You were already taking a medication with the same name, and this prescription was added.  Make sure you understand how and when to take each.   HAIR/SKIN/NAILS PO Take 2 tablets by mouth 2 (two) times daily.   insulin aspart 100 UNIT/ML FlexPen Commonly known as: NOVOLOG Inject 5 Units into the skin 3 (three) times daily with meals. Only take if eating a meal AND Blood Glucose (BG) is 80 or higher.   insulin glargine 100 UNIT/ML Solostar Pen Commonly known as: LANTUS Inject 20 Units into the skin daily. May substitute as needed per insurance.   Lancet Device Misc 1 each by Does not apply route 3 (three) times daily. May dispense any manufacturer covered by patient's insurance.   losartan 50 MG tablet Commonly known as: COZAAR TAKE 1 TABLET(50 MG) BY MOUTH DAILY   mometasone 50 MCG/ACT nasal spray Commonly known as: NASONEX Place 2 sprays into the nose daily.   Mounjaro 5 MG/0.5ML Pen Generic drug: tirzepatide Inject 10 mg into the skin once a week.   multivitamin with minerals Tabs tablet Take 1 tablet by mouth daily.   nitroGLYCERIN 0.3 MG SL tablet Commonly known as: NITROSTAT Place 1 tablet (0.3 mg total) under the tongue every 5 (five) minutes as needed for chest pain.   Pen Needles 31G X 5 MM Misc 1 each by Does not apply route 3 (three) times daily. May dispense any manufacturer covered by patient's insurance.   polyethylene glycol powder 17 GM/SCOOP powder Commonly known as: GLYCOLAX/MIRALAX Take 17 g by mouth daily.   rosuvastatin 20 MG tablet Commonly known as: CRESTOR TAKE 1 TABLET(20 MG) BY MOUTH DAILY        Allergies  Allergen Reactions   Apple Juice Hives and Itching    Patient allergic to applesauce, but not raw apples   Shellfish Allergy Hives, Itching and Swelling    Denies airway involvement   Wellbutrin [Bupropion Hcl] Nausea Only    Significant   Metformin And Related Other (See Comments)    Itching, Dry Skin, patient preference to stop as this happened with only 500mg  daily.      Consultations: None   Procedures/Studies: No results found. (Echo, Carotid, EGD, Colonoscopy, ERCP)    Subjective: She has no new complaints feels great  Discharge Exam: Vitals:   11/20/22 2023 11/21/22 0514  BP: 131/74 112/64  Pulse: 71 67  Resp: 16 18  Temp: 98.4 F (36.9 C) 97.8 F (36.6 C)  SpO2: 100% 98%   Vitals:   11/20/22 0420 11/20/22 1326 11/20/22 2023 11/21/22 0514  BP: (!) 102/59 123/66 131/74 112/64  Pulse: 81 77 71 67  Resp:  18 16 18   Temp: 98.7 F (37.1 C) 98.2 F (36.8 C) 98.4 F (36.9 C) 97.8 F (36.6 C)  TempSrc: Oral Oral Oral Oral  SpO2: 98% 100% 100% 98%  Weight:      Height:        General: Pt is alert, awake, not in acute distress Cardiovascular: RRR, S1/S2 +, no rubs, no gallops Respiratory: CTA bilaterally, no wheezing, no rhonchi Abdominal: Soft, NT, ND, bowel sounds + Extremities: no edema, no cyanosis    The results of significant diagnostics from this hospitalization (including imaging, microbiology, ancillary  and laboratory) are listed below for reference.     Microbiology: Recent Results (from the past 240 hour(s))  MRSA Next Gen by PCR, Nasal     Status: None   Collection Time: 11/19/22 12:21 AM   Specimen: Nasal Mucosa; Nasal Swab  Result Value Ref Range Status   MRSA by PCR Next Gen NOT DETECTED NOT DETECTED Final    Comment: (NOTE) The GeneXpert MRSA Assay (FDA approved for NASAL specimens only), is one component of a comprehensive MRSA colonization surveillance program. It is not intended to diagnose MRSA infection nor to guide or monitor treatment for MRSA infections. Test performance is not FDA approved in patients less than 65 years old. Performed at [facility], [address] [place], [place] [address]      Labs: BNP (last 3 results) No results for input(s): "BNP" in the last 8760 hours. Basic Metabolic Panel: Recent Labs  Lab 11/19/22 0116 11/19/22 0527 11/19/22 0940  11/19/22 1243 11/20/22 0538  NA 130* 131* 135 134* 136  K 3.7 3.6 3.8 3.5 3.1*  CL 110 109 111 110 111  CO2 11* 14* 17* 15* 17*  GLUCOSE 212* 164* 147* 181* 148*  BUN 24* 21* 18 16 14   CREATININE 0.97 0.86 0.87 0.85 0.63  CALCIUM 8.0* 7.9* 8.5* 8.3* 7.9*  MG  --   --   --   --  2.0   Liver Function Tests: Recent Labs  Lab 11/18/22 1819 11/20/22 0538  AST 36 21  ALT 70* 35  ALKPHOS 71 56  BILITOT 1.4* 0.8  PROT 8.1 5.7*  ALBUMIN 4.0 2.9*   Recent Labs  Lab 11/18/22 1819  LIPASE 89*   No results for input(s): "AMMONIA" in the last 168 hours. CBC: Recent Labs  Lab 11/18/22 1819 11/20/22 0538  WBC 8.0 3.6*  NEUTROABS 6.2  --   HGB 13.9 10.5*  HCT 41.3 29.9*  MCV 83.8 78.9*  PLT 297 186   Cardiac Enzymes: No results for input(s): "CKTOTAL", "CKMB", "CKMBINDEX", "TROPONINI" in the last 168 hours. BNP: Invalid input(s): "POCBNP" CBG: Recent Labs  Lab 11/20/22 0729 11/20/22 1152 11/20/22 1642 11/20/22 2019 11/21/22 0741  GLUCAP 150* 114* 163* 198* 163*   D-Dimer No results for input(s): "DDIMER" in the last 72 hours. Hgb A1c Recent Labs    11/19/22 0527  HGBA1C 10.5*   Lipid Profile No results for input(s): "CHOL", "HDL", "LDLCALC", "TRIG", "CHOLHDL", "LDLDIRECT" in the last 72 hours. Thyroid function studies No results for input(s): "TSH", "T4TOTAL", "T3FREE", "THYROIDAB" in the last 72 hours.  Invalid input(s): "FREET3" Anemia work up No results for input(s): "VITAMINB12", "FOLATE", "FERRITIN", "TIBC", "IRON", "RETICCTPCT" in the last 72 hours. Urinalysis    Component Value Date/Time   COLORURINE STRAW (A) 11/18/2022 2041   APPEARANCEUR CLEAR 11/18/2022 2041   LABSPEC 1.016 11/18/2022 2041   PHURINE 5.0 11/18/2022 2041   GLUCOSEU >=500 (A) 11/18/2022 2041   HGBUR MODERATE (A) 11/18/2022 2041   BILIRUBINUR NEGATIVE 11/18/2022 2041   BILIRUBINUR small (A) 04/06/2020 1035   BILIRUBINUR NEG 06/28/2016 1000   KETONESUR 80 (A) 11/18/2022 2041    PROTEINUR 30 (A) 11/18/2022 2041   UROBILINOGEN 1.0 04/06/2020 1035   UROBILINOGEN 0.2 02/02/2014 0853   NITRITE NEGATIVE 11/18/2022 2041   LEUKOCYTESUR NEGATIVE 11/18/2022 2041   Sepsis Labs Recent Labs  Lab 11/18/22 1819 11/20/22 0538  WBC 8.0 3.6*   Microbiology Recent Results (from the past 240 hour(s))  MRSA Next Gen by PCR, Nasal  Status: None   Collection Time: 11/19/22 12:21 AM   Specimen: Nasal Mucosa; Nasal Swab  Result Value Ref Range Status   MRSA by PCR Next Gen NOT DETECTED NOT DETECTED Final    Comment: (NOTE) The GeneXpert MRSA Assay (FDA approved for NASAL specimens only), is one component of a comprehensive MRSA colonization surveillance program. It is not intended to diagnose MRSA infection nor to guide or monitor treatment for MRSA infections. Test performance is not FDA approved in patients less than 48 years old. Performed at [facility], [address] [place], [place] [address]      Time coordinating discharge: Over 30 minutes  SIGNED:   Marinda Elk, MD  [facility] 11/21/2022, 7:50 AM Pager   If 7PM-7AM, please contact night-coverage [email or web address] Password [email or web address]

## 2022-11-22 ENCOUNTER — Other Ambulatory Visit: Payer: Self-pay

## 2022-11-22 ENCOUNTER — Telehealth: Payer: Self-pay | Admitting: *Deleted

## 2022-11-22 ENCOUNTER — Ambulatory Visit (INDEPENDENT_AMBULATORY_CARE_PROVIDER_SITE_OTHER): Payer: Medicare HMO | Admitting: Student

## 2022-11-22 ENCOUNTER — Encounter: Payer: Self-pay | Admitting: Student

## 2022-11-22 VITALS — BP 104/72 | HR 78 | Ht 64.0 in | Wt 174.4 lb

## 2022-11-22 DIAGNOSIS — E1110 Type 2 diabetes mellitus with ketoacidosis without coma: Secondary | ICD-10-CM | POA: Diagnosis not present

## 2022-11-22 DIAGNOSIS — D649 Anemia, unspecified: Secondary | ICD-10-CM | POA: Diagnosis not present

## 2022-11-22 DIAGNOSIS — E119 Type 2 diabetes mellitus without complications: Secondary | ICD-10-CM

## 2022-11-22 MED ORDER — DEXCOM G6 TRANSMITTER MISC
1.0000 | 0 refills | Status: DC | PRN
Start: 2022-11-22 — End: 2022-12-12

## 2022-11-22 MED ORDER — DEXCOM G6 RECEIVER DEVI
1.0000 | Freq: Once | 0 refills | Status: AC
Start: 2022-11-22 — End: 2022-11-22

## 2022-11-22 MED ORDER — DEXCOM G6 SENSOR MISC
1.0000 | 2 refills | Status: DC | PRN
Start: 2022-11-22 — End: 2022-12-12

## 2022-11-22 NOTE — Transitions of Care (Post Inpatient/ED Visit) (Signed)
   11/22/2022  Name: Cathy Bond MRN: [id number] DOB: 08-Apr-1971  Today's TOC FU Call Status: Today's TOC FU Call Status:: Unsuccessul Call (1st Attempt) Unsuccessful Call (1st Attempt) Date: 11/22/22  Attempted to reach the patient regarding the most recent Inpatient/ED visit.  Follow Up Plan: Additional outreach attempts will be made to reach the patient to complete the Transitions of Care (Post Inpatient/ED visit) call.   Gean Maidens BSN RN [facility] [phone]

## 2022-11-22 NOTE — Patient Instructions (Addendum)
Your new MyChart password is [phone]. Please log in to change this.  We'll keep you on your current insulin dose for now. Keep an eye on your sugars. If you notice your readings are consistently above 200. Call me or send me a MyChart.   If you have ANY readings under 70, call me. Multiple readings under 90 should also warrant a call.   Keep trying to find the [facility] in stock if you can. We'll try to get you a continuous glucose monitor while you are on insulin.  I will see you back in June UNLESS you need to see me sooner.   Eliezer Mccoy, MD

## 2022-11-22 NOTE — Progress Notes (Unsigned)
    SUBJECTIVE:   CHIEF COMPLAINT / HPI:   DKA Hospital FU Recent hospitalization for DKA at [facility].  Thought to be related to her Jardiance use.  She is a type II diabetic, was previously on [place].  Last A1c 10.5.  She was discharged home from the hospital on insulin and instructed to continue her [facility].  Insulin regimen on discharge was 20 units of Lantus daily and 5 units of NovoLog with each meal. She has been taking this insulin as directed.  Unfortunately, has not been able to get her [place]gg[place] Keen yet as her pharmacy has not had it in stock.  She has been keeping track of her morning glucoses and brings in her fasting glucose log.  The day after discharge it was 290, subsequently has been trending down slowly, 250s and then 210s.   OBJECTIVE:   BP 104/72   Pulse 78   Ht 5\' 4"  (1.626 m)   Wt 174 lb 6.4 oz (79.1 kg)   SpO2 100%   BMI 29.94 kg/m   ***  ASSESSMENT/PLAN:   No problem-specific Assessment & Plan notes found for this encounter.     Eliezer Mccoy, MD [facility]

## 2022-11-23 DIAGNOSIS — D649 Anemia, unspecified: Secondary | ICD-10-CM | POA: Insufficient documentation

## 2022-11-23 LAB — ANEMIA PROFILE B
Basophils Absolute: 0 10*3/uL (ref 0.0–0.2)
Basos: 1 %
EOS (ABSOLUTE): 0.1 10*3/uL (ref 0.0–0.4)
Eos: 2 %
Ferritin: 410 ng/mL — ABNORMAL HIGH (ref 15–150)
Folate: >20 ng/mL (ref >3.0)
Hematocrit: 34.6 % (ref 34.0–46.6)
Hemoglobin: 11.9 g/dL (ref 11.1–15.9)
Immature Grans (Abs): 0 10*3/uL (ref 0.0–0.1)
Immature Granulocytes: 0 %
Iron Saturation: 48 % (ref 15–55)
Iron: 108 ug/dL (ref 27–159)
Lymphocytes Absolute: 1.2 10*3/uL (ref 0.7–3.1)
Lymphs: 25 %
MCH: 28.3 pg (ref 26.6–33.0)
MCHC: 34.4 g/dL (ref 31.5–35.7)
MCV: 82 fL (ref 79–97)
Monocytes Absolute: 0.2 10*3/uL (ref 0.1–0.9)
Monocytes: 5 %
Neutrophils Absolute: 3.2 10*3/uL (ref 1.4–7.0)
Neutrophils: 67 %
Platelets: 236 10*3/uL (ref 150–450)
RBC: 4.21 x10E6/uL (ref 3.77–5.28)
RDW: 14.6 % (ref 11.7–15.4)
Retic Ct Pct: 0.8 % (ref 0.6–2.6)
Total Iron Binding Capacity: 227 ug/dL — ABNORMAL LOW (ref 250–450)
UIBC: 119 ug/dL — ABNORMAL LOW (ref 131–425)
Vitamin B-12: 1284 pg/mL — ABNORMAL HIGH (ref 232–1245)
WBC: 4.8 10*3/uL (ref 3.4–10.8)

## 2022-11-23 LAB — BASIC METABOLIC PANEL
BUN/Creatinine Ratio: 23 (ref 9–23)
BUN: 19 mg/dL (ref 6–24)
CO2: 20 mmol/L (ref 20–29)
Calcium: 8.9 mg/dL (ref 8.7–10.2)
Chloride: 101 mmol/L (ref 96–106)
Creatinine, Ser: 0.82 mg/dL (ref 0.57–1.00)
Glucose: 213 mg/dL — ABNORMAL HIGH (ref 70–99)
Potassium: 3.9 mmol/L (ref 3.5–5.2)
Sodium: 138 mmol/L (ref 134–144)
eGFR: 85 mL/min/{1.73_m2} (ref >59)

## 2022-11-23 NOTE — Assessment & Plan Note (Addendum)
>>  ASSESSMENT AND PLAN FOR DKA (DIABETIC KETOACIDOSIS) (HCC) WRITTEN ON 11/23/2022  8:46 PM BY Amandeep Hogston B, MD  Followed expected course of resolution in hospital. 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 Hopeful she will not need exogenous insulins long-term. Discussed d/c'ing insulin today vs keeping her on this for 1-3 months. She elects to stay on insulin for the time being. Hopeful she can get a CGM. - CGM Rx sent to pharmacy' - Continue Lantus 20 units daily - Continue Novolog 5 units TID with meals - On a statin - Continue Mounjaro 10mg  weekly (taking 5mg  twice weekly due to issues with getting 10mg  pens) - No longer a candidate for further SGLT2i therapy. Adding to Allergy/Contraindication list  - Return in 3 months for A1c check--sooner if needing insulin dose adjustments. She will keep me abreast of glucose readings

## 2022-11-23 NOTE — Assessment & Plan Note (Signed)
Microcytic. Needs characterization. High suspicion for IDA. - Anemia profile B

## 2022-11-23 NOTE — Assessment & Plan Note (Signed)
Followed expected course of resolution in hospital. 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 Hopeful she will not need exogenous insulins long-term. Discussed d/c'ing insulin today vs keeping her on this for 1-3 months. She elects to stay on insulin for the time being. Hopeful she can get a CGM. - CGM Rx sent to pharmacy' - Continue Lantus 20 units daily - Continue Novolog 5 units TID with meals - On a statin - Continue Mounjaro 10mg  weekly (taking 5mg  twice weekly due to issues with getting 10mg  pens) - No longer a candidate for further SGLT2i therapy. Adding to Allergy/Contraindication list

## 2022-11-25 ENCOUNTER — Telehealth: Payer: Self-pay

## 2022-11-25 NOTE — Transitions of Care (Post Inpatient/ED Visit) (Signed)
   11/25/2022  Name: Cathy Bond MRN: [id number] DOB: 1971-01-21  Today's TOC FU Call Status: Today's TOC FU Call Status:: Unsuccessful Call (2nd Attempt) Unsuccessful Call (2nd Attempt) Date: 11/25/22  Attempted to reach the patient regarding the most recent Inpatient/ED visit.  Follow Up Plan: Additional outreach attempts will be made to reach the patient to complete the Transitions of Care (Post Inpatient/ED visit) call.   Jodelle Gross, RN, BSN, CCM Care Management Coordinator [place] Phone: [phone]/Fax: [phone]

## 2022-11-26 ENCOUNTER — Telehealth: Payer: Self-pay

## 2022-11-26 NOTE — Transitions of Care (Post Inpatient/ED Visit) (Signed)
   11/26/2022  Name: Cathy Bond MRN: [id number] DOB: [birth year]  Today's TOC FU Call Status: Today's TOC FU Call Status:: Unsuccessful Call (3rd Attempt) Unsuccessful Call (3rd Attempt) Date: 11/26/22  Attempted to reach the patient regarding the most recent Inpatient/ED visit.  Follow Up Plan: No further outreach attempts will be made at this time. We have been unable to contact the patient.  Jodelle Gross, RN, BSN, CCM Care Management Coordinator [place] Phone: [phone]/Fax: [phone]

## 2022-11-28 DIAGNOSIS — D1809 Hemangioma of other sites: Secondary | ICD-10-CM | POA: Diagnosis not present

## 2022-11-28 DIAGNOSIS — J3489 Other specified disorders of nose and nasal sinuses: Secondary | ICD-10-CM | POA: Diagnosis not present

## 2022-12-03 ENCOUNTER — Telehealth: Payer: Self-pay

## 2022-12-03 NOTE — Telephone Encounter (Signed)
Patient calls nurse line reporting continued high blood sugars.   She reports she has been using Novolog for ~ 1 weeks. She reports her sugars have been in the 200s. She reports yesterdays readings were 293 in the am and 312. She reports this morning 294. She reports 212 and 254 one day last week.   She reports she is asymptomatic. She reports she was told to call the office with no improvement with Novolog.  Will forward to PCP.   Precautions discussed.

## 2022-12-06 ENCOUNTER — Ambulatory Visit (INDEPENDENT_AMBULATORY_CARE_PROVIDER_SITE_OTHER): Payer: Medicare HMO | Admitting: Student

## 2022-12-06 ENCOUNTER — Encounter: Payer: Self-pay | Admitting: Student

## 2022-12-06 VITALS — BP 114/78 | HR 70 | Ht 64.0 in | Wt 177.0 lb

## 2022-12-06 DIAGNOSIS — M722 Plantar fascial fibromatosis: Secondary | ICD-10-CM

## 2022-12-06 DIAGNOSIS — E119 Type 2 diabetes mellitus without complications: Secondary | ICD-10-CM | POA: Diagnosis not present

## 2022-12-06 LAB — GLUCOSE, POCT (MANUAL RESULT ENTRY): POC Glucose: 130 mg/dl — AB (ref 70–99)

## 2022-12-06 MED ORDER — MOUNJARO 10 MG/0.5ML ~~LOC~~ SOAJ
10.0000 mg | SUBCUTANEOUS | 1 refills | Status: DC
Start: 2022-12-06 — End: 2023-05-29

## 2022-12-06 NOTE — Assessment & Plan Note (Signed)
Plantar fasciitis, first occurrence.  Reviewed stretching with a frozen water bottle versus tennis ball.  Supportive care measures reviewed with NSAIDs and Tylenol.  If no improvement over the next several weeks, could consider referral to sports medicine for orthotics.

## 2022-12-06 NOTE — Assessment & Plan Note (Signed)
GLP-1 agonist access remains a major issue for her.  Despite her reports of high blood pressures at home, I did hesitate to make any changes to her insulin regimen without better data.  Would be ideal if we could have a week or 2 of CGM wear before making any changes.  Will bring her back next week for a pharmacy visit to get her CGM set up and then consider changes based on that data.  Glucose is just 130 today in clinic. -Continue Lantus 20 units daily -Continue NovoLog 5 units 3 times daily with meals -Will restart Mounjaro 10 mg weekly once her pharmacy is able to get it

## 2022-12-06 NOTE — Patient Instructions (Addendum)
Cathy Bond,  Keep up the good work. I'm going to bring you back next week to see our pharmacist Dr. Raymondo Band next week to get your CGM set up.  Do you plantar fasciitis stretches with a frozen water bottle or tennis ball. Ibuprofen is a fine choice for treatment. If this doesn't get better in the next few weeks, we will send you to sports medicine to get some inserts made for your shoes.   Eliezer Mccoy, MD

## 2022-12-06 NOTE — Progress Notes (Signed)
    SUBJECTIVE:   CHIEF COMPLAINT / HPI:   T2DM Recent hospitalization for DKA.  Thought to be secondary to her SGLT2i use.  Now newly on insulin.  Using 20 units of Lantus daily and 5 units of NovoLog with each meal.  Unfortunately is NOT taking Mounjaro due ot pharmacy access issues. Despite [place] having tried doing 5mg  twice weekly dosing.  Here today because she had noticed that her glucose readings at home had been high, with multiple readings in the mid to upper 200s.  However, on review of her most recent glucose readings she had a reading of 170 this morning, she did eat breakfast after that and it is now 130 in clinic. She has been able to pick up a CGM but has not been able to get it set up.  Finds the whole process confusing.  R Foot Pain Sharp midfoot pain, present for the past few days.  Worse first thing in the morning.  Feels like she is stepping on glass.  Never had this before.  Has been taking some Tylenol which is only modestly helpful.  OBJECTIVE:   BP 114/78   Pulse 70   Ht 5\' 4"  (1.626 m)   Wt 177 lb (80.3 kg)   SpO2 97%   BMI 30.38 kg/m   General: alert & oriented, no apparent distress, well groomed HEENT: normocephalic, atraumatic, EOM grossly intact, oral mucosa moist, neck supple Respiratory: normal respiratory effort GI: non-distended Skin: no rashes, no jaundice MSK: Right foot with marked tenderness over the plantar fascia, without deformity or overlying skin changes. Psych: appropriate mood and affect   ASSESSMENT/PLAN:   Type 2 diabetes mellitus without complication, without long-term current use of insulin (HCC) GLP-1 agonist access remains a major issue for her.  Despite her reports of high blood pressures at home, I did hesitate to make any changes to her insulin regimen without better data.  Would be ideal if we could have a week or 2 of CGM wear before making any changes.  Will bring her back next week for a pharmacy visit to get her CGM set up and  then consider changes based on that data.  Glucose is just 130 today in clinic. -Continue Lantus 20 units daily -Continue NovoLog 5 units 3 times daily with meals -Will restart Mounjaro 10 mg weekly once her pharmacy is able to get it  Plantar fasciitis Plantar fasciitis, first occurrence.  Reviewed stretching with a frozen water bottle versus tennis ball.  Supportive care measures reviewed with NSAIDs and Tylenol.  If no improvement over the next several weeks, could consider referral to sports medicine for orthotics.     Eliezer Mccoy, MD [facility]

## 2022-12-09 DIAGNOSIS — I781 Nevus, non-neoplastic: Secondary | ICD-10-CM | POA: Diagnosis not present

## 2022-12-12 ENCOUNTER — Ambulatory Visit (INDEPENDENT_AMBULATORY_CARE_PROVIDER_SITE_OTHER): Payer: Medicare HMO | Admitting: Pharmacist

## 2022-12-12 ENCOUNTER — Encounter: Payer: Self-pay | Admitting: Pharmacist

## 2022-12-12 VITALS — BP 116/74 | HR 86 | Ht 64.0 in | Wt 177.4 lb

## 2022-12-12 DIAGNOSIS — E119 Type 2 diabetes mellitus without complications: Secondary | ICD-10-CM | POA: Diagnosis not present

## 2022-12-12 MED ORDER — PEN NEEDLES 31G X 5 MM MISC
11 refills | Status: DC
Start: 2022-12-12 — End: 2024-02-23

## 2022-12-12 MED ORDER — DEXCOM G6 TRANSMITTER MISC
1.0000 | 0 refills | Status: DC | PRN
Start: 2022-12-12 — End: 2023-05-29

## 2022-12-12 MED ORDER — DEXCOM G6 SENSOR MISC
1.0000 | 2 refills | Status: DC | PRN
Start: 2022-12-12 — End: 2023-08-14

## 2022-12-12 MED ORDER — GLUCOSE BLOOD VI STRP
ORAL_STRIP | 12 refills | Status: DC
Start: 2022-12-12 — End: 2023-12-24

## 2022-12-12 MED ORDER — DEXCOM G7 SENSOR MISC
11 refills | Status: DC
Start: 2022-12-12 — End: 2022-12-12

## 2022-12-12 NOTE — Assessment & Plan Note (Signed)
Diabetes longstanding, currently uncontrolled based on A1c. Patient is able to verbalize appropriate hypoglycemia management plan. Medication adherence appears appropriate. Attempted to switch patient from Dexcom G6 to Dexcom G7 due to ease of use; however, patient did not have compatible phone for Dexcom G7 app.  -Continued basal insulin Lantus (insulin glargine)  -Continued rapid insulin Novolog (insulin aspart) 5 units TID.  -Continued GLP-1 Mounjaro (tirzepatide) 10 mg weekly.  -Placed Dexcom G6 sensor and transmitter then paired with receiver. Educated patient on proper use and how to place sensor.  -Refilled pen needles and test strips.  -Extensively discussed pathophysiology of diabetes, recommended lifestyle interventions, dietary effects on blood sugar control.  -Counseled on s/sx of and management of hypoglycemia.  -Next A1c anticipated July 2024.

## 2022-12-12 NOTE — Progress Notes (Signed)
Reviewed and agree with Dr Koval's plan.   

## 2022-12-12 NOTE — Patient Instructions (Signed)
It was a pleasure seeing you today!  Please remember... 1. Sensor will last 10 days 2. Sensor should be applied to area away from waistband, scarring, tattoos, irritation, and bones. 3. Transmitter must be within 20 feet of receiver/cell phone. 4. If using Dexcom G7 app on cell phone, please remember to keep app open (do not close out of app). 5. Do a fingerstick blood glucose test if the sensor readings do not match how you feel 6. Remove sensor prior to magnetic resonance imaging (MRI), computed tomography (CT) scan, or high-frequency electrical heat  diathermy) treatment. 7. Do not allow sun screen or insect repellant to come into contact with Dexcom G7. These skin care products may lead for the plastic used in the Dexcom G7 to crack. 8. Dexcom G7 may be worn through a Industrial/product designer. It may not be exposed to an advanced Imaging Technology (AIT) body scanner (also called a millimeter wave scanner) or the baggage x-ray machine. Instead, ask for hand-wanding or full-body pat-down and visual inspection.  9. Doses of acetaminophen (Tylenol) >1 gram every 6 hours may cause false high readings. 10. Hydroxyurea (Hydrea, Droxia) may interfere with accuracy of blood glucose readings from Dexcom G7. 11. Store sensor kit between 36 and 86 degrees Farenheit. Can be refrigerated within this temperature range.  Ordering Overlay Patches 1. Go to the following website every 30 days to order new overlay patches:  [email or web address]  Problems with Dexcom sticking? 1. Order Skin Tac from [facility]. Alcohol swab area you plan to administer Dexcom then let dry. Once dry, apply Skin Tac in a circular motion (with a spot in the middle for sensor without skin tac) and let dry. Once dry you can apply Dexcom!  Problems taking off Dexcom? 1. Remember to try to shower before removing Dexcom 2. Order Tac Away to help remove any extra adhesive left on your skin once you remove  [facility]  Dexcom Customer Service Information Customer Sales Support (dexcom orders and general customer questions) Phone number: [phone] Monday - Friday  6 AM - 5 PM PST Saturday 8 AM - 4 PM PST  *Contact if you do not receive overlay patches  2. Global Technical Support (product troubleshooting or replacement inquiries) Phone number: [phone] Available 24 hours a day; 7 days a week  *Contact if you have a "bad" sensor. Remember to tell them you are wearing Dexcom on your stomach!  3. Dexcom Care (provides dexcom CGM training, software downloads, and tutorials) Phone number: [phone] Monday - Friday 6 AM - 5 PM PST Saturday 7 AM - 1:30 PM PST (All hours subject to change)  4. Website: [email or web address].dexcom.com/  Please do the following:  Continue checking blood sugars at home. It's really important that you record these and bring these in to your next doctor's appointment.  Continue making the lifestyle changes we've discussed together during our visit. Diet and exercise play a significant role in improving your blood sugars.  Follow-up with PCP in 1 month.   Hypoglycemia or low blood sugar:   Low blood sugar can happen quickly and may become an emergency if not treated right away.   While this shouldn't happen often, it can be brought upon if you skip a meal or do not eat enough. Also, if your insulin or other diabetes medications are dosed too high, this can cause your blood sugar to go to low.   Warning signs of low blood sugar include: Feeling shaky or dizzy Feeling weak or tired  Excessive hunger  Feeling anxious or upset  Sweating even when you aren't exercising  What to do if I experience low blood sugar? Follow the Rule of 15 Check your blood sugar. If lower than 70, proceed to step 2.  Treat with 15 grams of fast acting carbs which is found in 3-4 glucose tablets. If none are available you can try hard candy, 1 tablespoon of sugar or honey,4 ounces of  fruit juice, or 6 ounces of REGULAR soda.  Re-check your sugar in 15 minutes. If it is still below 70, do what you did in step 2 again. If your blood sugar has come back up, go ahead and eat a snack or small meal made up of complex carbs (ex. Whole grains) and protein at this time to avoid recurrence of low blood sugar.

## 2022-12-12 NOTE — Progress Notes (Signed)
S:    Chief Complaint  Patient presents with   Medication Management    T2DM/CGM   52 y.o. female who presents for diabetes evaluation, education, and management.  PMH is significant for HTN and T2DM with a recent admission for DKA. Patient was referred and last seen by Primary Care Provider, Dr. Marisue Humble, on 12/06/2022.   At last visit, Mounjaro (tirzepatide) 10 mg weekly was restarted.   Today, patient arrives in good spirits and presents with her friend, Adolph Pollack.   Current diabetes medications include: Lantus (insulin glargine) 20 units QPM, Novolog (insulin aspart) 5 units TID, Mounjaro (tirzepatide) 10 mg weekly  Current hypertension medications include: losartan 50 mg daily Current hyperlipidemia medications include: rosuvastatin 20 mg daily   Patient reports adherence to taking all medications as prescribed.   Insurance coverage: Medicare + Medicaid  Patient reports hypoglycemic events. 67 a few nights ago.   Reported home fasting blood sugars: 180  Reported 2 hour post-meal/random blood sugars: 180-200s.  Patient reports nocturia (nighttime urination).  Patient denies neuropathy (nerve pain). Patient denies visual changes. Patient reports self foot exams.    O:  Review of Systems  All other systems reviewed and are negative.   Physical Exam Constitutional:      Appearance: Normal appearance.  Pulmonary:     Effort: Pulmonary effort is normal.  Neurological:     Mental Status: She is alert.  Psychiatric:        Mood and Affect: Mood normal.        Behavior: Behavior normal.        Thought Content: Thought content normal.     Lab Results  Component Value Date   HGBA1C 10.5 (H) 11/19/2022   Vitals:   12/12/22 1426  BP: 116/74  Pulse: 86  SpO2: 100%    Lipid Panel     Component Value Date/Time   CHOL 179 09/28/2020 1128   TRIG 87 09/28/2020 1128   HDL 65 09/28/2020 1128   CHOLHDL 2.8 09/28/2020 1128   CHOLHDL 4.1 10/26/2014 1109   VLDL  14 10/26/2014 1109   LDLCALC 98 09/28/2020 1128    Clinical Atherosclerotic Cardiovascular Disease (ASCVD): No  The 10-year ASCVD risk score (Arnett DK, et al., 2019) is: 5.3%   Values used to calculate the score:     Age: 19 years     Sex: Female     Is Non-Hispanic African American: Yes     Diabetic: Yes     Tobacco smoker: No     Systolic Blood Pressure: 116 mmHg     Is BP treated: Yes     HDL Cholesterol: 65 mg/dL     Total Cholesterol: 179 mg/dL    A/P: Diabetes longstanding, currently uncontrolled based on A1c. Patient is able to verbalize appropriate hypoglycemia management plan. Medication adherence appears appropriate. Attempted to switch patient from Dexcom G6 to Dexcom G7 due to ease of use; however, patient did not have compatible phone for Dexcom G7 app.  -Continued basal insulin Lantus (insulin glargine)  -Continued rapid insulin Novolog (insulin aspart) 5 units TID.  -Continued GLP-1 Mounjaro (tirzepatide) 10 mg weekly.  -Placed Dexcom G6 sensor and transmitter then paired with receiver. Educated patient on proper use and how to place sensor.  -Refilled pen needles and test strips.  -Extensively discussed pathophysiology of diabetes, recommended lifestyle interventions, dietary effects on blood sugar control.  -Counseled on s/sx of and management of hypoglycemia.  -Next A1c anticipated July 2024.  Hypertension longstanding, currently controlled on current regimen. Blood pressure goal of <130/80 mmHg. Medication adherence reported.  -Continued losartan 50 mg.  Written patient instructions provided. Patient verbalized understanding of treatment plan.  Total time in face to face counseling 45 minutes.    Follow-up:  Pharmacist PRN. PCP clinic visit in 1 month.  Patient seen with Bing Plume, PharmD Candidate and Valeda Malm, PharmD, PGY2 Pharmacy Resident.

## 2023-01-14 ENCOUNTER — Ambulatory Visit: Payer: Self-pay | Admitting: Student

## 2023-01-31 ENCOUNTER — Other Ambulatory Visit: Payer: Self-pay

## 2023-02-01 MED ORDER — LOSARTAN POTASSIUM 50 MG PO TABS: ORAL_TABLET | ORAL | 1 refills | Status: DC

## 2023-02-04 ENCOUNTER — Other Ambulatory Visit: Payer: Self-pay

## 2023-02-04 MED ORDER — INSULIN GLARGINE 100 UNIT/ML SOLOSTAR PEN: 20.0000 [IU] | PEN_INJECTOR | Freq: Every day | SUBCUTANEOUS | 0 refills | Status: DC

## 2023-02-04 MED ORDER — INSULIN ASPART 100 UNIT/ML FLEXPEN: 5.0000 [IU] | PEN_INJECTOR | Freq: Three times a day (TID) | SUBCUTANEOUS | 0 refills | Status: DC

## 2023-04-14 ENCOUNTER — Other Ambulatory Visit: Payer: Self-pay | Admitting: Student

## 2023-05-20 ENCOUNTER — Other Ambulatory Visit: Payer: Self-pay | Admitting: Student

## 2023-05-26 ENCOUNTER — Encounter: Payer: Self-pay | Admitting: Emergency Medicine

## 2023-05-26 ENCOUNTER — Ambulatory Visit
Admission: EM | Admit: 2023-05-26 | Discharge: 2023-05-26 | Disposition: A | Discharge status: Home / Self Care | Payer: Medicare HMO | Attending: Internal Medicine | Admitting: Internal Medicine

## 2023-05-26 ENCOUNTER — Other Ambulatory Visit: Payer: Self-pay | Admitting: Student

## 2023-05-26 ENCOUNTER — Other Ambulatory Visit: Payer: Self-pay

## 2023-05-26 DIAGNOSIS — L509 Urticaria, unspecified: Secondary | ICD-10-CM

## 2023-05-26 DIAGNOSIS — E119 Type 2 diabetes mellitus without complications: Secondary | ICD-10-CM

## 2023-05-26 LAB — POCT FASTING CBG KUC MANUAL ENTRY: POCT Glucose (KUC): 181 mg/dL — AB (ref 70–99)

## 2023-05-26 MED ORDER — HYDROXYZINE HCL 25 MG PO TABS: 12.5000 mg | ORAL_TABLET | Freq: Three times a day (TID) | ORAL | 0 refills | Status: DC | PRN

## 2023-05-26 NOTE — ED Triage Notes (Signed)
Pt c/o hives all over her body x 5 days. She has tried cortisone cream and benadryl OTC.

## 2023-05-26 NOTE — ED Provider Notes (Signed)
[facility]  Note:  This document was prepared using Conservation officer, historic buildings and may include unintentional dictation errors.  MRN: [id number] DOB: Sep 02, 1970  Subjective:   Cathy Bond is a 52 y.o. female presenting for 5-day history of acute onset persistent itching and intermittent hives.  Has a history of random urticaria.  She is a type II diabetic treated with insulin, is noncompliant.  Patient ate a 12:00-13:00pm.  Has previously had diabetic ketoacidosis.  No facial swelling, oral swelling, difficulty with her breathing.  No current facility-administered medications for this encounter.  Current Outpatient Medications:    Blood Glucose Monitoring Suppl (BLOOD GLUCOSE MONITOR SYSTEM) w/Device KIT, Use to test blood sugar 3 (three) times daily., Disp: 1 kit, Rfl: 0   cetirizine (ZYRTEC) 10 MG tablet, TAKE 1 TABLET(10 MG) BY MOUTH DAILY, Disp: 90 tablet, Rfl: 3   Continuous Glucose Sensor (DEXCOM G6 SENSOR) MISC, 1 each by Does not apply route as needed., Disp: 3 each, Rfl: 2   Continuous Glucose Transmitter (DEXCOM G6 TRANSMITTER) MISC, 1 each by Does not apply route as needed., Disp: 1 each, Rfl: 0   diclofenac Sodium (VOLTAREN) 1 % GEL, Apply 2 g topically 4 (four) times daily. (Patient not taking: Reported on 10/02/2022), Disp: 150 g, Rfl: 2   EPINEPHrine 0.3 mg/0.3 mL IJ SOAJ injection, Inject 0.3 mg into the muscle as needed for anaphylaxis., Disp: 1 each, Rfl: 1   Glucose Blood (BLOOD GLUCOSE TEST STRIPS) STRP, Use to test blood sugar 3 (three) times daily., Disp: 100 each, Rfl: 0   glucose blood test strip, Use to check blood sugar 4 times daily., Disp: 100 each, Rfl: 12   Insulin Pen Needle (PEN NEEDLES) 31G X 5 MM MISC, Use to inject insulin four times daily., Disp: 100 each, Rfl: 11   Lancet Device MISC, Use 3 times daily as directed, Disp: 1 each, Rfl: 0   Lancets (FREESTYLE) lancets, Use as instructed to test blood glucose twice daily,  Disp: 100 each, Rfl: 12   Lancets MISC, Use as directed to check blood sugar 3 times daily., Disp: 100 each, Rfl: 0   LANTUS SOLOSTAR 100 UNIT/ML Solostar Pen, INJECT 20 UNITS UNDER THE SKIN EVERY DAY, Disp: 15 mL, Rfl: 0   losartan (COZAAR) 50 MG tablet, TAKE 1 TABLET(50 MG) BY MOUTH DAILY, Disp: 90 tablet, Rfl: 1   mometasone (NASONEX) 50 MCG/ACT nasal spray, Place 2 sprays into the nose daily., Disp: 1 each, Rfl: 12   Multiple Vitamin (MULITIVITAMIN WITH MINERALS) TABS, Take 1 tablet by mouth daily., Disp: , Rfl:    Multiple Vitamins-Minerals (HAIR/SKIN/NAILS PO), Take 2 tablets by mouth 2 (two) times daily., Disp: , Rfl:    nitroGLYCERIN (NITROSTAT) 0.3 MG SL tablet, Place 1 tablet (0.3 mg total) under the tongue every 5 (five) minutes as needed for chest pain., Disp: 10 tablet, Rfl: 0   NOVOLOG FLEXPEN 100 UNIT/ML FlexPen, INJECT 5 UNITS THREE TIMES DAILY WITH MEALS ONLY IF EATING A MEAL AND BLOOD GLUCOSE IS 80 OR HIGHER, Disp: 15 mL, Rfl: 0   polyethylene glycol powder (GLYCOLAX/MIRALAX) 17 GM/SCOOP powder, Take 17 g by mouth daily., Disp: 850 g, Rfl: 0   rosuvastatin (CRESTOR) 20 MG tablet, TAKE 1 TABLET(20 MG) BY MOUTH DAILY, Disp: 90 tablet, Rfl: 3   tirzepatide (MOUNJARO) 10 MG/0.5ML Pen, Inject 10 mg into the skin once a week., Disp: 6 mL, Rfl: 1   Allergies  Allergen Reactions   Jardiance [Empagliflozin]  DKA   Apple Juice Hives and Itching    Patient allergic to applesauce, but not raw apples   Shellfish Allergy Hives, Itching and Swelling    Denies airway involvement   Wellbutrin [Bupropion Hcl] Nausea Only    Significant   Metformin And Related Other (See Comments)    Itching, Dry Skin, patient preference to stop as this happened with only 500mg  daily.     Past Medical History:  Diagnosis Date   Anxiety    BV (bacterial vaginosis)    Depression    Diabetes mellitus    Hypertension    Seasonal allergies    Trichomonas    Yeast vaginitis      Past Surgical  History:  Procedure Laterality Date   endometrial biopsy  2 in 2013   negative   toe nail     TUBAL LIGATION      Family History  Problem Relation Age of Onset   Diabetes Mother    Stroke Mother    Cancer Father    Depression Sister    Alcohol abuse Sister    Diabetes Sister    Depression Brother    Diabetes Brother     Social History   Tobacco Use   Smoking status: Former    Current packs/day: 0.00    Average packs/day: 1 pack/day for 6.0 years (6.0 ttl pk-yrs)    Types: Cigarettes    Start date: 01/20/2008    Quit date: 01/19/2014    Years since quitting: 9.3   Smokeless tobacco: Never   Tobacco comments:    No plans to restart.  Substance Use Topics   Alcohol use: No   Drug use: No    Comment: No hx of IV use    ROS   Objective:   Vitals: BP 118/83 (BP Location: Left Arm)   Pulse 90   Temp 98.7 F (37.1 C) (Oral)   Resp 18   SpO2 98%   Physical Exam Constitutional:      General: She is not in acute distress.    Appearance: Normal appearance. She is well-developed. She is not ill-appearing, toxic-appearing or diaphoretic.  HENT:     Head: Normocephalic and atraumatic.     Right Ear: External ear normal.     Left Ear: External ear normal.     Nose: Nose normal.     Mouth/Throat:     Mouth: Mucous membranes are moist.     Pharynx: No pharyngeal swelling, oropharyngeal exudate, posterior oropharyngeal erythema or uvula swelling.     Tonsils: No tonsillar exudate or tonsillar abscesses. 0 on the right. 0 on the left.  Eyes:     General: No scleral icterus.       Right eye: No discharge.        Left eye: No discharge.     Extraocular Movements: Extraocular movements intact.  Cardiovascular:     Rate and Rhythm: Normal rate and regular rhythm.     Heart sounds: Normal heart sounds. No murmur heard.    No friction rub. No gallop.  Pulmonary:     Effort: Pulmonary effort is normal. No respiratory distress.     Breath sounds: No stridor. No wheezing,  rhonchi or rales.  Chest:     Chest wall: No tenderness.  Skin:    General: Skin is warm and dry.     Findings: Rash (patches of urticarial lesions scattered over her upper extremities, torso sparing the face and lower extremities) present.  Neurological:  General: No focal deficit present.     Mental Status: She is alert and oriented to person, place, and time.  Psychiatric:        Mood and Affect: Mood normal.        Behavior: Behavior normal.    Results for orders placed or performed during the hospital encounter of 05/26/23 (from the past 24 hour(s))  POCT CBG (manual entry)     Status: Abnormal   Collection Time: 05/26/23  4:33 PM  Result Value Ref Range   POCT Glucose ([facility]) 181 (A) 70 - 99 mg/dL   Assessment and Plan :   PDMP not reviewed this encounter.  1. Urticaria    Due to her noncompliance, hyperglycemia recommend avoiding steroids.  Avoid new exposures.  Use hydroxyzine, antihistamines.  Counseled patient on potential for adverse effects with medications prescribed/recommended today, ER and return-to-clinic precautions discussed, patient verbalized understanding.    Wallis Bamberg, [place] 05/26/23 1649

## 2023-05-26 NOTE — Discharge Instructions (Addendum)
Take 1/2 tablet to a full tablet of hydroxyzine for your hives. Avoid anything new. If hydroxyzine makes you too sleepy then switch to just Zyrtec (10mg ) and Claritin (10mg ) once daily.

## 2023-05-27 ENCOUNTER — Telehealth: Payer: Self-pay

## 2023-05-27 ENCOUNTER — Emergency Department (HOSPITAL_BASED_OUTPATIENT_CLINIC_OR_DEPARTMENT_OTHER)
Admission: EM | Admit: 2023-05-27 | Discharge: 2023-05-27 | Disposition: A | Discharge status: Home / Self Care | Payer: Medicare HMO | Attending: Emergency Medicine | Admitting: Emergency Medicine

## 2023-05-27 ENCOUNTER — Other Ambulatory Visit: Payer: Self-pay

## 2023-05-27 ENCOUNTER — Encounter (HOSPITAL_BASED_OUTPATIENT_CLINIC_OR_DEPARTMENT_OTHER): Payer: Self-pay | Admitting: Emergency Medicine

## 2023-05-27 ENCOUNTER — Emergency Department (HOSPITAL_BASED_OUTPATIENT_CLINIC_OR_DEPARTMENT_OTHER): Payer: Medicare HMO

## 2023-05-27 DIAGNOSIS — Z7984 Long term (current) use of oral hypoglycemic drugs: Secondary | ICD-10-CM | POA: Diagnosis not present

## 2023-05-27 DIAGNOSIS — Z79899 Other long term (current) drug therapy: Secondary | ICD-10-CM | POA: Diagnosis not present

## 2023-05-27 DIAGNOSIS — E119 Type 2 diabetes mellitus without complications: Secondary | ICD-10-CM | POA: Diagnosis not present

## 2023-05-27 DIAGNOSIS — I10 Essential (primary) hypertension: Secondary | ICD-10-CM | POA: Diagnosis not present

## 2023-05-27 DIAGNOSIS — R0789 Other chest pain: Secondary | ICD-10-CM | POA: Diagnosis not present

## 2023-05-27 DIAGNOSIS — Z794 Long term (current) use of insulin: Secondary | ICD-10-CM | POA: Diagnosis not present

## 2023-05-27 LAB — BASIC METABOLIC PANEL
Anion gap: 5 (ref 5–15)
BUN: 14 mg/dL (ref 6–20)
CO2: 32 mmol/L (ref 22–32)
Calcium: 9.6 mg/dL (ref 8.9–10.3)
Chloride: 103 mmol/L (ref 98–111)
Creatinine, Ser: 0.89 mg/dL (ref 0.44–1.00)
GFR, Estimated: >60 mL/min (ref >60)
Glucose, Bld: 152 mg/dL — ABNORMAL HIGH (ref 70–99)
Potassium: 3.6 mmol/L (ref 3.5–5.1)
Sodium: 140 mmol/L (ref 135–145)

## 2023-05-27 LAB — CBC
HCT: 35.2 % — ABNORMAL LOW (ref 36.0–46.0)
Hemoglobin: 12.2 g/dL (ref 12.0–15.0)
MCH: 27.8 pg (ref 26.0–34.0)
MCHC: 34.7 g/dL (ref 30.0–36.0)
MCV: 80.2 fL (ref 80.0–100.0)
Platelets: 224 10*3/uL (ref 150–400)
RBC: 4.39 MIL/uL (ref 3.87–5.11)
RDW: 12.7 % (ref 11.5–15.5)
WBC: 4 10*3/uL (ref 4.0–10.5)
nRBC: 0 % (ref 0.0–0.2)

## 2023-05-27 LAB — TROPONIN I (HIGH SENSITIVITY)
Troponin I (High Sensitivity): <2 ng/L (ref <18)
Troponin I (High Sensitivity): <2 ng/L (ref <18)

## 2023-05-27 MED ORDER — ALUM & MAG HYDROXIDE-SIMETH 200-200-20 MG/5ML PO SUSP
15.0000 mL | Freq: Once | ORAL | Status: AC
  Administered 2023-05-27: 15 mL via ORAL
  Filled 2023-05-27: qty 30

## 2023-05-27 MED ORDER — FAMOTIDINE IN NACL 20-0.9 MG/50ML-% IV SOLN
20.0000 mg | Freq: Once | INTRAVENOUS | Status: AC
  Administered 2023-05-27: 20 mg via INTRAVENOUS
  Filled 2023-05-27: qty 50

## 2023-05-27 NOTE — Telephone Encounter (Signed)
Patient call returned.  She is not a candidate for steroids.  If she is not responding to hydroxyzine or Zyrtec, recommend evaluation and treatment through the emergency room.

## 2023-05-27 NOTE — Telephone Encounter (Signed)
Patient called wanting to know if she can continue taking her otc Zyrtec with the hydrOXYzine that was prescribed yesterday.

## 2023-05-27 NOTE — ED Triage Notes (Signed)
Was seen yesterday for rash that started 1 week ago , was prescribe steroids . Woke up today with chest tightness , called her PCP , sent her here for further evaluation for both the e rash and chest pain per pt,

## 2023-05-27 NOTE — ED Notes (Signed)
Pt alert and oriented X 4 at the time of discharge. RR even and unlabored. No acute distress noted. Pt verbalized understanding of discharge instructions as discussed. Pt ambulatory to lobby at time of discharge.

## 2023-05-27 NOTE — Discharge Instructions (Addendum)
It was a pleasure caring for you today.  Workup was reassuring.  Please follow-up with your primary care provider.  Seek emergency care if experiencing any new or worsening symptoms.

## 2023-05-27 NOTE — ED Provider Notes (Signed)
[place] [facility] AT [facility] Provider Note   CSN: [id number] Arrival date & time: 05/27/23  1301     History {Add pertinent medical, surgical, social history, OB history to HPI:1} Chief Complaint  Patient presents with   Chest Pain    Naphtali HILARY MILKS is a 52 y.o. female with PMHx anxiety, DM, HTN who presents to ED concerned for. Patient received hydroxizine yesterday from UC for a rash. Woke up today with chest tightness.   Denies fever, dyspnea, cough, nausea, vomiting, diarrhea, abdominal pain, dysuria, hematuria, hematochezia. Denies recent surgery/immobilization, hx DVT/PE, hemoptysis, hx cancer in the past 6 months, calf swelling/tenderness.     Chest Pain      Home Medications Prior to Admission medications   Medication Sig Start Date End Date Taking? Authorizing Provider  Blood Glucose Monitoring Suppl (BLOOD GLUCOSE MONITOR SYSTEM) w/Device KIT Use to test blood sugar 3 (three) times daily. 11/21/22   Marinda Elk, MD  cetirizine (ZYRTEC) 10 MG tablet TAKE 1 TABLET(10 MG) BY MOUTH DAILY 10/21/22   [name], MD  Continuous Glucose Sensor (DEXCOM G6 SENSOR) MISC 1 each by Does not apply route as needed. 12/12/22   McDiarmid, Leighton Roach, MD  Continuous Glucose Transmitter (DEXCOM G6 TRANSMITTER) MISC 1 each by Does not apply route as needed. 12/12/22   McDiarmid, Leighton Roach, MD  diclofenac Sodium (VOLTAREN) 1 % GEL Apply 2 g topically 4 (four) times daily. Patient not taking: Reported on 10/02/2022 06/05/22   [name], MD  EPINEPHrine 0.3 mg/0.3 mL IJ SOAJ injection Inject 0.3 mg into the muscle as needed for anaphylaxis. 06/05/22   [name], MD  Glucose Blood (BLOOD GLUCOSE TEST STRIPS) STRP Use to test blood sugar 3 (three) times daily. 11/21/22   Marinda Elk, MD  glucose blood test strip Use to check blood sugar 4 times daily. 12/12/22   McDiarmid, Leighton Roach, MD  hydrOXYzine (ATARAX) 25 MG tablet Take 0.5-1 tablets  (12.5-25 mg total) by mouth every 8 (eight) hours as needed for itching. 05/26/23   Wallis Bamberg, PA-C  Insulin Pen Needle (PEN NEEDLES) 31G X 5 MM MISC Use to inject insulin four times daily. 12/12/22   McDiarmid, Leighton Roach, MD  Lancet Device MISC Use 3 times daily as directed 11/21/22   Marinda Elk, MD  Lancets (FREESTYLE) lancets Use as instructed to test blood glucose twice daily 09/03/21   Moses Manners, MD  Lancets MISC Use as directed to check blood sugar 3 times daily. 11/21/22   Marinda Elk, MD  LANTUS SOLOSTAR 100 UNIT/ML Solostar Pen INJECT 20 UNITS UNDER THE SKIN EVERY DAY 04/15/23   [name], MD  losartan (COZAAR) 50 MG tablet TAKE 1 TABLET(50 MG) BY MOUTH DAILY 02/01/23   [name], MD  mometasone (NASONEX) 50 MCG/ACT nasal spray Place 2 sprays into the nose daily. 09/24/21   Moses Manners, MD  Multiple Vitamin (MULITIVITAMIN WITH MINERALS) TABS Take 1 tablet by mouth daily.    [provider]  Multiple Vitamins-Minerals (HAIR/SKIN/NAILS PO) Take 2 tablets by mouth 2 (two) times daily.    [provider]  nitroGLYCERIN (NITROSTAT) 0.3 MG SL tablet Place 1 tablet (0.3 mg total) under the tongue every 5 (five) minutes as needed for chest pain. 09/27/19   Moses Manners, MD  NOVOLOG FLEXPEN 100 UNIT/ML FlexPen INJECT 5 UNITS THREE TIMES DAILY WITH MEALS ONLY IF EATING A MEAL AND BLOOD GLUCOSE IS  80 OR HIGHER 05/22/23   [name], MD  polyethylene glycol powder (GLYCOLAX/MIRALAX) 17 GM/SCOOP powder Take 17 g by mouth daily. 09/26/22   Maury Dus, MD  rosuvastatin (CRESTOR) 20 MG tablet TAKE 1 TABLET(20 MG) BY MOUTH DAILY 10/30/22   [name], MD  tirzepatide [facility]) 10 MG/0.5ML Pen Inject 10 mg into the skin once a week. 12/06/22   [name], MD      Allergies    Jardiance [empagliflozin], Apple juice, Shellfish allergy, Wellbutrin [bupropion hcl], and Metformin and related    Review of Systems   Review of Systems   Cardiovascular:  Positive for chest pain.    Physical Exam Updated Vital Signs BP 121/77 (BP Location: Right Arm)   Pulse 90   Temp 98.2 F (36.8 C) (Oral)   Resp 16   Wt 81.6 kg   LMP 03/25/2012   SpO2 98%   BMI 30.90 kg/m  Physical Exam Vitals and nursing note reviewed.  Constitutional:      General: She is not in acute distress.    Appearance: She is not ill-appearing or toxic-appearing.  HENT:     Head: Normocephalic and atraumatic.     Mouth/Throat:     Mouth: Mucous membranes are moist.     Pharynx: No oropharyngeal exudate or posterior oropharyngeal erythema.  Eyes:     General: No scleral icterus.       Right eye: No discharge.        Left eye: No discharge.     Conjunctiva/sclera: Conjunctivae normal.  Cardiovascular:     Rate and Rhythm: Normal rate and regular rhythm.     Pulses: Normal pulses.          Radial pulses are 2+ on the right side and 2+ on the left side.     Heart sounds: Normal heart sounds. No murmur heard. Pulmonary:     Effort: Pulmonary effort is normal. No respiratory distress.     Breath sounds: Normal breath sounds. No wheezing, rhonchi or rales.  Abdominal:     General: Bowel sounds are normal.     Palpations: Abdomen is soft. There is no mass.     Tenderness: There is no abdominal tenderness.  Musculoskeletal:     Right lower leg: No edema.     Left lower leg: No edema.     Comments: No calf tenderness to palpation  Skin:    General: Skin is warm and dry.     Capillary Refill: Capillary refill takes less than 2 seconds.     Findings: No rash.  Neurological:     General: No focal deficit present.     Mental Status: She is alert. Mental status is at baseline.  Psychiatric:        Mood and Affect: Mood normal.        Behavior: Behavior normal.     ED Results / Procedures / Treatments   Labs (all labs ordered are listed, but only abnormal results are displayed) Labs Reviewed  BASIC METABOLIC PANEL - Abnormal; Notable for  the following components:      Result Value   Glucose, Bld 152 (*)    All other components within normal limits  CBC - Abnormal; Notable for the following components:   HCT 35.2 (*)    All other components within normal limits  PREGNANCY, URINE  TROPONIN I (HIGH SENSITIVITY)  TROPONIN I (HIGH SENSITIVITY)    EKG EKG Interpretation Date/Time:  Tuesday May 27 2023 13:15:42 EST Ventricular Rate:  88 PR Interval:  155 QRS Duration:  90 QT Interval:  364 QTC Calculation: 441 R Axis:   93  Text Interpretation: Sinus rhythm Borderline right axis deviation Low voltage, precordial leads Borderline T abnormalities, anterior leads when compard to prior, slightly slower rate.  No STEMI Confirmed by Theda Belfast ([id number]) on 05/27/2023 3:08:36 PM  Radiology DG Chest 2 View  Result Date: 05/27/2023 CLINICAL DATA:  Chest pain and tightness EXAM: CHEST - 2 VIEW COMPARISON:  08/01/2021 FINDINGS: Cardiomediastinal silhouette and pulmonary vasculature are within normal limits. Lungs are clear. IMPRESSION: No acute cardiopulmonary process. Electronically Signed   By: Acquanetta Belling M.D.   On: 05/27/2023 15:52    Procedures Procedures  {Document cardiac monitor, telemetry assessment procedure when appropriate:1}  Medications Ordered in ED Medications - No data to display  ED Course/ Medical Decision Making/ A&P   {   Click here for ABCD2, HEART and other calculatorsREFRESH Note before signing :1}                              Medical Decision Making Amount and/or Complexity of Data Reviewed Labs: ordered. Radiology: ordered.  Risk OTC drugs. Prescription drug management.   ***  {Document critical care time when appropriate:1} {Document review of labs and clinical decision tools ie heart score, Chads2Vasc2 etc:1}  {Document your independent review of radiology images, and any outside records:1} {Document your discussion with family members, caretakers, and with  consultants:1} {Document social determinants of health affecting pt's care:1} {Document your decision making why or why not admission, treatments were needed:1} Final Clinical Impression(s) / ED Diagnoses Final diagnoses:  None    Rx / DC Orders ED Discharge Orders     None

## 2023-05-28 ENCOUNTER — Other Ambulatory Visit: Payer: Self-pay | Admitting: Family Medicine

## 2023-05-28 DIAGNOSIS — E119 Type 2 diabetes mellitus without complications: Secondary | ICD-10-CM

## 2023-05-29 ENCOUNTER — Emergency Department (HOSPITAL_COMMUNITY)
Admission: EM | Admit: 2023-05-29 | Discharge: 2023-05-29 | Disposition: A | Discharge status: Home / Self Care | Payer: Medicare HMO | Attending: Emergency Medicine | Admitting: Emergency Medicine

## 2023-05-29 ENCOUNTER — Encounter (HOSPITAL_COMMUNITY): Payer: Self-pay

## 2023-05-29 DIAGNOSIS — L509 Urticaria, unspecified: Secondary | ICD-10-CM | POA: Insufficient documentation

## 2023-05-29 MED ORDER — METHYLPREDNISOLONE SODIUM SUCC 125 MG IJ SOLR
125.0000 mg | Freq: Once | INTRAMUSCULAR | Status: AC
  Administered 2023-05-29: 125 mg via INTRAMUSCULAR
  Filled 2023-05-29: qty 2

## 2023-05-29 MED ORDER — PREDNISONE 10 MG (21) PO TBPK: ORAL_TABLET | Freq: Every day | ORAL | 0 refills | Status: DC

## 2023-05-29 NOTE — Discharge Instructions (Addendum)
I have sent prednisone into the pharmacy for you.  This will increase your blood sugar. While you are on the prednisone I recommend you increase your nightly insulin dose to 25 units.  For any concerning symptoms return to the emergency room otherwise follow-up with your PCP.

## 2023-05-29 NOTE — ED Triage Notes (Addendum)
Pt presents with c/o hives for 2 weeks. Pt reports she is unsure what she has had a reaction to. Pt reports she has been seen for same and placed on hydroxyzine. Pt reports today she woke up with some throat itching and and some coughing. Pt in NAD at this time.

## 2023-05-29 NOTE — ED Provider Notes (Signed)
[place] EMERGENCY DEPARTMENT AT [facility] Provider Note   CSN: [id number] Arrival date & time: 05/29/23  1028     History  Chief Complaint  Patient presents with   Urticaria    Cathy Bond is a 52 y.o. female.  52 year old female presents today for concern of urticaria.  Present for 2 weeks.  No throat swelling, difficulty swallowing, shortness of breath, or chest pain.  She has tried antihistamine without relief.  She is unsure what she is reacting to.  The history is provided by the patient. No language interpreter was used.       Home Medications Prior to Admission medications   Medication Sig Start Date End Date Taking? Authorizing Provider  predniSONE (STERAPRED UNI-PAK 21 TAB) 10 MG (21) TBPK tablet Take by mouth daily. Take 6 tabs by mouth daily  for 2 days, then 5 tabs for 2 days, then 4 tabs for 2 days, then 3 tabs for 2 days, 2 tabs for 2 days, then 1 tab by mouth daily for 2 days 05/29/23  Yes Karie Mainland, Aarianna Hoadley, PA-C  Blood Glucose Monitoring Suppl (BLOOD GLUCOSE MONITOR SYSTEM) w/Device KIT Use to test blood sugar 3 (three) times daily. 11/21/22   Marinda Elk, MD  cetirizine (ZYRTEC) 10 MG tablet TAKE 1 TABLET(10 MG) BY MOUTH DAILY 10/21/22   [name], MD  Continuous Glucose Sensor (DEXCOM G6 SENSOR) MISC 1 each by Does not apply route as needed. 12/12/22   McDiarmid, Leighton Roach, MD  Continuous Glucose Transmitter (DEXCOM G6 TRANSMITTER) MISC USE AS DIRECTED AS NEEDED 05/29/23   [name], MD  diclofenac Sodium (VOLTAREN) 1 % GEL Apply 2 g topically 4 (four) times daily. Patient not taking: Reported on 10/02/2022 06/05/22   [name], MD  EPINEPHrine 0.3 mg/0.3 mL IJ SOAJ injection Inject 0.3 mg into the muscle as needed for anaphylaxis. 06/05/22   [name], MD  Glucose Blood (BLOOD GLUCOSE TEST STRIPS) STRP Use to test blood sugar 3 (three) times daily. 11/21/22   Marinda Elk, MD  glucose blood test strip Use to check  blood sugar 4 times daily. 12/12/22   McDiarmid, Leighton Roach, MD  hydrOXYzine (ATARAX) 25 MG tablet Take 0.5-1 tablets (12.5-25 mg total) by mouth every 8 (eight) hours as needed for itching. 05/26/23   Wallis Bamberg, PA-C  Insulin Pen Needle (PEN NEEDLES) 31G X 5 MM MISC Use to inject insulin four times daily. 12/12/22   McDiarmid, Leighton Roach, MD  Lancet Device MISC Use 3 times daily as directed 11/21/22   Marinda Elk, MD  Lancets (FREESTYLE) lancets Use as instructed to test blood glucose twice daily 09/03/21   Moses Manners, MD  Lancets MISC Use as directed to check blood sugar 3 times daily. 11/21/22   Marinda Elk, MD  LANTUS SOLOSTAR 100 UNIT/ML Solostar Pen INJECT 20 UNITS UNDER THE SKIN EVERY DAY 04/15/23   [name], MD  losartan (COZAAR) 50 MG tablet TAKE 1 TABLET(50 MG) BY MOUTH DAILY 02/01/23   [name], MD  mometasone (NASONEX) 50 MCG/ACT nasal spray Place 2 sprays into the nose daily. 09/24/21   Moses Manners, MD  MOUNJARO 10 MG/0.5ML Pen ADMINISTER 10 MG UNDER THE SKIN 1 TIME A WEEK 05/29/23   [name], MD  Multiple Vitamin (MULITIVITAMIN WITH MINERALS) TABS Take 1 tablet by mouth daily.    [provider]  Multiple Vitamins-Minerals (HAIR/SKIN/NAILS PO) Take 2 tablets by  mouth 2 (two) times daily.    [provider]  nitroGLYCERIN (NITROSTAT) 0.3 MG SL tablet Place 1 tablet (0.3 mg total) under the tongue every 5 (five) minutes as needed for chest pain. 09/27/19   Moses Manners, MD  NOVOLOG FLEXPEN 100 UNIT/ML FlexPen INJECT 5 UNITS THREE TIMES DAILY WITH MEALS ONLY IF EATING A MEAL AND BLOOD GLUCOSE IS 80 OR HIGHER 05/22/23   [name], MD  polyethylene glycol powder (GLYCOLAX/MIRALAX) 17 GM/SCOOP powder Take 17 g by mouth daily. 09/26/22   Maury Dus, MD  rosuvastatin (CRESTOR) 20 MG tablet TAKE 1 TABLET(20 MG) BY MOUTH DAILY 10/30/22   [name], MD      Allergies    Jardiance [empagliflozin], Apple juice, Shellfish  allergy, Wellbutrin [bupropion hcl], and Metformin and related    Review of Systems   Review of Systems  Constitutional:  Negative for chills and fever.  HENT:  Negative for sore throat, trouble swallowing and voice change.   Respiratory:  Negative for shortness of breath.   Cardiovascular:  Negative for chest pain.  Skin:  Positive for rash.  Neurological:  Negative for light-headedness.  All other systems reviewed and are negative.   Physical Exam Updated Vital Signs BP (!) 144/87 (BP Location: Right Arm)   Pulse 89   Temp 98.2 F (36.8 C) (Oral)   Resp 16   LMP 03/25/2012   SpO2 100%  Physical Exam Vitals and nursing note reviewed.  Constitutional:      General: She is not in acute distress.    Appearance: Normal appearance. She is not ill-appearing.  HENT:     Head: Normocephalic and atraumatic.     Nose: Nose normal.     Mouth/Throat:     Mouth: Mucous membranes are moist.     Pharynx: Oropharynx is clear. No oropharyngeal exudate or posterior oropharyngeal erythema.  Eyes:     Conjunctiva/sclera: Conjunctivae normal.  Cardiovascular:     Rate and Rhythm: Normal rate and regular rhythm.  Pulmonary:     Effort: Pulmonary effort is normal. No respiratory distress.  Musculoskeletal:        General: No deformity. Normal range of motion.  Skin:    Findings: No rash.  Neurological:     Mental Status: She is alert.     ED Results / Procedures / Treatments   Labs (all labs ordered are listed, but only abnormal results are displayed) Labs Reviewed - No data to display  EKG None  Radiology DG Chest 2 View  Result Date: 05/27/2023 CLINICAL DATA:  Chest pain and tightness EXAM: CHEST - 2 VIEW COMPARISON:  08/01/2021 FINDINGS: Cardiomediastinal silhouette and pulmonary vasculature are within normal limits. Lungs are clear. IMPRESSION: No acute cardiopulmonary process. Electronically Signed   By: Acquanetta Belling M.D.   On: 05/27/2023 15:52    Procedures Procedures     Medications Ordered in ED Medications  methylPREDNISolone sodium succinate (SOLU-MEDROL) 125 mg/2 mL injection 125 mg (125 mg Intramuscular Given 05/29/23 1128)    ED Course/ Medical Decision Making/ A&P                                 Medical Decision Making Risk Prescription drug management.   52 year old female presents today for evaluation of generalized urticaria.  Has been taking her antihistamine without improvement.  Will give dose of Solu-Medrol and start on prednisone Dosepak.  Discussed she should increase  her long-acting insulin that she takes at bedtime to 25 units from 20 units while she is on prednisone.  Discussed close follow-up with her PCP.  Return precaution discussed.  Patient voices understanding and is in agreement with plan.  No evidence of anaphylaxis reaction at this time.   Final Clinical Impression(s) / ED Diagnoses Final diagnoses:  Urticaria    Rx / DC Orders ED Discharge Orders          Ordered    predniSONE (STERAPRED UNI-PAK 21 TAB) 10 MG (21) TBPK tablet  Daily        05/29/23 1135              Marita Kansas, PA-C 05/29/23 1145    Loetta Rough, MD 05/29/23 1324

## 2023-06-02 ENCOUNTER — Telehealth: Payer: Self-pay

## 2023-06-02 NOTE — Transitions of Care (Post Inpatient/ED Visit) (Unsigned)
   06/02/2023  Name: [name] MRN: [id number] DOB: 04-Jul-1971  Today's TOC FU Call Status: Today's TOC FU Call Status:: Unsuccessful Call (1st Attempt) Unsuccessful Call (1st Attempt) Date: 06/02/23  Attempted to reach the patient regarding the most recent Inpatient/ED visit.  Follow Up Plan: Additional outreach attempts will be made to reach the patient to complete the Transitions of Care (Post Inpatient/ED visit) call.   Signature Karena Addison, LPN [facility] Nurse Health Advisor Direct Dial [phone]

## 2023-06-03 NOTE — Transitions of Care (Post Inpatient/ED Visit) (Unsigned)
   06/03/2023  Name: KIONDRA FRACE MRN: [id number] DOB: [birth year]  Today's TOC FU Call Status: Today's TOC FU Call Status:: Unsuccessful Call (2nd Attempt) Unsuccessful Call (1st Attempt) Date: 06/02/23 Unsuccessful Call (2nd Attempt) Date: 06/03/23  Attempted to reach the patient regarding the most recent Inpatient/ED visit.  Follow Up Plan: Additional outreach attempts will be made to reach the patient to complete the Transitions of Care (Post Inpatient/ED visit) call.   Signature Karena Addison, LPN [facility] Nurse Health Advisor Direct Dial [phone]

## 2023-06-05 NOTE — Transitions of Care (Post Inpatient/ED Visit) (Signed)
   06/05/2023  Name: ZAMANI DENNY MRN: [id number] DOB: 08-07-1970  Today's TOC FU Call Status: Today's TOC FU Call Status:: Unsuccessful Call (3rd Attempt) Unsuccessful Call (1st Attempt) Date: 06/02/23 Unsuccessful Call (2nd Attempt) Date: 06/03/23 Unsuccessful Call (3rd Attempt) Date: 06/05/23  Attempted to reach the patient regarding the most recent Inpatient/ED visit.  Follow Up Plan: No further outreach attempts will be made at this time. We have been unable to contact the patient.  Signature Karena Addison, LPN [facility] Nurse Health Advisor Direct Dial [phone]

## 2023-06-09 ENCOUNTER — Encounter: Payer: Self-pay | Admitting: Student

## 2023-06-09 ENCOUNTER — Ambulatory Visit: Payer: Medicare HMO | Admitting: Student

## 2023-06-09 VITALS — BP 110/99 | HR 91 | Ht 64.0 in | Wt 186.4 lb

## 2023-06-09 DIAGNOSIS — I10 Essential (primary) hypertension: Secondary | ICD-10-CM | POA: Diagnosis not present

## 2023-06-09 DIAGNOSIS — E119 Type 2 diabetes mellitus without complications: Secondary | ICD-10-CM | POA: Diagnosis not present

## 2023-06-09 DIAGNOSIS — Z7985 Long-term (current) use of injectable non-insulin antidiabetic drugs: Secondary | ICD-10-CM

## 2023-06-09 DIAGNOSIS — L509 Urticaria, unspecified: Secondary | ICD-10-CM

## 2023-06-09 LAB — POCT GLYCOSYLATED HEMOGLOBIN (HGB A1C): HbA1c, POC (controlled diabetic range): 7.7 % — AB (ref 0.0–7.0)

## 2023-06-09 MED ORDER — FAMOTIDINE 20 MG PO TABS
20.0000 mg | ORAL_TABLET | Freq: Every day | ORAL | 0 refills | Status: DC
Start: 2023-06-09 — End: 2023-08-13

## 2023-06-09 MED ORDER — LANTUS SOLOSTAR 100 UNIT/ML ~~LOC~~ SOPN
20.0000 [IU] | PEN_INJECTOR | Freq: Every day | SUBCUTANEOUS | 0 refills | Status: DC
Start: 2023-06-09 — End: 2023-09-22

## 2023-06-09 MED ORDER — HYDROXYZINE HCL 25 MG PO TABS: 12.5000 mg | ORAL_TABLET | Freq: Three times a day (TID) | ORAL | 0 refills | Status: DC | PRN

## 2023-06-09 NOTE — Progress Notes (Unsigned)
    SUBJECTIVE:   CHIEF COMPLAINT / HPI:   Urticaria Seen in ED on 11/4 and 05/29/23 for generalized urticaria without any respiratory symptoms. She was given rx for atarax and 12 day prednisone taper. She is still itching, worse at night and the rash comes and goes. Atarax helps. Was confused by the prednisone taper and was concerned about her high blood sugars. She has been only been taking 10 mg prednisone for past 3 days and prior to that was not taking full doses as prescribed.  She has also been taking Zyrtec daily. Of note has rx for epinephrine but unsure what she is allergic to. Has had hives intermittently for years.  Cannot think of anything new she is been exposed to and cannot figure out what is causing the recurrent urticaria.  T2DM Currently taking mounjaro 10 mg injected weekly, Lantus and NovoLog Was told at last ED visit to increase lantus from 20 to 25 U which she has been doing  Taking novolog 6 U with meals while on prednisone (Rx is for 5 units with meals) After a meal blood sugar is in high 200's since taking prednisone which is concerning to her.  Fasting sugar before taking prednisone: 1-teens to 130's, low outliers in 80s Overall is feeling well today other than being anxious about the urticaria coming back  HTN Takes losartan 50 mg daily   PERTINENT  PMH / PSH: T2DM  OBJECTIVE:   BP (!) 110/99   Pulse 91   Ht 5\' 4"  (1.626 m)   Wt 186 lb 6.4 oz (84.6 kg)   LMP 03/25/2012   SpO2 99%   BMI 32.00 kg/m    General: NAD, pleasant, able to participate in exam Cardiac: RRR, no murmurs. Respiratory: CTAB, normal effort, No wheezes, rales or rhonchi Skin: warm and dry, minimal small scattered flat erythematous lesions on bilateral upper thighs which patient states are remnants of urticaria.  Otherwise normal skin exam Neuro: alert, no obvious focal deficits Psych: Anxious but appropriate for situation  ASSESSMENT/PLAN:   Urticaria Unclear cause, could be  environmental, autoimmune.  No urticaria currently.  Patient will need workup for this.  -Referral placed for allergist -has EpiPen if needed -D/C prednisone as patient is not taking it as prescribed and it is increasing her blood sugars -continue Zyrtec, add Pepcid 20 mg daily -Continue Atarax 12.5-25 mg every 8 hours as needed for itching  Type 2 diabetes mellitus without complication, without long-term current use of insulin (HCC) A1c significantly improved since April from 10.5 to 7.7 today. Blood sugars currently elevated in setting of taking prednisone.  No concern for DKA today. Took prednisone today, advised to take the Lantus 25 units tonight and then tomorrow get back to home regimen: Lantus 20 units daily, NovoLog 5 units with meals and continue Mounjaro 10 mg weekly Discussed and given handout on hypoglycemia management Return in 3 months for next A1c check  HTN (hypertension) Diastolic elevated to 99.  Will not change regimen at this time considering systolic is 110 and patient is anxious about urticaria -Continue losartan 50 mg daily     Dr. Erick Alley, DO [facility]

## 2023-06-09 NOTE — Patient Instructions (Addendum)
It was great to see you! Thank you for allowing me to participate in your care!  I recommend that you always bring your medications to each appointment as this makes it easy to ensure you are on the correct medications and helps [place] not miss when refills are needed.  Our plans for today:  - STOP taking prednisone. Your sugars should improve after stopping the steroid.  - I sent in prescriptions for Pepcid to take daily along with the zyrtec - take atarax as needed for itching -referral placed for an allergist, they will call to schedule apt.  - If you begin to feel sick or have difficulty breathing, return or go to the ED  Take care and seek immediate care sooner if you develop any concerns.   Dr. Erick Alley, DO [facility]

## 2023-06-11 NOTE — Assessment & Plan Note (Addendum)
A1c significantly improved since April from 10.5 to 7.7 today. Blood sugars currently elevated in setting of taking prednisone.  No concern for DKA today. Took prednisone today, advised to take the Lantus 25 units tonight and then tomorrow get back to home regimen: Lantus 20 units daily, NovoLog 5 units with meals and continue Mounjaro 10 mg weekly Discussed and given handout on hypoglycemia management Return in 3 months for next A1c check

## 2023-06-11 NOTE — Assessment & Plan Note (Signed)
Diastolic elevated to 99.  Will not change regimen at this time considering systolic is 110 and patient is anxious about urticaria -Continue losartan 50 mg daily

## 2023-06-11 NOTE — Assessment & Plan Note (Addendum)
Unclear cause, could be environmental, autoimmune.  No urticaria currently.  Patient will need workup for this.  -Referral placed for allergist -has EpiPen if needed -D/C prednisone as patient is not taking it as prescribed and it is increasing her blood sugars -continue Zyrtec, add Pepcid 20 mg daily -Continue Atarax 12.5-25 mg every 8 hours as needed for itching

## 2023-07-01 ENCOUNTER — Other Ambulatory Visit: Payer: Self-pay | Admitting: Student

## 2023-07-01 DIAGNOSIS — E119 Type 2 diabetes mellitus without complications: Secondary | ICD-10-CM

## 2023-07-31 ENCOUNTER — Other Ambulatory Visit (HOSPITAL_COMMUNITY): Payer: Self-pay

## 2023-08-13 ENCOUNTER — Other Ambulatory Visit: Payer: Self-pay

## 2023-08-13 ENCOUNTER — Other Ambulatory Visit: Payer: Self-pay | Admitting: Student

## 2023-08-13 DIAGNOSIS — L509 Urticaria, unspecified: Secondary | ICD-10-CM

## 2023-08-13 DIAGNOSIS — E119 Type 2 diabetes mellitus without complications: Secondary | ICD-10-CM

## 2023-08-14 ENCOUNTER — Other Ambulatory Visit: Payer: Self-pay | Admitting: Student

## 2023-08-14 DIAGNOSIS — L509 Urticaria, unspecified: Secondary | ICD-10-CM

## 2023-08-14 MED ORDER — FAMOTIDINE 20 MG PO TABS: 20.0000 mg | ORAL_TABLET | Freq: Every day | ORAL | 0 refills | Status: AC

## 2023-08-17 ENCOUNTER — Other Ambulatory Visit: Payer: Self-pay | Admitting: Student

## 2023-08-19 ENCOUNTER — Other Ambulatory Visit: Payer: Self-pay | Admitting: Student

## 2023-08-19 DIAGNOSIS — L509 Urticaria, unspecified: Secondary | ICD-10-CM

## 2023-08-24 ENCOUNTER — Emergency Department (HOSPITAL_COMMUNITY): Payer: 59

## 2023-08-24 ENCOUNTER — Other Ambulatory Visit: Payer: Self-pay

## 2023-08-24 ENCOUNTER — Encounter (HOSPITAL_COMMUNITY): Payer: Self-pay

## 2023-08-24 ENCOUNTER — Emergency Department (HOSPITAL_COMMUNITY)
Admission: EM | Admit: 2023-08-24 | Discharge: 2023-08-24 | Disposition: A | Discharge status: Home / Self Care | Payer: 59 | Attending: Emergency Medicine | Admitting: Emergency Medicine

## 2023-08-24 DIAGNOSIS — I10 Essential (primary) hypertension: Secondary | ICD-10-CM | POA: Insufficient documentation

## 2023-08-24 DIAGNOSIS — Z79899 Other long term (current) drug therapy: Secondary | ICD-10-CM | POA: Diagnosis not present

## 2023-08-24 DIAGNOSIS — M25562 Pain in left knee: Secondary | ICD-10-CM | POA: Insufficient documentation

## 2023-08-24 DIAGNOSIS — E119 Type 2 diabetes mellitus without complications: Secondary | ICD-10-CM | POA: Insufficient documentation

## 2023-08-24 DIAGNOSIS — L509 Urticaria, unspecified: Secondary | ICD-10-CM | POA: Insufficient documentation

## 2023-08-24 DIAGNOSIS — Z794 Long term (current) use of insulin: Secondary | ICD-10-CM | POA: Insufficient documentation

## 2023-08-24 MED ORDER — MELOXICAM 7.5 MG PO TABS: 7.5000 mg | ORAL_TABLET | Freq: Every day | ORAL | 0 refills | Status: DC

## 2023-08-24 NOTE — ED Triage Notes (Signed)
Patient reports she has a rash on her abdomen and back x 1 week. Also reports left knee swelling and pain x 2 days. Patient denies injury.

## 2023-08-24 NOTE — Discharge Instructions (Addendum)
Take the medication to help with the inflammation in your knee.  Continue your antihistamines to help with itching and hives.  Consider seeing an allergist for further evaluation of your recurrent hives and consider seeing an orthopedic doctor for your knee pain.  The mobic medication is for your knee pain

## 2023-08-24 NOTE — ED Provider Notes (Signed)
[facility] AT [facility] Provider Note   CSN: [id number] Arrival date & time: 08/24/23  1125     History  Chief Complaint  Patient presents with   Rash   Knee Pain    Cathy Bond is a 53 y.o. female.   Rash Knee Pain    Patient has a history of seasonal allergies depression diabetes hypertension who presents to the ED with complaints of left knee pain as well as recurrent urticaria.  Patient states she has had the symptoms on and off intermittently for few months.  Patient previously has been seen in the urgent care as well as the ED.  She also followed up with her primary care doctor.  Patient is not sure what is triggering the urticaria for her.  She does not recall any new foods detergents or medications.  She is not have any difficulty breathing.  She has noted a rash on her abdomen.  Patient has been taking antihistamines but states that the rash returns.  She also has had some left knee discomfort recently.  Patient feels like it is more swollen compared to the left.  She has not had any falls or injuries.  She does do a lot of walking and is not sure if that is exacerbating her symptoms  Home Medications Prior to Admission medications   Medication Sig Start Date End Date Taking? Authorizing Provider  Blood Glucose Monitoring Suppl (BLOOD GLUCOSE MONITOR SYSTEM) w/Device KIT Use to test blood sugar 3 (three) times daily. 11/21/22   Marinda Elk, MD  cetirizine (ZYRTEC) 10 MG tablet TAKE 1 TABLET(10 MG) BY MOUTH DAILY 10/21/22   [name], MD  Continuous Glucose Sensor (DEXCOM G6 SENSOR) MISC USE AS DIRECTED AS NEEDED 08/14/23   [name], MD  Continuous Glucose Transmitter (DEXCOM G6 TRANSMITTER) MISC USE AS DIRECTED AS NEEDED 05/29/23   [name], MD  EPINEPHrine 0.3 mg/0.3 mL IJ SOAJ injection Inject 0.3 mg into the muscle as needed for anaphylaxis. 06/05/22   [name], MD  famotidine (PEPCID) 20 MG tablet  Take 1 tablet (20 mg total) by mouth daily. 08/14/23   [name], MD  Glucose Blood (BLOOD GLUCOSE TEST STRIPS) STRP Use to test blood sugar 3 (three) times daily. 11/21/22   Marinda Elk, MD  glucose blood test strip Use to check blood sugar 4 times daily. 12/12/22   McDiarmid, Leighton Roach, MD  hydrOXYzine (ATARAX) 25 MG tablet TAKE 1/2 TO 1 TABLET(12.5 TO 25 MG) BY MOUTH EVERY 8 HOURS AS NEEDED FOR ITCHING 08/20/23   [name], MD  insulin glargine (LANTUS SOLOSTAR) 100 UNIT/ML Solostar Pen Inject 20 Units into the skin daily. 06/09/23   Erick Alley, DO  Insulin Pen Needle (PEN NEEDLES) 31G X 5 MM MISC Use to inject insulin four times daily. 12/12/22   McDiarmid, Leighton Roach, MD  Lancet Device MISC Use 3 times daily as directed 11/21/22   Marinda Elk, MD  Lancets (FREESTYLE) lancets Use as instructed to test blood glucose twice daily 09/03/21   Moses Manners, MD  Lancets MISC Use as directed to check blood sugar 3 times daily. 11/21/22   Marinda Elk, MD  losartan (COZAAR) 50 MG tablet TAKE 1 TABLET(50 MG) BY MOUTH DAILY 08/18/23   [name], MD  meloxicam (MOBIC) 7.5 MG tablet Take 1 tablet (7.5 mg total) by mouth daily. 08/24/23  Yes Linwood Dibbles, MD  mometasone (  NASONEX) 50 MCG/ACT nasal spray Place 2 sprays into the nose daily. 09/24/21   Moses Manners, MD  Multiple Vitamin (MULITIVITAMIN WITH MINERALS) TABS Take 1 tablet by mouth daily.    [provider]  Multiple Vitamins-Minerals (HAIR/SKIN/NAILS PO) Take 2 tablets by mouth 2 (two) times daily.    [provider]  nitroGLYCERIN (NITROSTAT) 0.3 MG SL tablet Place 1 tablet (0.3 mg total) under the tongue every 5 (five) minutes as needed for chest pain. 09/27/19   Moses Manners, MD  NOVOLOG FLEXPEN 100 UNIT/ML FlexPen INJECT 5 UNITS THREE TIMES DAILY WITH MEALS ONLY IF EATING A MEAL AND BLOOD GLUCOSE IS 80 OR HIGHER 05/22/23   [name], MD  polyethylene glycol powder (GLYCOLAX/MIRALAX) 17  GM/SCOOP powder Take 17 g by mouth daily. 09/26/22   Maury Dus, MD  predniSONE (STERAPRED UNI-PAK 21 TAB) 10 MG (21) TBPK tablet Take by mouth daily. Take 6 tabs by mouth daily  for 2 days, then 5 tabs for 2 days, then 4 tabs for 2 days, then 3 tabs for 2 days, 2 tabs for 2 days, then 1 tab by mouth daily for 2 days 05/29/23   Marita Kansas, PA-C  rosuvastatin (CRESTOR) 20 MG tablet TAKE 1 TABLET(20 MG) BY MOUTH DAILY 10/30/22   [name], MD  tirzepatide [facility]) 10 MG/0.5ML Pen ADMINISTER 10 MG UNDER THE SKIN 1 TIME A WEEK 07/01/23   [name], MD      Allergies    Jardiance [empagliflozin], Apple juice, Shellfish allergy, Wellbutrin [bupropion hcl], and Metformin and related    Review of Systems   Review of Systems  Skin:  Positive for rash.    Physical Exam Updated Vital Signs BP 132/83 (BP Location: Right Arm)   Pulse 90   Temp 98.3 F (36.8 C) (Oral)   Resp 16   Ht 1.626 m (5\' 4" )   Wt 84.4 kg   LMP 03/25/2012   SpO2 97%   BMI 31.93 kg/m  Physical Exam Vitals and nursing note reviewed.  Constitutional:      General: She is not in acute distress.    Appearance: She is well-developed.  HENT:     Head: Normocephalic and atraumatic.     Right Ear: External ear normal.     Left Ear: External ear normal.     Nose: No congestion.     Mouth/Throat:     Pharynx: No oropharyngeal exudate.     Comments: No evidence of oropharyngeal edema Eyes:     General: No scleral icterus.       Right eye: No discharge.        Left eye: No discharge.     Conjunctiva/sclera: Conjunctivae normal.  Neck:     Trachea: No tracheal deviation.     Comments: No lymphadenopathy noted, supple no Cardiovascular:     Rate and Rhythm: Normal rate.  Pulmonary:     Effort: Pulmonary effort is normal. No respiratory distress.     Breath sounds: No stridor.  Abdominal:     General: There is no distension.  Musculoskeletal:        General: No swelling or deformity.     Cervical  back: Neck supple. No rigidity.     Comments: mild tenderness palpation left knee, no effusion, no erythema, no l lower extremity swelling or tenderness other than around her knee  Skin:    General: Skin is warm and dry.     Findings: No rash.  Neurological:     Mental Status: She is alert. Mental status is at baseline.     Cranial Nerves: No dysarthria or facial asymmetry.     Motor: No seizure activity.     ED Results / Procedures / Treatments   Labs (all labs ordered are listed, but only abnormal results are displayed) Labs Reviewed - No data to display  EKG None  Radiology DG Knee Complete 4 Views Left Result Date: 08/24/2023 CLINICAL DATA:  53 year old female with history of left-sided knee swelling. EXAM: LEFT KNEE - COMPLETE 4+ VIEW COMPARISON:  No priors. FINDINGS: No evidence of fracture, dislocation, or joint effusion. No evidence of arthropathy or other focal bone abnormality. Soft tissues are unremarkable. IMPRESSION: Negative. Electronically Signed   By: Trudie Reed M.D.   On: 08/24/2023 13:35    Procedures Procedures    Medications Ordered in ED Medications - No data to display  ED Course/ Medical Decision Making/ A&P                                 Medical Decision Making Amount and/or Complexity of Data Reviewed Radiology: ordered.  Risk Prescription drug management.   Patient is complaining of intermittent urticaria.  She does have have some in her abdomen right now.  No evidence of any airway compromise.  Patient is taking antihistamines.  Patient had several antihistamines in her purse.  Discussed only taking one of them at a time.  She is also taking Pepcid which is reasonable.  With her history of diabetes we will hold off on any steroids.  Recommend outpatient follow-up with an allergist to try and determine the cause of her hives  Patient also complaining to left knee pain.  X-rays did not show any signs of fracture or other acute abnormality.   Patient does not have any findings to suggest acute infection.  No effusion appreciated on exam.  This possibly could be related to her recent exercise.  Will give her a knee sleeve.  Recommend NSAIDs for short-term relief.  Outpatient follow-up with orthopedics for further evaluation  Evaluation and diagnostic testing in the emergency department does not suggest an emergent condition requiring admission or immediate intervention beyond what has been performed at this time.  The patient is safe for discharge and has been instructed to return immediately for worsening symptoms, change in symptoms or any other concerns.        Final Clinical Impression(s) / ED Diagnoses Final diagnoses:  Urticaria  Acute pain of left knee    Rx / DC Orders ED Discharge Orders          Ordered    meloxicam (MOBIC) 7.5 MG tablet  Daily        08/24/23 1411              Linwood Dibbles, MD 08/24/23 1416

## 2023-08-28 ENCOUNTER — Other Ambulatory Visit: Payer: Self-pay | Admitting: Student

## 2023-08-28 DIAGNOSIS — L509 Urticaria, unspecified: Secondary | ICD-10-CM

## 2023-09-01 ENCOUNTER — Encounter: Payer: Self-pay | Admitting: Student

## 2023-09-01 ENCOUNTER — Ambulatory Visit (INDEPENDENT_AMBULATORY_CARE_PROVIDER_SITE_OTHER): Payer: 59 | Admitting: Student

## 2023-09-01 VITALS — BP 128/83 | HR 85 | Wt 189.0 lb

## 2023-09-01 DIAGNOSIS — E119 Type 2 diabetes mellitus without complications: Secondary | ICD-10-CM | POA: Diagnosis not present

## 2023-09-01 DIAGNOSIS — L509 Urticaria, unspecified: Secondary | ICD-10-CM

## 2023-09-01 LAB — POCT GLYCOSYLATED HEMOGLOBIN (HGB A1C): HbA1c, POC (controlled diabetic range): 7.4 % — AB (ref 0.0–7.0)

## 2023-09-01 MED ORDER — TETANUS-DIPHTH-ACELL PERTUSSIS 5-2.5-18.5 LF-MCG/0.5 IM SUSP: 0.5000 mL | Freq: Once | INTRAMUSCULAR | 0 refills | Status: AC

## 2023-09-01 MED ORDER — DOXEPIN HCL 10 MG PO CAPS: 10.0000 mg | ORAL_CAPSULE | Freq: Every day | ORAL | 2 refills | Status: DC

## 2023-09-01 MED ORDER — MOUNJARO 15 MG/0.5ML ~~LOC~~ SOAJ: 15.0000 mg | SUBCUTANEOUS | 2 refills | Status: DC

## 2023-09-01 NOTE — Patient Instructions (Addendum)
 DIABETES A1c is 7.4%.  You can stop the humalog. Lets try cutting the Lantus  in half to just 10 units/night. We are going to increase your mounjaro  to 15mg /week.   DO not take the Meloxicam  that the ER sent in.  HIVES I want you to take Zyrtec  TWICE EVERY day.  Keep taking the famotidine  also.  I am going to change you from Benadryl  to a medication called doxepin  which can help with both itching and sleep. We will start at 10mg , we may need to go up.  I am eager to hear what the allergist says on the 24th.  You can see me in 3 months or sooner if needed.   Alexa Andrews, MD

## 2023-09-01 NOTE — Progress Notes (Signed)
    SUBJECTIVE:   CHIEF COMPLAINT / HPI:   DM2 Brings in her CGM data today. On review of recent days she is mostly in range. Max glucose on review is 239.  She has been taking 20 units of Lantus daily in addition to 5 units of humalog with her largest meal (only on some days) and 10 mg of Mounjaro weekly.  Feels quite well.  No complaints.  Urticaria Has persistent urticaria that is quite bothersome to her, it is especially bothersome to her at night when she cannot sleep.  She has been taking Benadryl nightly for this.  She was previously taking daily Zyrtec but actually stopped taking this after someone told her that it was not wise to take Zyrtec and Benadryl at the same time.  She is also on chronic famotidine therapy for this.  She has her initial consult with allergy in 1 week. She denies ever having any issues with breathing but does have an epi pen at home.    OBJECTIVE:   BP 128/83   Pulse 85   Wt 189 lb (85.7 kg)   LMP 03/25/2012   SpO2 100%   BMI 32.44 kg/m   Gen: Well appearing and NAD HENT: MMM, oropharynx clear Cardio: RRR Pulm: Normal WOB on RA, lungs clear throughout  Skin: Diffuse urticaria over her BUE and BLE with stigmata of excoriation  ASSESSMENT/PLAN:   Assessment & Plan Type 2 diabetes mellitus without complication, without long-term current use of insulin (HCC) A1c is 7.4% today.  Congratulated on her success in controlling this.  I am hopeful we may be able to get her off of insulin altogether. - INCREASE Mounjaro to 15mg /week - STOP Humalog - DECREASE Lantus to 10 units/day  Urticaria Quite bothersome for her, especially at night.  I would prefer that she not be taking daily Benadryl, so we will try something else for nighttime control. -Restart Zyrtec, would have her take this twice daily for now until she sees allergy -Stop Benadryl -Start doxepin 10 mg nightly for itching and sleep -Has allergy follow-up in one week -Epi Pen at home    Mentioned hair loss at the end of visit, coming back in 2 weeks to discuss further.   Eliezer Mccoy, MD [facility]

## 2023-09-02 ENCOUNTER — Telehealth: Payer: Self-pay

## 2023-09-02 NOTE — Telephone Encounter (Signed)
Patient calls nurse line in regards to rash.   She reports she took two Zyrtec yesterday and one Doxepin before bed, however she woke up this morning and the rash "seemed worse."   She reports she did take a Zyrtec this morning which seemed to help.   She denies any fevers or swelling.   Advised to continue two Zyrtec daily and Doxepin at bedtime and to followup with Allergist as scheduled.   Will forward to PCP.

## 2023-09-03 NOTE — Assessment & Plan Note (Signed)
Quite bothersome for her, especially at night.  I would prefer that she not be taking daily Benadryl, so we will try something else for nighttime control. -Restart Zyrtec, would have her take this twice daily for now until she sees allergy -Stop Benadryl -Start doxepin 10 mg nightly for itching and sleep -Has allergy follow-up in one week -Epi Pen at home

## 2023-09-03 NOTE — Assessment & Plan Note (Signed)
A1c is 7.4% today.  Congratulated on her success in controlling this.  I am hopeful we may be able to get her off of insulin altogether. - INCREASE Mounjaro to 15mg /week - STOP Humalog - DECREASE Lantus to 10 units/day

## 2023-09-04 ENCOUNTER — Other Ambulatory Visit: Payer: Self-pay | Admitting: Student

## 2023-09-04 DIAGNOSIS — E119 Type 2 diabetes mellitus without complications: Secondary | ICD-10-CM

## 2023-09-04 NOTE — Telephone Encounter (Signed)
Called patient. She reports that her itching is much improved with the doxepin. No  longer needing Benadryl. Still has visible urticaria. No issues with breathing and feeling well otherwise. Has allergist appointment coming up next week.  Continue doxepin at bedtime and BID Zyrtec for now.

## 2023-09-18 ENCOUNTER — Ambulatory Visit (INDEPENDENT_AMBULATORY_CARE_PROVIDER_SITE_OTHER): Payer: 59 | Admitting: Student

## 2023-09-18 ENCOUNTER — Encounter: Payer: Self-pay | Admitting: Student

## 2023-09-18 VITALS — BP 116/80 | HR 80 | Ht 64.0 in | Wt 185.2 lb

## 2023-09-18 DIAGNOSIS — L659 Nonscarring hair loss, unspecified: Secondary | ICD-10-CM

## 2023-09-18 DIAGNOSIS — Z7985 Long-term (current) use of injectable non-insulin antidiabetic drugs: Secondary | ICD-10-CM | POA: Diagnosis not present

## 2023-09-18 DIAGNOSIS — L658 Other specified nonscarring hair loss: Secondary | ICD-10-CM

## 2023-09-18 DIAGNOSIS — Z23 Encounter for immunization: Secondary | ICD-10-CM | POA: Diagnosis not present

## 2023-09-18 DIAGNOSIS — E119 Type 2 diabetes mellitus without complications: Secondary | ICD-10-CM

## 2023-09-18 MED ORDER — DEXCOM G6 TRANSMITTER MISC: 1.0000 | 0 refills | Status: DC

## 2023-09-18 MED ORDER — MINOXIDIL 5 % EX FOAM
CUTANEOUS | 1 refills | Status: AC
Start: 2023-09-18 — End: ?

## 2023-09-18 MED ORDER — DEXCOM G6 SENSOR MISC: 1.0000 | 1 refills | Status: DC

## 2023-09-18 NOTE — Patient Instructions (Addendum)
 Cathy Bond,  I think that the hair loss on the front of your scalp is from your hair ladies pulling too tightly on the hair. I would recommend you go with more natural and loose hair styles for a while to give this a chance to grow back. I am also sending in some topical foam that you can use.   Come back to see me in May for diabetes follow-up.   Eliezer Mccoy, MD

## 2023-09-18 NOTE — Progress Notes (Signed)
    SUBJECTIVE:   CHIEF COMPLAINT / HPI:   Hair Loss Ongoing for some time. Concentrated toward the front of her scalp. Often wears her hair in tight braids or a ponytail. Does tell me that the women who do her hair sometimes "pull real hard." Denies any recent life stressors aside from a hospitalization for DKA about 9 months ago. No significant itching to the scalp. No recent changes in hair products. Feels well otherwise.    OBJECTIVE:   BP 116/80   Pulse 80   Ht 5\' 4"  (1.626 m)   Wt 185 lb 3.2 oz (84 kg)   LMP 03/25/2012   SpO2 100%   BMI 31.79 kg/m   Gen: Well-appearing, age appropriate and NAD  Head: Hair is up in a ponytail. There is hair loss along the front of the scalp. There are no other patches or patterns of hair loss appreciated. Hair pull test is negative.     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 Negative hair pull test.  - Advised loose/natural hair styles - Topical minoxidil - Reassurance provided  Type 2 diabetes mellitus without complication, without long-term current use of insulin (HCC) Broke her Dexcom transmitter. Also needs more sensors. - Refills as requested - PNA vaccine today  - F/u in May for diabetic follow-up     J Dorothyann Gibbs, MD [facility]

## 2023-09-19 NOTE — Assessment & Plan Note (Addendum)
 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 Also needs more sensors. - Refills as requested - PNA vaccine today  - F/u in May for diabetic follow-up

## 2023-09-20 ENCOUNTER — Other Ambulatory Visit: Payer: Self-pay | Admitting: Student

## 2023-09-20 DIAGNOSIS — E119 Type 2 diabetes mellitus without complications: Secondary | ICD-10-CM

## 2023-11-05 ENCOUNTER — Other Ambulatory Visit: Payer: Self-pay | Admitting: Student

## 2023-11-05 DIAGNOSIS — L509 Urticaria, unspecified: Secondary | ICD-10-CM

## 2023-11-10 ENCOUNTER — Other Ambulatory Visit: Payer: Self-pay | Admitting: Student

## 2023-11-21 ENCOUNTER — Other Ambulatory Visit: Payer: Self-pay | Admitting: Student

## 2023-11-21 DIAGNOSIS — E119 Type 2 diabetes mellitus without complications: Secondary | ICD-10-CM

## 2023-12-13 ENCOUNTER — Other Ambulatory Visit: Payer: Self-pay | Admitting: Student

## 2023-12-13 DIAGNOSIS — E119 Type 2 diabetes mellitus without complications: Secondary | ICD-10-CM

## 2023-12-22 ENCOUNTER — Other Ambulatory Visit: Payer: Self-pay | Admitting: Family Medicine

## 2023-12-22 DIAGNOSIS — E119 Type 2 diabetes mellitus without complications: Secondary | ICD-10-CM

## 2023-12-29 ENCOUNTER — Other Ambulatory Visit: Payer: Self-pay | Admitting: Student

## 2023-12-29 DIAGNOSIS — E119 Type 2 diabetes mellitus without complications: Secondary | ICD-10-CM

## 2023-12-30 ENCOUNTER — Encounter: Payer: Self-pay | Admitting: *Deleted

## 2024-01-02 ENCOUNTER — Ambulatory Visit (INDEPENDENT_AMBULATORY_CARE_PROVIDER_SITE_OTHER): Admitting: Student

## 2024-01-02 VITALS — BP 132/80 | HR 74 | Ht 64.0 in | Wt 176.0 lb

## 2024-01-02 DIAGNOSIS — G8929 Other chronic pain: Secondary | ICD-10-CM | POA: Diagnosis not present

## 2024-01-02 DIAGNOSIS — M545 Low back pain, unspecified: Secondary | ICD-10-CM

## 2024-01-02 DIAGNOSIS — E119 Type 2 diabetes mellitus without complications: Secondary | ICD-10-CM

## 2024-01-02 DIAGNOSIS — Z1231 Encounter for screening mammogram for malignant neoplasm of breast: Secondary | ICD-10-CM | POA: Diagnosis not present

## 2024-01-02 LAB — POCT GLYCOSYLATED HEMOGLOBIN (HGB A1C): HbA1c, POC (controlled diabetic range): 7.7 % — AB (ref 0.0–7.0)

## 2024-01-02 MED ORDER — LANTUS SOLOSTAR 100 UNIT/ML ~~LOC~~ SOPN
10.0000 [IU] | PEN_INJECTOR | Freq: Every day | SUBCUTANEOUS | 0 refills | Status: DC
Start: 2024-01-02 — End: 2024-05-20

## 2024-01-02 NOTE — Patient Instructions (Addendum)
 PLEASE restart your gym membership. The physical therapists will call you for an appointment.   Your shoulder pain will go away with time.   Your A1c is 7.7%. Let's keep the Mounjaro  going at the same dose. We'll keep the Lantus  on for now, but I remain hopeful that we may be able to come off of this in the future.   I have ordered your mammogram. This will be done at the [facility] of [place], right across the street from our clinic. You will need to call to make this appointment. Their number is [phone].  The address is [address], [place], [place] [address]  I recommend the following vaccines which you can get at your local pharmacy: TDaP and Shingles (2 shot series)  J Lark Plum, MD

## 2024-01-04 NOTE — Assessment & Plan Note (Signed)
 A1c is 7.7%.  I do suspect that she might be able to come off of her low-dose nightly insulin .  I have previously checked a C-peptide to ensure that she has intact beta cell function which indeed she does.  However, I do understand her hesitancy to stop insulin  given recent admission for DKA.  Therefore we will continue at current dose for now. -Continue Mounjaro  15 mg weekly -Continue Lantus  10 units nightly -Due for urine albumin creatinine ratio today

## 2024-01-04 NOTE — Assessment & Plan Note (Signed)
 Without red flag signs on history or exam.  Discussed that she can safely return to the gym and resume her previous activity.  Resistance training is likely to be quite helpful for her. -Referral to physical therapy

## 2024-01-04 NOTE — Progress Notes (Signed)
    SUBJECTIVE:   CHIEF COMPLAINT / HPI:   Diabetes A1c is 7.7% today.  Stable over the past 6 months.  She remains on Mounjaro  15 mg weekly and is taking just 10 units of Lantus  nightly.  We have been discussing discontinuing her Lantus  for a while now as she has been quite well-controlled for some time, though she is very hesitant to do so as she she had a hospital admission less than a year ago for diabetic ketoacidosis.  She would like to continue on this low-dose insulin  for the time being.  Back Pain Has some chronic low back pain.  No history of trauma.  Was seeing a chiropractor for this but has stopped seeing him after he told her to quit her gym membership.  She has not tried physical therapy.  Denies any weakness, fever, saddle anesthesia, bowel or bladder incontinence.  Health Maintenance Due for mammogram. Also needs Tdap and Shingles. Has Medicare due to disability (she is not entirely sure what her qualifying disability is, she believes some sort of learning disability from childhood).    OBJECTIVE:   BP 132/80   Pulse 74   Ht 5' 4 (1.626 m)   Wt 176 lb (79.8 kg)   LMP 03/25/2012   SpO2 98%   BMI 30.21 kg/m   General: Well-appearing, engaged, no distress HEENT: Mucous membranes are moist MSK: Low back exam without midline or paraspinal muscle tenderness.  Negative straight leg raise bilaterally.  She has full range of motion.  There is no tenderness to palpation of the SI joints.  ASSESSMENT/PLAN:   Assessment & Plan Type 2 diabetes mellitus without complication, without long-term current use of insulin  (HCC) A1c is 7.7%.  I do suspect that she might be able to come off of her low-dose nightly insulin .  I have previously checked a C-peptide to ensure that she has intact beta cell function which indeed she does.  However, I do understand her hesitancy to stop insulin  given recent admission for DKA.  Therefore we will continue at current dose for now. -Continue  Mounjaro  15 mg weekly -Continue Lantus  10 units nightly -Due for urine albumin creatinine ratio today Chronic bilateral low back pain without sciatica Without red flag signs on history or exam.  Discussed that she can safely return to the gym and resume her previous activity.  Resistance training is likely to be quite helpful for her. -Referral to physical therapy   Healthcare maintenance Mammogram is ordered today Patient received Tdap and shingles at local pharmacy given her Medicare status  J Lark Plum, MD [facility]

## 2024-01-08 LAB — MICROALBUMIN / CREATININE URINE RATIO

## 2024-01-13 ENCOUNTER — Ambulatory Visit: Attending: Family Medicine

## 2024-01-13 ENCOUNTER — Other Ambulatory Visit: Payer: Self-pay

## 2024-01-13 DIAGNOSIS — M545 Low back pain, unspecified: Secondary | ICD-10-CM | POA: Insufficient documentation

## 2024-01-13 DIAGNOSIS — G8929 Other chronic pain: Secondary | ICD-10-CM | POA: Diagnosis not present

## 2024-01-13 DIAGNOSIS — M25551 Pain in right hip: Secondary | ICD-10-CM | POA: Insufficient documentation

## 2024-01-13 DIAGNOSIS — M5416 Radiculopathy, lumbar region: Secondary | ICD-10-CM | POA: Insufficient documentation

## 2024-01-13 DIAGNOSIS — R262 Difficulty in walking, not elsewhere classified: Secondary | ICD-10-CM | POA: Diagnosis present

## 2024-01-13 DIAGNOSIS — M25561 Pain in right knee: Secondary | ICD-10-CM | POA: Diagnosis present

## 2024-01-13 DIAGNOSIS — M25562 Pain in left knee: Secondary | ICD-10-CM | POA: Insufficient documentation
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
## 2024-01-19 ENCOUNTER — Other Ambulatory Visit: Payer: Self-pay

## 2024-01-19 DIAGNOSIS — E119 Type 2 diabetes mellitus without complications: Secondary | ICD-10-CM

## 2024-01-20 MED ORDER — DEXCOM G6 SENSOR MISC: 1.0000 | 1 refills | Status: AC

## 2024-01-20 MED ORDER — DEXCOM G6 TRANSMITTER MISC: 0 refills | Status: AC

## 2024-01-28 ENCOUNTER — Ambulatory Visit
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
## 2024-01-30 ENCOUNTER — Ambulatory Visit: Attending: Family Medicine

## 2024-01-30 ENCOUNTER — Other Ambulatory Visit: Payer: Self-pay
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
## 2024-02-02 MED ORDER — LOSARTAN POTASSIUM 50 MG PO TABS: ORAL_TABLET | ORAL | 1 refills | Status: DC

## 2024-02-03 ENCOUNTER — Ambulatory Visit

## 2024-02-05 ENCOUNTER — Telehealth: Payer: Self-pay

## 2024-02-05 ENCOUNTER — Ambulatory Visit

## 2024-02-05 NOTE — Telephone Encounter (Signed)
 Attempted to call regarding missed appointment, VM full and unable to leave message. Since this is the 3rd no-show appointment, patient will be DC from PT at this time and new referral required to return.   Corean Pouch, PTA 02/05/24 11:19 AM
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
## 2024-02-06 ENCOUNTER — Other Ambulatory Visit: Payer: Self-pay

## 2024-02-06 DIAGNOSIS — J301 Allergic rhinitis due to pollen: Secondary | ICD-10-CM

## 2024-02-06 DIAGNOSIS — E119 Type 2 diabetes mellitus without complications: Secondary | ICD-10-CM

## 2024-02-08 MED ORDER — CETIRIZINE HCL 10 MG PO TABS: ORAL_TABLET | ORAL | 3 refills | Status: AC

## 2024-02-08 MED ORDER — MOUNJARO 15 MG/0.5ML ~~LOC~~ SOAJ
15.0000 mg | SUBCUTANEOUS | 2 refills | Status: DC
Start: 2024-02-08 — End: 2024-05-03

## 2024-02-09 ENCOUNTER — Ambulatory Visit: Admitting: Physical Therapy

## 2024-02-12 ENCOUNTER — Ambulatory Visit: Admitting: Physical Therapy

## 2024-02-16 ENCOUNTER — Encounter: Admitting: Physical Therapy

## 2024-02-19 ENCOUNTER — Encounter

## 2024-02-21 ENCOUNTER — Other Ambulatory Visit: Payer: Self-pay | Admitting: Family Medicine

## 2024-02-21 DIAGNOSIS — E119 Type 2 diabetes mellitus without complications: Secondary | ICD-10-CM

## 2024-03-05 ENCOUNTER — Other Ambulatory Visit: Payer: Self-pay

## 2024-03-05 DIAGNOSIS — L509 Urticaria, unspecified: Secondary | ICD-10-CM

## 2024-03-06 MED ORDER — DOXEPIN HCL 10 MG PO CAPS: 10.0000 mg | ORAL_CAPSULE | Freq: Every evening | ORAL | 2 refills | Status: DC | PRN

## 2024-05-01 ENCOUNTER — Other Ambulatory Visit: Payer: Self-pay | Admitting: Family Medicine

## 2024-05-01 DIAGNOSIS — E119 Type 2 diabetes mellitus without complications: Secondary | ICD-10-CM

## 2024-05-20 ENCOUNTER — Other Ambulatory Visit: Payer: Self-pay

## 2024-05-20 ENCOUNTER — Ambulatory Visit

## 2024-05-20 VITALS — Ht 64.0 in | Wt 163.0 lb

## 2024-05-20 DIAGNOSIS — E119 Type 2 diabetes mellitus without complications: Secondary | ICD-10-CM

## 2024-05-20 DIAGNOSIS — Z0000 Encounter for general adult medical examination without abnormal findings: Secondary | ICD-10-CM

## 2024-05-20 MED ORDER — LANTUS SOLOSTAR 100 UNIT/ML ~~LOC~~ SOPN: 10.0000 [IU] | PEN_INJECTOR | Freq: Every day | SUBCUTANEOUS | 0 refills | Status: DC

## 2024-05-20 NOTE — Progress Notes (Cosign Needed Addendum)
 Because this visit was a virtual/telehealth visit,  certain criteria was not obtained, such a blood pressure, CBG if applicable, and timed get up and go. Any medications not marked as taking were not mentioned during the medication reconciliation part of the visit. Any vitals not documented were not able to be obtained due to this being a telehealth visit or patient was unable to self-report a recent blood pressure reading due to a lack of equipment at home via telehealth. Vitals that have been documented are verbally provided by the patient.   Subjective:   Cathy Bond is a 53 y.o. who presents for a Medicare Wellness preventive visit.  As a reminder, Annual Wellness Visits don't include a physical exam, and some assessments may be limited, especially if this visit is performed virtually. We may recommend an in-person follow-up visit with your provider if needed.  Visit Complete: Virtual I connected with  Cathy Bond on 05/20/24 by a audio enabled telemedicine application and verified that I am speaking with the correct person using two identifiers.  Patient Location: Home  Provider Location: Office/Clinic  I discussed the limitations of evaluation and management by telemedicine. The patient expressed understanding and agreed to proceed.  Vital Signs: Because this visit was a virtual/telehealth visit, some criteria may be missing or patient reported. Any vitals not documented were not able to be obtained and vitals that have been documented are patient reported.  VideoDeclined- This patient declined Librarian, academic. Therefore the visit was completed with audio only.  Persons Participating in Visit: Patient.  AWV Questionnaire: No: Patient Medicare AWV questionnaire was not completed prior to this visit.  Cardiac Risk Factors include: advanced age (>49men, >69 women);diabetes mellitus;family history of premature cardiovascular disease      Objective:    Today's Vitals   05/20/24 1734  Weight: 163 lb (73.9 kg)  Height: 5' 4 (1.626 m)  PainSc: 0-No pain   Body mass index is 27.98 kg/m.     05/20/2024    5:36 PM 01/13/2024    8:52 AM 01/02/2024    2:29 PM 08/24/2023   12:17 PM 06/09/2023    2:40 PM 05/29/2023   10:34 AM 05/27/2023    1:10 PM  Advanced Directives  Does Patient Have a Medical Advance Directive? No No No No No No No  Would patient like information on creating a medical advance directive? No - Patient declined No - Patient declined No - Patient declined  No - Patient declined No - Patient declined     Current Medications (verified) Outpatient Encounter Medications as of 05/20/2024  Medication Sig   BD PEN NEEDLE MINI ULTRAFINE 31G X 5 MM MISC USE FOUR TIMES DAILY   Blood Glucose Monitoring Suppl (BLOOD GLUCOSE MONITOR SYSTEM) w/Device KIT Use to test blood sugar 3 (three) times daily.   cetirizine  (ZYRTEC ) 10 MG tablet TAKE 1 TABLET(10 MG) BY MOUTH DAILY   Continuous Glucose Sensor (DEXCOM G6 SENSOR) MISC Place 1 each onto the skin continuous.   Continuous Glucose Transmitter (DEXCOM G6 TRANSMITTER) MISC Please use with Dexcom G6 sensor for continuous glucose monitoing.   doxepin  (SINEQUAN ) 10 MG capsule Take 1 capsule (10 mg total) by mouth at bedtime as needed.   EPINEPHrine  0.3 mg/0.3 mL IJ SOAJ injection Inject 0.3 mg into the muscle as needed for anaphylaxis.   famotidine  (PEPCID ) 20 MG tablet Take 1 tablet (20 mg total) by mouth daily.   glucose blood (ACCU-CHEK GUIDE TEST) test strip USE  TO CHECK BLOOD SUGAR FOUR TIMES DAILY   Glucose Blood (BLOOD GLUCOSE TEST STRIPS) STRP Use to test blood sugar 3 (three) times daily.   hydrOXYzine  (ATARAX ) 25 MG tablet TAKE 1/2 TO 1 TABLET(12.5 TO 25 MG) BY MOUTH EVERY 8 HOURS AS NEEDED FOR ITCHING   insulin  glargine (LANTUS  SOLOSTAR) 100 UNIT/ML Solostar Pen Inject 10 Units into the skin at bedtime.   Lancet Device MISC Use 3 times daily as directed   Lancets  (FREESTYLE) lancets Use as instructed to test blood glucose twice daily   Lancets MISC Use as directed to check blood sugar 3 times daily.   losartan  (COZAAR ) 50 MG tablet TAKE 1 TABLET(50 MG) BY MOUTH DAILY   Minoxidil  5 % FOAM Apply to affected areas of the scalp twice daily.   mometasone  (NASONEX ) 50 MCG/ACT nasal spray Place 2 sprays into the nose daily.   MOUNJARO  15 MG/0.5ML Pen ADMINISTER 15 MG UNDER THE SKIN 1 TIME A WEEK   Multiple Vitamin (MULITIVITAMIN WITH MINERALS) TABS Take 1 tablet by mouth daily.   Multiple Vitamins-Minerals (HAIR/SKIN/NAILS PO) Take 2 tablets by mouth 2 (two) times daily.   nitroGLYCERIN  (NITROSTAT ) 0.3 MG SL tablet Place 1 tablet (0.3 mg total) under the tongue every 5 (five) minutes as needed for chest pain.   polyethylene glycol powder (GLYCOLAX /MIRALAX ) 17 GM/SCOOP powder Take 17 g by mouth daily.   rosuvastatin  (CRESTOR ) 20 MG tablet TAKE 1 TABLET(20 MG) BY MOUTH DAILY   No facility-administered encounter medications on file as of 05/20/2024.    Allergies (verified) Jardiance  [empagliflozin ], Apple juice, Shellfish allergy, Wellbutrin  [bupropion  hcl], and Metformin  and related   History: Past Medical History:  Diagnosis Date   Anxiety    BV (bacterial vaginosis)    Depression    Diabetes mellitus    Hypertension    Seasonal allergies    Trichomonas    Yeast vaginitis    Past Surgical History:  Procedure Laterality Date   endometrial biopsy  2 in 2013   negative   toe nail     TUBAL LIGATION     Family History  Problem Relation Age of Onset   Diabetes Mother    Stroke Mother    Cancer Father    Depression Sister    Alcohol abuse Sister    Diabetes Sister    Depression Brother    Diabetes Brother    Social History   Socioeconomic History   Marital status: Single    Spouse name: Not on file   Number of children: 5   Years of education: 10   Highest education level: 10th grade  Occupational History   Occupation: Retired     Associate Professor: [employer]  Tobacco Use   Smoking status: Former    Current packs/day: 0.00    Average packs/day: 1 pack/day for 6.0 years (6.0 ttl pk-yrs)    Types: Cigarettes    Start date: 01/20/2008    Quit date: 01/19/2014    Years since quitting: 10.3   Smokeless tobacco: Never   Tobacco comments:    No plans to restart.  Substance and Sexual Activity   Alcohol use: No   Drug use: No    Comment: No hx of IV use   Sexual activity: Yes    Birth control/protection: Surgical  Other Topics Concern   Not on file  Social History Narrative   Patients live with finance in [place].    Patient has 5 children and multiple  grandchildren.    They all live in surrounding areas of [place]. Close relationships.    Patient enjoys bowling, painting, family, gardening and sewing.       Social Drivers of Corporate Investment Banker Strain: Low Risk  (05/20/2024)   Overall Financial Resource Strain (CARDIA)    Difficulty of Paying Living Expenses: Not hard at all  Food Insecurity: No Food Insecurity (05/20/2024)   Hunger Vital Sign    Worried About Running Out of Food in the Last Year: Never true    Ran Out of Food in the Last Year: Never true  Transportation Needs: No Transportation Needs (05/20/2024)   PRAPARE - Administrator, Civil Service (Medical): No    Lack of Transportation (Non-Medical): No  Physical Activity: Inactive (05/20/2024)   Exercise Vital Sign    Days of Exercise per Week: 0 days    Minutes of Exercise per Session: 0 min  Stress: No Stress Concern Present (05/20/2024)   [employer] of Occupational Health - Occupational Stress Questionnaire    Feeling of Stress: Not at all  Social Connections: Moderately Integrated (05/20/2024)   Social Connection and Isolation Panel    Frequency of Communication with Friends and Family: More than three times a week    Frequency of Social Gatherings with Friends and Family: More than three times a  week    Attends Religious Services: More than 4 times per year    Active Member of [employer] or Organizations: Yes    Attends Engineer, Structural: More than 4 times per year    Marital Status: Never married    Tobacco Counseling Counseling given: Not Answered Tobacco comments: No plans to restart.    Clinical Intake:  Pre-visit preparation completed: Yes  Pain : No/denies pain Pain Score: 0-No pain     BMI - recorded: 27.98 Nutritional Status: BMI 25 -29 Overweight Nutritional Risks: None Diabetes: Yes CBG done?: No Did pt. bring in CBG monitor from home?: No  Lab Results  Component Value Date   HGBA1C 7.7 (A) 01/02/2024   HGBA1C 7.4 (A) 09/01/2023   HGBA1C 7.7 (A) 06/09/2023     How often do you need to have someone help you when you read instructions, pamphlets, or other written materials from your doctor or pharmacy?: 1 - Never  Interpreter Needed?: No  Information entered by :: Bentleigh Stankus N. Saumya Hukill, LPN.   Activities of Daily Living     05/20/2024    5:38 PM  In your present state of health, do you have any difficulty performing the following activities:  Hearing? 0  Vision? 0  Difficulty concentrating or making decisions? 0  Walking or climbing stairs? 0  Dressing or bathing? 0  Doing errands, shopping? 0  Preparing Food and eating ? N  Using the Toilet? N  In the past six months, have you accidently leaked urine? N  Do you have problems with loss of bowel control? N  Managing your Medications? N  Managing your Finances? N  Housekeeping or managing your Housekeeping? N    Patient Care Team: Nicholas Bar, MD as PCP - General (Family Medicine)  I have updated your Care Teams any recent Medical Services you may have received from other providers in the past year.     Assessment:   This is a routine wellness examination for Cathy Bond.  Hearing/Vision screen Hearing Screening - Comments:: Denies hearing difficulties.  Vision Screening -  Comments:: Wears rx glasses - not up to  date with routine eye exams with  Arley Ruder, MD.    Goals Addressed             This Visit's Progress    05/20/2024: To change ,y eating habits, start choosing healthier options and stay away from sweets.         Depression Screen     05/20/2024    5:39 PM 01/02/2024    2:13 PM 09/18/2023   11:02 AM 09/01/2023   11:03 AM 06/09/2023    2:40 PM 12/06/2022   11:14 AM 11/22/2022   10:16 AM  PHQ 2/9 Scores  PHQ - 2 Score 0 0 0 0 0 0 0  PHQ- 9 Score 1 3 0 3 1 3 2     Fall Risk     05/20/2024    5:36 PM 11/22/2022    9:47 AM 11/05/2022    1:44 PM 09/18/2021   11:13 AM 08/16/2021    2:30 PM  Fall Risk   Falls in the past year? 0 0 0 0 0  Number falls in past yr: 0  0  0  Injury with Fall? 0 0 0  0  Risk for fall due to : No Fall Risks  No Fall Risks  Orthopedic patient  Follow up Falls evaluation completed  Falls prevention discussed  Falls prevention discussed      Data saved with a previous flowsheet row definition    MEDICARE RISK AT HOME:  Medicare Risk at Home Any stairs in or around the home?: No If so, are there any without handrails?: No Home free of loose throw rugs in walkways, pet beds, electrical cords, etc?: Yes Adequate lighting in your home to reduce risk of falls?: Yes Life alert?: No Use of a cane, walker or w/c?: No Grab bars in the bathroom?: No Shower chair or bench in shower?: No Elevated toilet seat or a handicapped toilet?: No  TIMED UP AND GO:  Was the test performed?  No  Cognitive Function: 6CIT completed    05/20/2024    5:39 PM  MMSE - Mini Mental State Exam  Not completed: Unable to complete        05/20/2024    5:35 PM 11/05/2022    1:45 PM 08/16/2021    2:33 PM  6CIT Screen  What Year? 0 points 0 points 0 points  What month? 0 points 0 points 0 points  What time? 0 points 0 points 0 points  Count back from 20 0 points 2 points 0 points  Months in reverse 0 points 4 points 0 points   Repeat phrase 0 points 4 points 0 points  Total Score 0 points 10 points 0 points    Immunizations Immunization History  Administered Date(s) Administered   Influenza,inj,Quad PF,6+ Mos 06/05/2022   PNEUMOCOCCAL CONJUGATE-20 09/18/2023   Pneumococcal Polysaccharide-23 10/27/2014   Td 12/25/2003    Screening Tests Health Maintenance  Topic Date Due   Hepatitis B Vaccines 19-59 Average Risk (1 of 3 - 19+ 3-dose series) Never done   DTaP/Tdap/Td (2 - Tdap) 12/24/2013   OPHTHALMOLOGY EXAM  01/25/2014   Zoster Vaccines- Shingrix (1 of 2) Never done   Mammogram  02/01/2023   Influenza Vaccine  02/20/2024   COVID-19 Vaccine (1 - 2025-26 season) Never done   Diabetic kidney evaluation - eGFR measurement  05/26/2024   HEMOGLOBIN A1C  07/03/2024   Diabetic kidney evaluation - Urine ACR  01/01/2025   Medicare Annual Wellness (AWV)  05/20/2025  Cervical Cancer Screening (HPV/Pap Cotest)  11/12/2027   Colonoscopy  11/19/2029   Pneumococcal Vaccine: 50+ Years  Completed   Hepatitis C Screening  Completed   HIV Screening  Completed   HPV VACCINES  Aged Out   Meningococcal B Vaccine  Aged Out   FOOT EXAM  Discontinued    Health Maintenance Items Addressed: Vaccines Due: Dtap, Flu, Hepatitis B, Shingrix, Covid, Labs Due Diabetic Kidney Evaluation, Mammogram and Eye Exam.  Additional Screening:  Vision Screening: Recommended annual ophthalmology exams for early detection of glaucoma and other disorders of the eye. Is the patient up to date with their annual eye exam?  No  Who is the provider or what is the name of the office in which the patient attends annual eye exams? Defer to PCP  Dental Screening: Recommended annual dental exams for proper oral hygiene  Community Resource Referral / Chronic Care Management: CRR required this visit?  No   CCM required this visit?  No   Plan:    I have personally reviewed and noted the following in the patient's chart:   Medical and  social history Use of alcohol, tobacco or illicit drugs  Current medications and supplements including opioid prescriptions. Patient is not currently taking opioid prescriptions. Functional ability and status Nutritional status Physical activity Advanced directives List of other physicians Hospitalizations, surgeries, and ER visits in previous 12 months Vitals Screenings to include cognitive, depression, and falls Referrals and appointments  In addition, I have reviewed and discussed with patient certain preventive protocols, quality metrics, and best practice recommendations. A written personalized care plan for preventive services as well as general preventive health recommendations were provided to patient.   Cathy LOISE Fuller, LPN   [id number]   After Visit Summary: (MyChart) Due to this being a telephonic visit, the after visit summary with patients personalized plan was offered to patient via MyChart   Notes: Vaccines Due: Dtap, Flu, Hepatitis B, Shingrix, Covid, Labs Due Diabetic Kidney Evaluation, Mammogram and Eye Exam.

## 2024-05-20 NOTE — Patient Instructions (Addendum)
 [name],  Thank you for taking the time for your Medicare Wellness Visit. I appreciate your continued commitment to your health goals. Please review the care plan we discussed, and feel free to reach out if I can assist you further.  Medicare recommends these wellness visits once per year to help you and your care team stay ahead of potential health issues. These visits are designed to focus on prevention, allowing your provider to concentrate on managing your acute and chronic conditions during your regular appointments.  Please note that Annual Wellness Visits do not include a physical exam. Some assessments may be limited, especially if the visit was conducted virtually. If needed, we may recommend a separate in-person follow-up with your provider.  Ongoing Care Seeing your primary care provider every 3 to 6 months helps us  monitor your health and provide consistent, personalized care.   Referrals If a referral was made during today's visit and you haven't received any updates within two weeks, please contact the referred provider directly to check on the status.  Recommended Screenings:  Health Maintenance  Topic Date Due   Hepatitis B Vaccine (1 of 3 - 19+ 3-dose series) Never done   DTaP/Tdap/Td vaccine (2 - Tdap) 12/24/2013   Eye exam for diabetics  01/25/2014   Zoster (Shingles) Vaccine (1 of 2) Never done   Breast Cancer Screening  02/01/2023   Flu Shot  02/20/2024   COVID-19 Vaccine (1 - 2025-26 season) Never done   Yearly kidney function blood test for diabetes  05/26/2024   Hemoglobin A1C  07/03/2024   Yearly kidney health urinalysis for diabetes  01/01/2025   Medicare Annual Wellness Visit  05/20/2025   Pap with HPV screening  11/12/2027   Colon Cancer Screening  11/19/2029   Pneumococcal Vaccine for age over 52  Completed   Hepatitis C Screening  Completed   HIV Screening  Completed   HPV Vaccine  Aged Out   Meningitis B Vaccine  Aged Out   Complete foot exam    Discontinued       05/20/2024    5:36 PM  Advanced Directives  Does Patient Have a Medical Advance Directive? No  Would patient like information on creating a medical advance directive? No - Patient declined   Advance Care Planning is important because it: Ensures you receive medical care that aligns with your values, goals, and preferences. Provides guidance to your family and loved ones, reducing the emotional burden of decision-making during critical moments.  Vision: Annual vision screenings are recommended for early detection of glaucoma, cataracts, and diabetic retinopathy. These exams can also reveal signs of chronic conditions such as diabetes and high blood pressure.  Dental: Annual dental screenings help detect early signs of oral cancer, gum disease, and other conditions linked to overall health, including heart disease and diabetes.  Please see the attached documents for additional preventive care recommendations.

## 2024-06-08 ENCOUNTER — Other Ambulatory Visit: Payer: Self-pay | Admitting: Family Medicine

## 2024-06-08 DIAGNOSIS — L509 Urticaria, unspecified: Secondary | ICD-10-CM

## 2024-07-19 ENCOUNTER — Other Ambulatory Visit: Payer: Self-pay | Admitting: Family Medicine

## 2024-07-19 ENCOUNTER — Other Ambulatory Visit (HOSPITAL_COMMUNITY): Payer: Self-pay

## 2024-07-19 DIAGNOSIS — E119 Type 2 diabetes mellitus without complications: Secondary | ICD-10-CM

## 2024-07-27 ENCOUNTER — Other Ambulatory Visit: Payer: Self-pay | Admitting: Family Medicine

## 2024-07-29 ENCOUNTER — Other Ambulatory Visit (HOSPITAL_COMMUNITY): Payer: Self-pay

## 2024-07-29 ENCOUNTER — Telehealth: Payer: Self-pay

## 2024-07-29 NOTE — Telephone Encounter (Signed)
 Pharmacy Patient Advocate Encounter   Received notification from [facility] that prior authorization for DEXCOM G6 TRANSMITTER is required/requested.   Insurance verification completed.   The patient is insured through [place].   PA required; PA submitted to above mentioned insurance via Latent Key/confirmation #/EOC [id number]. Status is pending  No chart notes to attach. Patient may need an appt.

## 2024-07-30 NOTE — Telephone Encounter (Signed)
 Pharmacy Patient Advocate Encounter  Received notification from [employer] that Prior Authorization for [facility] G6 SENSOR has been APPROVED from 07/22/24 to 07/21/25   PA #/Case ID/Reference #: [id number]

## 2024-08-11 ENCOUNTER — Encounter: Payer: Self-pay | Admitting: Family Medicine

## 2024-08-11 ENCOUNTER — Ambulatory Visit: Payer: Self-pay | Admitting: Family Medicine

## 2024-08-11 VITALS — BP 106/81 | HR 100 | Ht 64.0 in | Wt 177.3 lb

## 2024-08-11 DIAGNOSIS — M545 Low back pain, unspecified: Secondary | ICD-10-CM

## 2024-08-11 DIAGNOSIS — E119 Type 2 diabetes mellitus without complications: Secondary | ICD-10-CM

## 2024-08-11 DIAGNOSIS — I10 Essential (primary) hypertension: Secondary | ICD-10-CM | POA: Diagnosis not present

## 2024-08-11 DIAGNOSIS — G8929 Other chronic pain: Secondary | ICD-10-CM | POA: Diagnosis not present

## 2024-08-11 LAB — POCT GLYCOSYLATED HEMOGLOBIN (HGB A1C): HbA1c, POC (controlled diabetic range): 7.5 % — AB (ref 0.0–7.0)

## 2024-08-11 MED ORDER — LANTUS SOLOSTAR 100 UNIT/ML ~~LOC~~ SOPN: 12.0000 [IU] | PEN_INJECTOR | Freq: Every day | SUBCUTANEOUS | 0 refills | Status: AC
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
## 2024-08-11 NOTE — Assessment & Plan Note (Addendum)
 A1c 7.5 from 7.7. On mounjaro  15 mg weekly and lantus  10 units daily at bedtime. Has gained 10 lbs from October of last year likely due to eating more sweets and eating more food during the holidays.  - BMP  - Continue mounjaro  15 mg weekly  - Continue lantus  12 units daily at bedtime  - Continue rosuvastatin  20 mg daily

## 2024-08-11 NOTE — Assessment & Plan Note (Addendum)
 Controlled at this time.  - Continue losartan  50 mg - BMP

## 2024-08-11 NOTE — Patient Instructions (Addendum)
 It was wonderful to see you today.  Please bring ALL of your medications with you to every visit.   Continue your current diabetes treatment. Make a follow up with Dr. Koval in 3 months.   We are going to check your kidney function today.   For your back pain I ordered an xray. You can go to the hospital and get this done. You do not need an appointment. I have also referred you to sports medicine center.   Call to schedule an eye appointment.  [facility] [address],  [place], [place] [address] [phone]  You need a mammogram to prevent breast cancer.  Please schedule an appointment.  You can call [phone].    You are not due for a pap smear until 2029. You are not due for a colonoscopy until 2031.   Thank you for choosing [facility].   Please call [phone] with any questions about today's appointment.  Please be sure to schedule follow up at the front desk before you leave today.   Areta Saliva, MD  Family Medicine

## 2024-08-12 LAB — BASIC METABOLIC PANEL WITH GFR
BUN/Creatinine Ratio: 14 (ref 9–23)
BUN: 17 mg/dL (ref 6–24)
CO2: 24 mmol/L (ref 20–29)
Calcium: 9.4 mg/dL (ref 8.7–10.2)
Chloride: 103 mmol/L (ref 96–106)
Creatinine, Ser: 1.19 mg/dL — ABNORMAL HIGH (ref 0.57–1.00)
Glucose: 138 mg/dL — ABNORMAL HIGH (ref 70–99)
Potassium: 4.6 mmol/L (ref 3.5–5.2)
Sodium: 142 mmol/L (ref 134–144)
eGFR: 54 mL/min/1.73 — ABNORMAL LOW

## 2024-08-12 NOTE — Assessment & Plan Note (Signed)
 Most likely due to spinal stenosis or arthritis or muscular due to weak core muscles. - Recommended lidocaine  patches and heating pad - Recommended physical therapy patient declined at this time. - Lumbar x-rays - Referral to sports medicine.

## 2024-08-13 ENCOUNTER — Ambulatory Visit (INDEPENDENT_AMBULATORY_CARE_PROVIDER_SITE_OTHER)

## 2024-08-13 ENCOUNTER — Ambulatory Visit (HOSPITAL_BASED_OUTPATIENT_CLINIC_OR_DEPARTMENT_OTHER): Admission: RE | Admit: 2024-08-13 | Discharge: 2024-08-13 | Disposition: A | Source: Ambulatory Visit

## 2024-08-13 ENCOUNTER — Ambulatory Visit: Payer: Self-pay | Admitting: Family Medicine

## 2024-08-13 VITALS — BP 117/71 | Ht 64.0 in | Wt 177.0 lb

## 2024-08-13 DIAGNOSIS — E119 Type 2 diabetes mellitus without complications: Secondary | ICD-10-CM

## 2024-08-13 DIAGNOSIS — M545 Low back pain, unspecified: Secondary | ICD-10-CM | POA: Diagnosis present

## 2024-08-13 NOTE — Telephone Encounter (Signed)
 Called patient in regards to lab results.  Creatinine was 1.19 compared to baseline of 0.8 range.  Patient has not been taking NSAIDs and has not been ill recently.  Advised patient to hydrate and made lab appointment for repeat BMP.

## 2024-08-13 NOTE — Progress Notes (Signed)
 "  Subjective:    Cathy Bond ID: Cathy Bond, female    DOB: 54 y.o., 04-06-1971   MRN: [id number]  Chief Complaint: Low back pain  Discussed the use of AI scribe software for clinical note transcription with the Cathy Bond, who gave verbal consent to proceed.  History of Present Illness Cathy Bond is a 54 year old female with past medical history significant for hypertension, type 2 diabetes (last A1c 7.5 on 08/11/2024), bipolar disorder presenting for evaluation of low back pain.  Evaluated by her primary care physician on 08/12/2024 and the source of her pain was deemed to most likely be muscular weakness, arthritis, or spinal stenosis.  Cathy Bond declined physical therapy at that time but had been seeing her chiropractor.  Low Back Pain: - Present for approximately 17 years with gradual worsening - Pain is intermittent, occasionally severe enough to disrupt sleep - Localized to the lumbar region with increased sensitivity - Exacerbated by bending, lifting, and certain movements - Walking induces cramping; partial relief achieved by leaning forward while walking - Supine positioning is more comfortable than lying on her side - No history of lumbar surgery or major back trauma  Associated Neurological Symptoms: - No numbness, paresthesia, radicular pain, bowel or bladder dysfunction, or weakness - No fevers, chills, or unintentional weight loss  Prior Imaging and Evaluation: - Lumbar x-rays several years ago reportedly demonstrated arthritis - Recent x-rays obtained by chiropractor showed spinal alignment and hip/shoulder asymmetry (images not available) - No recent imaging available in the chart  Current and Prior Treatments: - Uses Tylenol  Arthritis, two tablets at a time (dose unknown) - Avoids NSAIDs other than aspirin  due to history of peptic ulcer disease with bleeding - Has considered using a back brace but has not tried one  Review of Pertinent Imaging: Lumbar spinal plain films  ordered by PCP but not yet obtained    Objective:   Vitals:   08/13/24 0907  BP: 117/71   Lumbar Spine -Inspection: no swelling or skin changes -Palpation: TTP - midline, + paraspinals, + left SI joint -AROM/PROM: FROM in all planes of the low back -Strength: full hip flexion (L1/L2), knee extension (L3/4), ankle dorsiflexion (L4/5), hip extension (L5/S1), knee flexion (L5/S1/S2) plantarflexion (S1/2). -Sensation: intact sensation over the medial femoral condyle (L3), patella (L4), lateral femoral condyle (L5), lateral malleolus (S1). -Reflexes: normal patellar (L3/4), hamstring (L5/S1), achilles (S1/2) reflexes, equal bilaterally -Special tests: - Straight Leg Raise, + Stork, - Slump test   2 view plain film radiographs obtained of the lumbar spine today per my independent evaluation revealing mild anterolisthesis of L5 on S1 (grade 1), Facet joint hypertrophy present greatest at L4-L5 and L5-S1.  No significant lumbar scoliotic curve.  No acute osseous abnormalities.  Assessment & Plan:   Assessment & Plan Chronic low back pain Chronic low back pain has progressively worsened over 17 years, affecting sleep and daily function.  Based on her gait, she does appear to adopt a slightly slumped over/guarded posture which she states is for comfort out of proportion with what I would expect from a normal 54 year old.  This does raise my concern for neuro claudication or central canal stenosis.  Examination shows no significant neurological deficits or spinal deformity. Family history includes bone cancer and scoliosis, but there is no clinical evidence of these conditions. Core weakness and poor posture likely contribute to her symptoms. Due to a history of bleeding ulcers, oral NSAIDs are contraindicated, so topical therapy is preferred. Lumbar spine x-rays are ordered to evaluate  structural abnormalities and establish baseline imaging. She received a sample of Voltaren  (diclofenac ) gel and is  advised to use over-the-counter Voltaren  gel for topical analgesia. Scheduled acetaminophen  (Tylenol  Arthritis), two tablets in the morning and evening, is recommended. She is referred to physical therapy for core strengthening and posture correction. A lumbar support pillow is advised for her car seat to improve posture and reduce pain during driving. A six-week follow-up is scheduled to assess response to interventions and review imaging results. Instructions for pharmacy and radiology locations within the building were provided.   "

## 2024-08-18 ENCOUNTER — Other Ambulatory Visit: Payer: Self-pay

## 2024-08-18 DIAGNOSIS — E119 Type 2 diabetes mellitus without complications: Secondary | ICD-10-CM

## 2024-08-19 ENCOUNTER — Ambulatory Visit: Payer: Self-pay | Admitting: Family Medicine

## 2024-08-19 LAB — BASIC METABOLIC PANEL WITH GFR
BUN/Creatinine Ratio: 13 (ref 9–23)
BUN: 13 mg/dL (ref 6–24)
CO2: 26 mmol/L (ref 20–29)
Calcium: 9.7 mg/dL (ref 8.7–10.2)
Chloride: 101 mmol/L (ref 96–106)
Creatinine, Ser: 0.97 mg/dL (ref 0.57–1.00)
Glucose: 134 mg/dL — ABNORMAL HIGH (ref 70–99)
Potassium: 4.2 mmol/L (ref 3.5–5.2)
Sodium: 138 mmol/L (ref 134–144)
eGFR: 69 mL/min/{1.73_m2}

## 2024-09-24 ENCOUNTER — Ambulatory Visit
